# Patient Record
Sex: Female | Born: 1945
Health system: Southern US, Community
[De-identification: ages and names within clinical notes are randomized; demographics above are authoritative.]

## PROBLEM LIST (undated history)

## (undated) DIAGNOSIS — R0602 Shortness of breath: Secondary | ICD-10-CM

## (undated) DIAGNOSIS — F329 Major depressive disorder, single episode, unspecified: Secondary | ICD-10-CM

## (undated) DIAGNOSIS — R569 Unspecified convulsions: Secondary | ICD-10-CM

## (undated) DIAGNOSIS — R011 Cardiac murmur, unspecified: Secondary | ICD-10-CM

## (undated) DIAGNOSIS — K219 Gastro-esophageal reflux disease without esophagitis: Secondary | ICD-10-CM

## (undated) DIAGNOSIS — R112 Nausea with vomiting, unspecified: Secondary | ICD-10-CM

## (undated) DIAGNOSIS — T4145XA Adverse effect of unspecified anesthetic, initial encounter: Secondary | ICD-10-CM

## (undated) DIAGNOSIS — I2699 Other pulmonary embolism without acute cor pulmonale: Secondary | ICD-10-CM

## (undated) DIAGNOSIS — G709 Myoneural disorder, unspecified: Secondary | ICD-10-CM

## (undated) DIAGNOSIS — M1990 Unspecified osteoarthritis, unspecified site: Secondary | ICD-10-CM

## (undated) DIAGNOSIS — Z8719 Personal history of other diseases of the digestive system: Secondary | ICD-10-CM

## (undated) DIAGNOSIS — G629 Polyneuropathy, unspecified: Secondary | ICD-10-CM

## (undated) DIAGNOSIS — I82409 Acute embolism and thrombosis of unspecified deep veins of unspecified lower extremity: Secondary | ICD-10-CM

## (undated) DIAGNOSIS — G2581 Restless legs syndrome: Secondary | ICD-10-CM

## (undated) DIAGNOSIS — F32A Depression, unspecified: Secondary | ICD-10-CM

## (undated) DIAGNOSIS — T8859XA Other complications of anesthesia, initial encounter: Secondary | ICD-10-CM

## (undated) DIAGNOSIS — R52 Pain, unspecified: Secondary | ICD-10-CM

## (undated) DIAGNOSIS — Z9889 Other specified postprocedural states: Secondary | ICD-10-CM

## (undated) DIAGNOSIS — I739 Peripheral vascular disease, unspecified: Secondary | ICD-10-CM

## (undated) DIAGNOSIS — M419 Scoliosis, unspecified: Secondary | ICD-10-CM

## (undated) DIAGNOSIS — F419 Anxiety disorder, unspecified: Secondary | ICD-10-CM

## (undated) DIAGNOSIS — M722 Plantar fascial fibromatosis: Secondary | ICD-10-CM

## (undated) DIAGNOSIS — E785 Hyperlipidemia, unspecified: Secondary | ICD-10-CM

## (undated) HISTORY — PX: KNEE ARTHROSCOPY: SUR90

## (undated) HISTORY — PX: COLONOSCOPY: SHX174

## (undated) HISTORY — PX: JOINT REPLACEMENT: SHX530

## (undated) HISTORY — PX: OTHER SURGICAL HISTORY: SHX169

## (undated) HISTORY — PX: TUBAL LIGATION: SHX77

## (undated) HISTORY — PX: ABDOMINAL HYSTERECTOMY: SHX81

## (undated) HISTORY — PX: EYE SURGERY: SHX253

## (undated) HISTORY — PX: EXTERNAL EAR SURGERY: SHX627

---

## 1968-05-14 DIAGNOSIS — R011 Cardiac murmur, unspecified: Secondary | ICD-10-CM

## 1968-05-14 HISTORY — DX: Cardiac murmur, unspecified: R01.1

## 2006-05-14 DIAGNOSIS — I2699 Other pulmonary embolism without acute cor pulmonale: Secondary | ICD-10-CM

## 2006-05-14 DIAGNOSIS — I82409 Acute embolism and thrombosis of unspecified deep veins of unspecified lower extremity: Secondary | ICD-10-CM

## 2006-05-14 HISTORY — DX: Other pulmonary embolism without acute cor pulmonale: I26.99

## 2006-05-14 HISTORY — DX: Acute embolism and thrombosis of unspecified deep veins of unspecified lower extremity: I82.409

## 2012-01-23 DIAGNOSIS — R05 Cough: Secondary | ICD-10-CM | POA: Insufficient documentation

## 2012-01-23 DIAGNOSIS — F419 Anxiety disorder, unspecified: Secondary | ICD-10-CM | POA: Insufficient documentation

## 2012-01-23 DIAGNOSIS — E7800 Pure hypercholesterolemia, unspecified: Secondary | ICD-10-CM | POA: Insufficient documentation

## 2012-01-23 DIAGNOSIS — K219 Gastro-esophageal reflux disease without esophagitis: Secondary | ICD-10-CM

## 2012-01-23 DIAGNOSIS — F329 Major depressive disorder, single episode, unspecified: Secondary | ICD-10-CM | POA: Insufficient documentation

## 2012-01-23 DIAGNOSIS — K449 Diaphragmatic hernia without obstruction or gangrene: Secondary | ICD-10-CM | POA: Insufficient documentation

## 2012-02-12 ENCOUNTER — Other Ambulatory Visit: Payer: Self-pay | Admitting: Orthopedic Surgery

## 2012-02-12 MED ORDER — BUPIVACAINE 0.25 % ON-Q PUMP SINGLE CATH 300ML: 300.0000 mL | INJECTION | Status: DC

## 2012-02-12 MED ORDER — DEXAMETHASONE SODIUM PHOSPHATE 10 MG/ML IJ SOLN: 10.0000 mg | Freq: Once | INTRAMUSCULAR | Status: DC

## 2012-02-12 NOTE — Progress Notes (Signed)
Preoperative surgical orders have been place into the Epic hospital system for [facility] on 02/12/2012, 8:38 PM  by Patrica Duel for surgery on 04/09/12.  Preop Revision Total Knee orders including Bupivacaine On-Q pump, IV Tylenol, and IV Decadron as long as there are no contraindications to the above medications. Avel Peace, PA-C

## 2012-04-13 HISTORY — PX: NISSEN FUNDOPLICATION: SHX2091

## 2012-05-19 ENCOUNTER — Other Ambulatory Visit: Payer: Self-pay | Admitting: Orthopedic Surgery

## 2012-05-19 MED ORDER — BUPIVACAINE LIPOSOME 1.3 % IJ SUSP: 20.0000 mL | Freq: Once | INTRAMUSCULAR | Status: DC

## 2012-05-19 MED ORDER — DEXAMETHASONE SODIUM PHOSPHATE 10 MG/ML IJ SOLN: 10.0000 mg | Freq: Once | INTRAMUSCULAR | Status: DC

## 2012-05-19 NOTE — Progress Notes (Signed)
Preoperative surgical orders have been place into the Epic hospital system for [facility] on 05/19/2012, 11:07 AM  by Patrica Duel for surgery on 07/02/2012.  Preop Revision Total Knee orders including Experal, IV Tylenol, and IV Decadron as long as there are no contraindications to the above medications. Avel Peace, PA-C

## 2012-05-21 DIAGNOSIS — Z8719 Personal history of other diseases of the digestive system: Secondary | ICD-10-CM | POA: Insufficient documentation

## 2012-05-21 DIAGNOSIS — Z9889 Other specified postprocedural states: Secondary | ICD-10-CM

## 2012-06-23 ENCOUNTER — Encounter (HOSPITAL_COMMUNITY): Payer: Self-pay | Admitting: Pharmacy Technician

## 2012-06-25 ENCOUNTER — Encounter (HOSPITAL_COMMUNITY)
Admission: RE | Admit: 2012-06-25 | Discharge: 2012-06-25 | Disposition: A | Discharge status: Home / Self Care | Payer: Medicare Other | Source: Ambulatory Visit | Attending: Orthopedic Surgery | Admitting: Orthopedic Surgery

## 2012-06-25 ENCOUNTER — Encounter (HOSPITAL_COMMUNITY): Payer: Self-pay

## 2012-06-25 HISTORY — DX: Other complications of anesthesia, initial encounter: T88.59XA

## 2012-06-25 HISTORY — DX: Peripheral vascular disease, unspecified: I73.9

## 2012-06-25 HISTORY — DX: Other pulmonary embolism without acute cor pulmonale: I26.99

## 2012-06-25 HISTORY — DX: Myoneural disorder, unspecified: G70.9

## 2012-06-25 HISTORY — DX: Anxiety disorder, unspecified: F41.9

## 2012-06-25 HISTORY — DX: Adverse effect of unspecified anesthetic, initial encounter: T41.45XA

## 2012-06-25 HISTORY — DX: Other specified postprocedural states: Z98.890

## 2012-06-25 HISTORY — DX: Nausea with vomiting, unspecified: R11.2

## 2012-06-25 HISTORY — DX: Cardiac murmur, unspecified: R01.1

## 2012-06-25 HISTORY — DX: Unspecified osteoarthritis, unspecified site: M19.90

## 2012-06-25 HISTORY — DX: Acute embolism and thrombosis of unspecified deep veins of unspecified lower extremity: I82.409

## 2012-06-25 HISTORY — DX: Hyperlipidemia, unspecified: E78.5

## 2012-06-25 HISTORY — DX: Depression, unspecified: F32.A

## 2012-06-25 HISTORY — DX: Gastro-esophageal reflux disease without esophagitis: K21.9

## 2012-06-25 HISTORY — DX: Major depressive disorder, single episode, unspecified: F32.9

## 2012-06-25 HISTORY — DX: Personal history of other diseases of the digestive system: Z87.19

## 2012-06-25 HISTORY — DX: Unspecified convulsions: R56.9

## 2012-06-25 LAB — COMPREHENSIVE METABOLIC PANEL
ALT: 18 U/L (ref 0–35)
Alkaline Phosphatase: 89 U/L (ref 39–117)
BUN: 8 mg/dL (ref 6–23)
CO2: 29 mEq/L (ref 19–32)
Calcium: 9.1 mg/dL (ref 8.4–10.5)
GFR calc Af Amer: >90 mL/min (ref >90)
GFR calc non Af Amer: >90 mL/min (ref >90)
Glucose, Bld: 70 mg/dL (ref 70–99)
Sodium: 137 mEq/L (ref 135–145)

## 2012-06-25 LAB — CBC
HCT: 38.5 % (ref 36.0–46.0)
Hemoglobin: 12.2 g/dL (ref 12.0–15.0)
MCH: 28 pg (ref 26.0–34.0)
RBC: 4.35 MIL/uL (ref 3.87–5.11)

## 2012-06-25 LAB — SURGICAL PCR SCREEN
MRSA, PCR: NEGATIVE
Staphylococcus aureus: POSITIVE — AB

## 2012-06-25 LAB — URINALYSIS, ROUTINE W REFLEX MICROSCOPIC
Glucose, UA: NEGATIVE mg/dL
Hgb urine dipstick: NEGATIVE
Specific Gravity, Urine: 1.012 (ref 1.005–1.030)

## 2012-06-25 LAB — PROTIME-INR: Prothrombin Time: 12.1 seconds (ref 11.6–15.2)

## 2012-06-25 NOTE — Patient Instructions (Addendum)
20 Carrie Keller  06/25/2012   Your procedure is scheduled on:  07/02/12  [facility]  Report to [facility] Stay Center at 0600      AM.  Call this number if you have problems the morning of surgery: [phone]       Remember:   Do not eat food  Or drink :After Midnight. Tuesday NIGHT   Take these medicines the morning of surgery with A SIP OF WATER:  Cymbalta,   .  Contacts, dentures or partial plates can not be worn to surgery  Leave suitcase in the car. After surgery it may be brought to your room.  For patients admitted to the hospital, checkout time is 11:00 AM day of  discharge.             SPECIAL INSTRUCTIONS- SEE [facility] PREPARING FOR SURGERY INSTRUCTION SHEET-     DO NOT WEAR JEWELRY, LOTIONS, POWDERS, OR PERFUMES.  WOMEN-- DO NOT SHAVE LEGS OR UNDERARMS FOR 12 HOURS BEFORE SHOWERS. MEN MAY SHAVE FACE.  Patients discharged the day of surgery will not be allowed to drive home. IF going home the day of surgery, you must have a driver and someone to stay with you for the first 24 hours  Name and phone number of your driver: husband   BEN                                                                       Please read over the following fact sheets that you were given: MRSA Information, Incentive Spirometry Sheet, Blood Transfusion Sheet  Information                                                                                   Carrie Keller  PST 336  [phone]                 FAILURE TO FOLLOW THESE INSTRUCTIONS MAY RESULT IN  CANCELLATION   OF YOUR SURGERY                                                  Patient Signature _____________________________

## 2012-06-25 NOTE — Progress Notes (Signed)
Clearance 11/13 with EK 10/13  chart

## 2012-06-25 NOTE — Pre-Procedure Instructions (Signed)
PT'S MUPIROCIN PRESCRIPTION CALLED IN TO [facility] [place] [phone] PER PT'S REQUEST.

## 2012-06-29 ENCOUNTER — Other Ambulatory Visit: Payer: Self-pay | Admitting: Orthopedic Surgery

## 2012-06-29 NOTE — H&P (Signed)
Carrie Keller  DOB: 05/09/1946 Married / Language: English / Race: White Female  Date of Admission:  07/02/2012  Chief Complaint:  Unstable Right Total Knee Arthroplasty  History of Present Illness The patient is a 66 year old female who comes in for a preoperative History and Physical. The patient is scheduled for a right total knee arthroplasty to be performed by Dr. Frank V. Aluisio, MD at [facility] on 07/02/2012. The patient is a 66 year old female who presents for follow up of their knee. The patient is being followed for their bilateral painful total knees. Symptoms reported today include: pain. The patient feels that they are doing poorly and report their pain level to be moderate to severe. The following medication has been used for pain control: none (She had surgery in December and has not yet been allowed to take anything other than Tylenol which doesn't really help). The patient indicates that they are ready regarding total knee revision.  She states she has pain in both knees anytime she sits for a period of time. She also has difficulty getting up from a seated position. Unstable right total knee. The brace helped with some symptoms. She does not feel like she can go on with the brace forever. It has not completely gotten rid of her problems either. She said when she wears the brace she tends to do better. Without the brace the knee feels very unstable. The brace is very cumbersome and she would like to be in a situation where she would not need to use it anymore. She is ready to proceed with revision surgery. They have been treated conservatively in the past for the above stated problem and despite conservative measures, they continue to have progressive pain and severe functional limitations and dysfunction. They have failed non-operative management including home exercise, medications, and injections. It is felt that they would benefit from undergoing total joint  replacement. Risks and benefits of the procedure have been discussed with the patient and they elect to proceed with surgery. There are no active contraindications to surgery such as ongoing infection or rapidly progressive neurological disease.   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 seizures    Family History Cancer. mother and father Congestive Heart Failure. father Depression. mother and father Diabetes Mellitus. father Heart Disease. mother and father Heart disease in female family member before age 65 Kidney disease. father Osteoarthritis. father Rheumatoid Arthritis. mother   Social History Tobacco use. Former smoker. former smoker; uses 1 1/2 can(s) smokeless per week Alcohol use. current drinker; drinks wine and hard liquor; only occasionally per week Children. 1 Current work status. retired Drug/Alcohol Rehab (Currently). no Drug/Alcohol Rehab (Previously). no Exercise. Exercises weekly; does running / walking Illicit drug use. no Living situation. live with spouse Marital status. married Number of flights of stairs before winded. 1 Pain Contract. no Tobacco / smoke exposure. no Post-Surgical Plans. Plan is to go home.   Medication History Multivitamins ( Oral) Active. Calcium Carbonate ( Oral) Specific dose unknown - Active. Magnesium Oxide (400MG Capsule, Oral) Active. TraZODone HCl (50MG Tablet, Oral) Active. Estradiol (2MG Tablet, Oral) Active. Cymbalta (60MG Capsule DR Part, Oral) Active. Crestor (10MG Tablet, Oral) Active. Pramipexole Dihydrochloride (1MG Tablet, Oral) Active. QUEtiapine Fumarate (50MG Tablet, Oral) Active.   Past Surgical History Arthroscopy of Knee. right Carpal Tunnel Repair. bilateral Total Knee Replacement. bilateral Tubal Ligation Surgery for Bell's Palsy Abdominal Surgery. Abdominoplasty Nissen Fudoplication   Medical  History   Anxiety Disorder Blood Clot. DVT and Pulm. Emboli Chronic Pain Depression Gastroesophageal Reflux Disease Hypercholesterolemia Osteoarthritis Diverticulosis   Review of Systems General:Not Present- Chills, Fever, Night Sweats, Fatigue, Weight Gain, Weight Loss and Memory Loss. Skin:Not Present- Hives, Itching, Rash, Eczema and Lesions. HEENT:Not Present- Tinnitus, Headache, Double Vision, Visual Loss, Hearing Loss and Dentures. Respiratory:Not Present- Shortness of breath with exertion, Shortness of breath at rest, Allergies, Coughing up blood and Chronic Cough. Cardiovascular:Present- Leg Cramps. Not Present- Shortness of Breath, Chest Pain, Swelling of Extremities, Racing/skipping heartbeats, Difficulty Breathing Lying Down, Murmur, Swelling and Palpitations. Gastrointestinal:Present- Heartburn. Not Present- Bloody Stool, Abdominal Pain, Vomiting, Nausea, Constipation, Diarrhea, Difficulty Swallowing, Incontinence of Stool, Jaundice and Loss of appetitie. Female Genitourinary:Not Present- Blood in Urine, Urinary frequency, Weak urinary stream, Discharge, Flank Pain, Incontinence, Painful Urination, Urgency, Urinary Retention and Urinating at Night. Musculoskeletal:Present- Joint Pain. Not Present- Muscle Weakness, Muscle Pain, Joint Stiffness, Joint Swelling, Back Pain, Morning Stiffness and Spasms. Neurological:Present- Headaches. Not Present- Tingling, Numbness, Burning, Tremor, Dizziness, Blackout spells, Paralysis, Difficulty with balance and Weakness. Psychiatric:Present- Anxiety. Not Present- Depression, Memory Loss and Insomnia. Endocrine:Present- Heat Intolerance. Not Present- Cold Intolerance, Excessive hunger and Excessive Thirst. Hematology:Present- Blood Clots. Not Present- Abnormal Bleeding, Anemia and Easy Bruising.   Vitals Weight: 169 lb Height: 61.5 in Weight was reported by patient. Height was reported by patient. Body Surface Area: 1.82  m Body Mass Index: 31.41 kg/m Pulse: 96 (Regular) Resp.: 14 (Unlabored) BP: 122/78 (Sitting, Right Arm, Standard)    Physical Exam The physical exam findings are as follows:  Note: Patient is a 66 year old female with continued knee pain.   General Mental Status - Alert, cooperative and good historian. General Appearance- pleasant. Not in acute distress. Orientation- Oriented X3. Build & Nutrition- Well nourished and Well developed.   Head and Neck Head- normocephalic, atraumatic . Neck Global Assessment- supple. no bruit auscultated on the right and no bruit auscultated on the left.   Eye Pupil- Bilateral- Regular and Round. Motion- Bilateral- EOMI.   Chest and Lung Exam Auscultation: Breath sounds:- clear at anterior chest wall and - clear at posterior chest wall. Adventitious sounds:- No Adventitious sounds.   Cardiovascular Auscultation:Rhythm- Regular rate and rhythm. Heart Sounds- S1 WNL and S2 WNL. Murmurs & Other Heart Sounds:Auscultation of the heart reveals - No Murmurs.   Abdomen Palpation/Percussion:Tenderness- Abdomen is non-tender to palpation. Rigidity (guarding)- Abdomen is soft. Auscultation:Auscultation of the abdomen reveals - Bowel sounds normal.   Female Genitourinary Not done, not pertinent to present illness  Musculoskeletal Well developed female alert and oriented in no apparent distress. Right knee shows range about 5 to 110. She has significant laxity in extension as well as significant AP laxity in 90 degrees of flexion. No specific tenderness. Her left knee range 0 to 120. No swelling, tenderness or instability.  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 Aluisio.  Plan is to go home.  PCP - Dr. Brad Thomas ([place], )  Signed electronically by DREW L Jyasia Markoff, PA-C  

## 2012-07-01 NOTE — Anesthesia Preprocedure Evaluation (Addendum)
Anesthesia Evaluation  Patient identified by MRN, date of birth, ID band Patient awake    Reviewed: Allergy & Precautions, H&P , NPO status , Patient's Chart, lab work & pertinent test results  History of Anesthesia Complications (+) PONV  Airway Mallampati: II TM Distance: >3 FB Neck ROM: full    Dental no notable dental hx. (+) Teeth Intact and Dental Advisory Given   Pulmonary neg pulmonary ROS,  Hx. PE breath sounds clear to auscultation  Pulmonary exam normal       Cardiovascular Exercise Tolerance: Good negative cardio ROS  + Valvular Problems/Murmurs MVP Rhythm:regular Rate:Normal     Neuro/Psych negative neurological ROS  negative psych ROS   GI/Hepatic negative GI ROS, Neg liver ROS,   Endo/Other  negative endocrine ROS  Renal/GU negative Renal ROS  negative genitourinary   Musculoskeletal   Abdominal   Peds  Hematology negative hematology ROS (+)   Anesthesia Other Findings   Reproductive/Obstetrics negative OB ROS                          Anesthesia Physical Anesthesia Plan  ASA: II  Anesthesia Plan: General   Post-op Pain Management:    Induction: Intravenous  Airway Management Planned: Oral ETT  Additional Equipment:   Intra-op Plan:   Post-operative Plan: Extubation in OR  Informed Consent: I have reviewed the patients History and Physical, chart, labs and discussed the procedure including the risks, benefits and alternatives for the proposed anesthesia with the patient or authorized representative who has indicated his/her understanding and acceptance.   Dental Advisory Given  Plan Discussed with: CRNA and Surgeon  Anesthesia Plan Comments:        Anesthesia Quick Evaluation

## 2012-07-02 ENCOUNTER — Encounter (HOSPITAL_COMMUNITY): Payer: Self-pay | Admitting: Anesthesiology

## 2012-07-02 ENCOUNTER — Encounter (HOSPITAL_COMMUNITY): Payer: Self-pay | Admitting: *Deleted

## 2012-07-02 ENCOUNTER — Inpatient Hospital Stay (HOSPITAL_COMMUNITY)
Admission: RE | Admit: 2012-07-02 | Discharge: 2012-07-04 | DRG: 468 | Disposition: A | Discharge status: Home Health | Payer: Medicare Other | Source: Ambulatory Visit | Attending: Orthopedic Surgery | Admitting: Orthopedic Surgery

## 2012-07-02 ENCOUNTER — Inpatient Hospital Stay (HOSPITAL_COMMUNITY): Payer: Medicare Other | Admitting: Anesthesiology

## 2012-07-02 ENCOUNTER — Encounter (HOSPITAL_COMMUNITY)
Admission: RE | Disposition: A | Discharge status: Home Health | Payer: Self-pay | Source: Ambulatory Visit | Attending: Orthopedic Surgery

## 2012-07-02 DIAGNOSIS — Z86711 Personal history of pulmonary embolism: Secondary | ICD-10-CM

## 2012-07-02 DIAGNOSIS — T84018A Broken internal joint prosthesis, other site, initial encounter: Secondary | ICD-10-CM

## 2012-07-02 DIAGNOSIS — E7800 Pure hypercholesterolemia, unspecified: Secondary | ICD-10-CM | POA: Diagnosis present

## 2012-07-02 DIAGNOSIS — T84099A Other mechanical complication of unspecified internal joint prosthesis, initial encounter: Principal | ICD-10-CM | POA: Diagnosis present

## 2012-07-02 DIAGNOSIS — I739 Peripheral vascular disease, unspecified: Secondary | ICD-10-CM | POA: Diagnosis present

## 2012-07-02 DIAGNOSIS — F329 Major depressive disorder, single episode, unspecified: Secondary | ICD-10-CM | POA: Diagnosis present

## 2012-07-02 DIAGNOSIS — Z96659 Presence of unspecified artificial knee joint: Secondary | ICD-10-CM

## 2012-07-02 DIAGNOSIS — F3289 Other specified depressive episodes: Secondary | ICD-10-CM | POA: Diagnosis present

## 2012-07-02 DIAGNOSIS — Z79899 Other long term (current) drug therapy: Secondary | ICD-10-CM

## 2012-07-02 DIAGNOSIS — Y831 Surgical operation with implant of artificial internal device as the cause of abnormal reaction of the patient, or of later complication, without mention of misadventure at the time of the procedure: Secondary | ICD-10-CM | POA: Diagnosis present

## 2012-07-02 DIAGNOSIS — D62 Acute posthemorrhagic anemia: Secondary | ICD-10-CM

## 2012-07-02 DIAGNOSIS — F411 Generalized anxiety disorder: Secondary | ICD-10-CM | POA: Diagnosis present

## 2012-07-02 DIAGNOSIS — K219 Gastro-esophageal reflux disease without esophagitis: Secondary | ICD-10-CM | POA: Diagnosis present

## 2012-07-02 DIAGNOSIS — Z86718 Personal history of other venous thrombosis and embolism: Secondary | ICD-10-CM

## 2012-07-02 HISTORY — PX: TOTAL KNEE REVISION: SHX996

## 2012-07-02 LAB — TYPE AND SCREEN: ABO/RH(D): O POS

## 2012-07-02 SURGERY — TOTAL KNEE REVISION
Anesthesia: Spinal | Site: Knee | Laterality: Right | Wound class: Clean

## 2012-07-02 MED ORDER — SODIUM CHLORIDE 0.9 % IJ SOLN
INTRAMUSCULAR | Status: DC | PRN
  Administered 2012-07-02: 50 mL

## 2012-07-02 MED ORDER — LACTATED RINGERS IV SOLN
INTRAVENOUS | Status: DC | PRN
  Administered 2012-07-02 (×2): via INTRAVENOUS

## 2012-07-02 MED ORDER — PRAMIPEXOLE DIHYDROCHLORIDE 1 MG PO TABS
1.0000 mg | ORAL_TABLET | Freq: Every day | ORAL | Status: DC
  Administered 2012-07-02 – 2012-07-03 (×2): 1 mg via ORAL
  Filled 2012-07-02 (×3): qty 1

## 2012-07-02 MED ORDER — DEXAMETHASONE SODIUM PHOSPHATE 10 MG/ML IJ SOLN
INTRAMUSCULAR | Status: DC | PRN
  Administered 2012-07-02: 10 mg via INTRAVENOUS

## 2012-07-02 MED ORDER — BUPIVACAINE LIPOSOME 1.3 % IJ SUSP
20.0000 mL | Freq: Once | INTRAMUSCULAR | Status: AC
  Administered 2012-07-02: 20 mL
  Filled 2012-07-02: qty 20

## 2012-07-02 MED ORDER — SODIUM CHLORIDE 0.9 % IR SOLN
Status: DC | PRN
  Administered 2012-07-02: 1000 mL

## 2012-07-02 MED ORDER — QUETIAPINE FUMARATE 50 MG PO TABS
50.0000 mg | ORAL_TABLET | Freq: Every day | ORAL | Status: DC
  Administered 2012-07-02 – 2012-07-03 (×2): 50 mg via ORAL
  Filled 2012-07-02 (×3): qty 1

## 2012-07-02 MED ORDER — ONDANSETRON HCL 4 MG/2ML IJ SOLN: 4.0000 mg | Freq: Four times a day (QID) | INTRAMUSCULAR | Status: DC | PRN

## 2012-07-02 MED ORDER — BISACODYL 10 MG RE SUPP: 10.0000 mg | Freq: Every day | RECTAL | Status: DC | PRN

## 2012-07-02 MED ORDER — ACETAMINOPHEN 650 MG RE SUPP: 650.0000 mg | Freq: Four times a day (QID) | RECTAL | Status: DC | PRN

## 2012-07-02 MED ORDER — SUCCINYLCHOLINE CHLORIDE 20 MG/ML IJ SOLN
INTRAMUSCULAR | Status: DC | PRN
  Administered 2012-07-02: 100 mg via INTRAVENOUS

## 2012-07-02 MED ORDER — SODIUM CHLORIDE 0.9 % IV SOLN: INTRAVENOUS | Status: DC

## 2012-07-02 MED ORDER — CEFAZOLIN SODIUM 1-5 GM-% IV SOLN
1.0000 g | Freq: Four times a day (QID) | INTRAVENOUS | Status: AC
  Administered 2012-07-02 (×2): 1 g via INTRAVENOUS
  Filled 2012-07-02 (×2): qty 50

## 2012-07-02 MED ORDER — DEXTROSE-NACL 5-0.9 % IV SOLN
INTRAVENOUS | Status: DC
  Administered 2012-07-02 – 2012-07-03 (×2): via INTRAVENOUS

## 2012-07-02 MED ORDER — DIPHENHYDRAMINE HCL 12.5 MG/5ML PO ELIX: 12.5000 mg | ORAL_SOLUTION | ORAL | Status: DC | PRN

## 2012-07-02 MED ORDER — MORPHINE SULFATE 2 MG/ML IJ SOLN
1.0000 mg | INTRAMUSCULAR | Status: DC | PRN
  Administered 2012-07-02: 1 mg via INTRAVENOUS
  Administered 2012-07-02 – 2012-07-03 (×2): 2 mg via INTRAVENOUS
  Filled 2012-07-02 (×3): qty 1

## 2012-07-02 MED ORDER — FENTANYL CITRATE 0.05 MG/ML IJ SOLN
INTRAMUSCULAR | Status: DC | PRN
  Administered 2012-07-02 (×5): 50 ug via INTRAVENOUS
  Administered 2012-07-02: 100 ug via INTRAVENOUS
  Administered 2012-07-02 (×3): 50 ug via INTRAVENOUS

## 2012-07-02 MED ORDER — HYDROMORPHONE HCL PF 1 MG/ML IJ SOLN: 0.5000 mg | INTRAMUSCULAR | Status: DC | PRN

## 2012-07-02 MED ORDER — POLYETHYLENE GLYCOL 3350 17 G PO PACK
17.0000 g | PACK | Freq: Every day | ORAL | Status: DC
  Administered 2012-07-02: 17 g via ORAL

## 2012-07-02 MED ORDER — POLYETHYLENE GLYCOL 3350 17 G PO PACK
17.0000 g | PACK | Freq: Every day | ORAL | Status: DC | PRN
  Administered 2012-07-03: 17 g via ORAL

## 2012-07-02 MED ORDER — ACETAMINOPHEN 10 MG/ML IV SOLN
1000.0000 mg | Freq: Four times a day (QID) | INTRAVENOUS | Status: AC
  Administered 2012-07-02 – 2012-07-03 (×4): 1000 mg via INTRAVENOUS
  Filled 2012-07-02 (×6): qty 100

## 2012-07-02 MED ORDER — DOCUSATE SODIUM 100 MG PO CAPS
100.0000 mg | ORAL_CAPSULE | Freq: Two times a day (BID) | ORAL | Status: DC
  Administered 2012-07-02 – 2012-07-03 (×3): 100 mg via ORAL

## 2012-07-02 MED ORDER — PROPOFOL 10 MG/ML IV BOLUS
INTRAVENOUS | Status: DC | PRN
  Administered 2012-07-02: 150 mg via INTRAVENOUS

## 2012-07-02 MED ORDER — METHOCARBAMOL 500 MG PO TABS
500.0000 mg | ORAL_TABLET | Freq: Four times a day (QID) | ORAL | Status: DC | PRN
  Administered 2012-07-02 – 2012-07-04 (×6): 500 mg via ORAL
  Filled 2012-07-02 (×6): qty 1

## 2012-07-02 MED ORDER — DULOXETINE HCL 60 MG PO CPEP
60.0000 mg | ORAL_CAPSULE | Freq: Every day | ORAL | Status: DC
Start: 2012-07-02 — End: 2012-07-04
  Administered 2012-07-02 – 2012-07-03 (×2): 60 mg via ORAL
  Filled 2012-07-02 (×3): qty 1

## 2012-07-02 MED ORDER — 0.9 % SODIUM CHLORIDE (POUR BTL) OPTIME
TOPICAL | Status: DC | PRN
  Administered 2012-07-02: 1000 mL

## 2012-07-02 MED ORDER — DULOXETINE HCL 30 MG PO CPEP
30.0000 mg | ORAL_CAPSULE | Freq: Every day | ORAL | Status: DC
  Administered 2012-07-03 – 2012-07-04 (×2): 30 mg via ORAL
  Filled 2012-07-02 (×3): qty 1

## 2012-07-02 MED ORDER — ONDANSETRON HCL 4 MG/2ML IJ SOLN
INTRAMUSCULAR | Status: DC | PRN
  Administered 2012-07-02: 4 mg via INTRAVENOUS

## 2012-07-02 MED ORDER — DEXAMETHASONE SODIUM PHOSPHATE 10 MG/ML IJ SOLN: 10.0000 mg | Freq: Once | INTRAMUSCULAR | Status: AC

## 2012-07-02 MED ORDER — LACTATED RINGERS IV SOLN: INTRAVENOUS | Status: DC

## 2012-07-02 MED ORDER — ONDANSETRON HCL 4 MG PO TABS: 4.0000 mg | ORAL_TABLET | Freq: Four times a day (QID) | ORAL | Status: DC | PRN

## 2012-07-02 MED ORDER — METHOCARBAMOL 100 MG/ML IJ SOLN
500.0000 mg | Freq: Four times a day (QID) | INTRAVENOUS | Status: DC | PRN
  Administered 2012-07-02: 500 mg via INTRAVENOUS
  Filled 2012-07-02: qty 5

## 2012-07-02 MED ORDER — HYDROMORPHONE HCL PF 1 MG/ML IJ SOLN
0.2500 mg | INTRAMUSCULAR | Status: DC | PRN
  Administered 2012-07-02 (×6): 0.5 mg via INTRAVENOUS

## 2012-07-02 MED ORDER — FLEET ENEMA 7-19 GM/118ML RE ENEM: 1.0000 | ENEMA | Freq: Once | RECTAL | Status: AC | PRN

## 2012-07-02 MED ORDER — ACETAMINOPHEN 10 MG/ML IV SOLN
1000.0000 mg | Freq: Once | INTRAVENOUS | Status: AC
  Administered 2012-07-02: 1000 mg via INTRAVENOUS

## 2012-07-02 MED ORDER — RIVAROXABAN 10 MG PO TABS
10.0000 mg | ORAL_TABLET | Freq: Every day | ORAL | Status: DC
  Administered 2012-07-03 – 2012-07-04 (×2): 10 mg via ORAL
  Filled 2012-07-02 (×3): qty 1

## 2012-07-02 MED ORDER — METOCLOPRAMIDE HCL 10 MG PO TABS: 5.0000 mg | ORAL_TABLET | Freq: Three times a day (TID) | ORAL | Status: DC | PRN

## 2012-07-02 MED ORDER — PHENOL 1.4 % MT LIQD: 1.0000 | OROMUCOSAL | Status: DC | PRN

## 2012-07-02 MED ORDER — ACETAMINOPHEN 325 MG PO TABS: 650.0000 mg | ORAL_TABLET | Freq: Four times a day (QID) | ORAL | Status: DC | PRN

## 2012-07-02 MED ORDER — CISATRACURIUM BESYLATE (PF) 10 MG/5ML IV SOLN
INTRAVENOUS | Status: DC | PRN
  Administered 2012-07-02: 4 mg via INTRAVENOUS

## 2012-07-02 MED ORDER — HYDROMORPHONE HCL PF 1 MG/ML IJ SOLN
INTRAMUSCULAR | Status: AC
  Filled 2012-07-02: qty 1

## 2012-07-02 MED ORDER — METOCLOPRAMIDE HCL 5 MG/ML IJ SOLN: 5.0000 mg | Freq: Three times a day (TID) | INTRAMUSCULAR | Status: DC | PRN

## 2012-07-02 MED ORDER — DEXAMETHASONE 6 MG PO TABS
10.0000 mg | ORAL_TABLET | Freq: Once | ORAL | Status: AC
  Administered 2012-07-03: 10 mg via ORAL
  Filled 2012-07-02: qty 1

## 2012-07-02 MED ORDER — MIDAZOLAM HCL 5 MG/5ML IJ SOLN
INTRAMUSCULAR | Status: DC | PRN
  Administered 2012-07-02: 2 mg via INTRAVENOUS

## 2012-07-02 MED ORDER — MENTHOL 3 MG MT LOZG: 1.0000 | LOZENGE | OROMUCOSAL | Status: DC | PRN

## 2012-07-02 MED ORDER — TRAZODONE HCL 50 MG PO TABS
50.0000 mg | ORAL_TABLET | Freq: Every day | ORAL | Status: DC
  Administered 2012-07-02 – 2012-07-03 (×2): 50 mg via ORAL
  Filled 2012-07-02 (×3): qty 1

## 2012-07-02 MED ORDER — ATORVASTATIN CALCIUM 20 MG PO TABS
20.0000 mg | ORAL_TABLET | Freq: Every day | ORAL | Status: DC
  Administered 2012-07-02 – 2012-07-03 (×2): 20 mg via ORAL
  Filled 2012-07-02 (×3): qty 1

## 2012-07-02 MED ORDER — CEFAZOLIN SODIUM-DEXTROSE 2-3 GM-% IV SOLR
2.0000 g | INTRAVENOUS | Status: AC
  Administered 2012-07-02: 2 g via INTRAVENOUS

## 2012-07-02 MED ORDER — OXYCODONE HCL 5 MG PO TABS
5.0000 mg | ORAL_TABLET | ORAL | Status: DC | PRN
  Administered 2012-07-02 – 2012-07-04 (×11): 10 mg via ORAL
  Filled 2012-07-02 (×12): qty 2

## 2012-07-02 SURGICAL SUPPLY — 73 items
ADAPTER BOLT FEMORAL +2/-2 (Knees) ×2 IMPLANT
BAG ZIPLOCK 12X15 (MISCELLANEOUS) ×2 IMPLANT
BANDAGE ELASTIC 6 VELCRO ST LF (GAUZE/BANDAGES/DRESSINGS) ×2 IMPLANT
BANDAGE ESMARK 6X9 LF (GAUZE/BANDAGES/DRESSINGS) ×2 IMPLANT
BLADE SAG 18X100X1.27 (BLADE) ×2 IMPLANT
BLADE SAW SGTL 11.0X1.19X90.0M (BLADE) ×2 IMPLANT
BNDG ESMARK 6X9 LF (GAUZE/BANDAGES/DRESSINGS) ×4
BONE CEMENT GENTAMICIN (Cement) ×6 IMPLANT
CATH KIT ON-Q SILVERSOAK 5IN (CATHETERS) IMPLANT
CEMENT BONE GENTAMICIN 40 (Cement) ×3 IMPLANT
CEMENT RESTRICTOR DEPUY SZ 3 (Cement) ×2 IMPLANT
CLOTH BEACON ORANGE TIMEOUT ST (SAFETY) ×2 IMPLANT
CONT SPECI 4OZ STER CLIK (MISCELLANEOUS) IMPLANT
CUFF TOURN SGL QUICK 34 (TOURNIQUET CUFF) ×1
CUFF TRNQT CYL 34X4X40X1 (TOURNIQUET CUFF) ×1 IMPLANT
DRAPE EXTREMITY T 121X128X90 (DRAPE) ×2 IMPLANT
DRAPE POUCH INSTRU U-SHP 10X18 (DRAPES) ×2 IMPLANT
DRAPE U-SHAPE 47X51 STRL (DRAPES) ×2 IMPLANT
DRSG ADAPTIC 3X8 NADH LF (GAUZE/BANDAGES/DRESSINGS) ×2 IMPLANT
DRSG PAD ABDOMINAL 8X10 ST (GAUZE/BANDAGES/DRESSINGS) ×2 IMPLANT
DURAPREP 26ML APPLICATOR (WOUND CARE) ×2 IMPLANT
ELECT REM PT RETURN 9FT ADLT (ELECTROSURGICAL) ×2
ELECTRODE REM PT RTRN 9FT ADLT (ELECTROSURGICAL) ×1 IMPLANT
EVACUATOR 1/8 PVC DRAIN (DRAIN) ×2 IMPLANT
FACESHIELD LNG OPTICON STERILE (SAFETY) ×10 IMPLANT
FEM TC3 RT PFC SIGMA SZ2.5 (Orthopedic Implant) ×2 IMPLANT
FEMORAL ADAPTER (Orthopedic Implant) ×2 IMPLANT
FEMORAL TC3 RT PFC SIGMA SZ2.5 (Orthopedic Implant) ×1 IMPLANT
GLOVE BIO SURGEON STRL SZ7.5 (GLOVE) ×2 IMPLANT
GLOVE BIO SURGEON STRL SZ8 (GLOVE) ×2 IMPLANT
GLOVE BIOGEL PI IND STRL 8 (GLOVE) ×2 IMPLANT
GLOVE BIOGEL PI INDICATOR 8 (GLOVE) ×2
GLOVE SURG SS PI 6.5 STRL IVOR (GLOVE) ×4 IMPLANT
GOWN STRL NON-REIN LRG LVL3 (GOWN DISPOSABLE) ×4 IMPLANT
GOWN STRL REIN XL XLG (GOWN DISPOSABLE) ×4 IMPLANT
HANDPIECE INTERPULSE COAX TIP (DISPOSABLE) ×1
IMMOBILIZER KNEE 20 (SOFTGOODS) ×2
IMMOBILIZER KNEE 20 THIGH 36 (SOFTGOODS) ×1 IMPLANT
INSERT TC3 RP TIB  2.5 17.5MM (Knees) ×1 IMPLANT
INSERT TC3 RP TIB 2.5 17.5MM (Knees) ×1 IMPLANT
KIT BASIN OR (CUSTOM PROCEDURE TRAY) ×2 IMPLANT
MANIFOLD NEPTUNE II (INSTRUMENTS) ×2 IMPLANT
NDL SAFETY ECLIPSE 18X1.5 (NEEDLE) ×1 IMPLANT
NEEDLE HYPO 18GX1.5 SHARP (NEEDLE) ×1
NS IRRIG 1000ML POUR BTL (IV SOLUTION) ×2 IMPLANT
PACK TOTAL JOINT (CUSTOM PROCEDURE TRAY) ×2 IMPLANT
PAD ABD 7.5X8 STRL (GAUZE/BANDAGES/DRESSINGS) ×2 IMPLANT
PADDING CAST COTTON 6X4 STRL (CAST SUPPLIES) ×2 IMPLANT
POSITIONER SURGICAL ARM (MISCELLANEOUS) ×2 IMPLANT
POST AUG PFC 4MM SZ 2.5 (Knees) ×4 IMPLANT
SET HNDPC FAN SPRY TIP SCT (DISPOSABLE) ×1 IMPLANT
SPONGE GAUZE 4X4 12PLY (GAUZE/BANDAGES/DRESSINGS) ×2 IMPLANT
STAPLER VISISTAT 35W (STAPLE) IMPLANT
STEM TIBIA PFC 13X30MM (Stem) ×2 IMPLANT
STEM UNIVERSAL REVISION 75X14 (Stem) ×2 IMPLANT
STRIP CLOSURE SKIN 1/2X4 (GAUZE/BANDAGES/DRESSINGS) ×2 IMPLANT
SUCTION FRAZIER 12FR DISP (SUCTIONS) ×2 IMPLANT
SUT MNCRL AB 4-0 PS2 18 (SUTURE) ×2 IMPLANT
SUT VIC AB 2-0 CT1 27 (SUTURE) ×3
SUT VIC AB 2-0 CT1 TAPERPNT 27 (SUTURE) ×3 IMPLANT
SUT VLOC 180 0 24IN GS25 (SUTURE) ×2 IMPLANT
SWAB COLLECTION DEVICE MRSA (MISCELLANEOUS) IMPLANT
SYR 50ML LL SCALE MARK (SYRINGE) ×2 IMPLANT
TOWEL OR 17X26 10 PK STRL BLUE (TOWEL DISPOSABLE) ×4 IMPLANT
TOWER CARTRIDGE SMART MIX (DISPOSABLE) ×2 IMPLANT
TRAY FOLEY CATH 14FRSI W/METER (CATHETERS) ×2 IMPLANT
TRAY REVISION SZ 2.5 (Knees) ×2 IMPLANT
TRAY SLEEVE CEM ML (Knees) ×2 IMPLANT
TUBE ANAEROBIC SPECIMEN COL (MISCELLANEOUS) IMPLANT
WATER STERILE IRR 1500ML POUR (IV SOLUTION) ×4 IMPLANT
WEDGE STEP 2.5 10MM (Knees) ×4 IMPLANT
WEDGE SZ 2.0MM (Knees) ×2 IMPLANT
WRAP KNEE MAXI GEL POST OP (GAUZE/BANDAGES/DRESSINGS) ×2 IMPLANT

## 2012-07-02 NOTE — Brief Op Note (Signed)
07/02/2012  10:03 AM  PATIENT:  Carrie Keller  67 y.o. female  PRE-OPERATIVE DIAGNOSIS:  unstable right total knee arthroplasty  POST-OPERATIVE DIAGNOSIS:  unstable right total knee arthroplasty  PROCEDURE:  Procedure(s): TOTAL KNEE REVISION (Right)  SURGEON:  Surgeon(s) and Role:    Loanne Drilling, MD - Primary  PHYSICIAN ASSISTANT:   ASSISTANTS: Avel Peace, PA-C   ANESTHESIA:   general  EBL:  Total I/O In: 1000 [I.V.:1000] Out: 300 [Urine:100; Blood:200]  BLOOD ADMINISTERED:none  DRAINS: (right knee) Hemovact drain(s) in the right knee with  Suction Open   LOCAL MEDICATIONS USED:  OTHER Exparel  COUNTS:  YES  TOURNIQUET:  Up 40 minutes @ 300 mm Hg; down 8 minutes; up 22 minutes @ 300 mm Hg  DICTATION: .Other Dictation: Dictation Number [id number]  PLAN OF CARE: Admit to inpatient   PATIENT DISPOSITION:  PACU - hemodynamically stable.

## 2012-07-02 NOTE — Evaluation (Signed)
Physical Therapy Evaluation Patient Details Name: Carrie Keller MRN: [id number] DOB: [birth year] Today's Date: 07/02/2012 Time: 2130-8657 PT Time Calculation (min): 35 min  PT Assessment / Plan / Recommendation Clinical Impression  Pt s/p R TKR revision presents with decreased R LE strength/ROM and post op pain limiting functional mobility    PT Assessment  Patient needs continued PT services    Follow Up Recommendations  Home health PT    Does the patient have the potential to tolerate intense rehabilitation      Barriers to Discharge None      Equipment Recommendations  Rolling walker with 5" wheels (pt is 5'1.5")    Recommendations for Other Services OT consult   Frequency 7X/week    Precautions / Restrictions Precautions Precautions: Knee Required Braces or Orthoses: Knee Immobilizer - Right (Pt performed IND SLR this date) Knee Immobilizer - Right: Discontinue once straight leg raise with < 10 degree lag Restrictions Weight Bearing Restrictions: No Other Position/Activity Restrictions: WBAT   Pertinent Vitals/Pain 5/10; premed; ice packs provided      Mobility  Bed Mobility Bed Mobility: Supine to Sit Supine to Sit: 4: Min assist Details for Bed Mobility Assistance: cues for use of L LE to self assist Transfers Transfers: Sit to Stand;Stand to Sit Sit to Stand: 4: Min assist;From bed;With upper extremity assist Stand to Sit: 4: Min assist;With upper extremity assist;To chair/3-in-1 Details for Transfer Assistance: cues for LE management and use of UEs to self assist Ambulation/Gait Ambulation/Gait Assistance: 4: Min assist Ambulation Distance (Feet): 31 Feet Assistive device: Rolling walker Ambulation/Gait Assistance Details: cues for posture, sequence and position from RW Gait Pattern: Step-to pattern    Exercises Total Joint Exercises Ankle Circles/Pumps: AROM;10 reps;Supine;Both Quad Sets: AROM;10 reps;Supine;Both Heel Slides: AAROM;10  reps;Supine;Right Straight Leg Raises: AROM;10 reps;Supine;Right   PT Diagnosis: Difficulty walking  PT Problem List: Decreased strength;Decreased range of motion;Decreased activity tolerance;Decreased mobility;Decreased knowledge of use of DME;Pain PT Treatment Interventions: DME instruction;Gait training;Stair training;Functional mobility training;Therapeutic activities;Therapeutic exercise;Patient/family education   PT Goals Acute Rehab PT Goals PT Goal Formulation: With patient Time For Goal Achievement: 07/07/12 Potential to Achieve Goals: Good Pt will go Supine/Side to Sit: with supervision PT Goal: Supine/Side to Sit - Progress: Goal set today Pt will go Sit to Supine/Side: with supervision PT Goal: Sit to Supine/Side - Progress: Goal set today Pt will go Sit to Stand: with supervision PT Goal: Sit to Stand - Progress: Goal set today Pt will go Stand to Sit: with supervision PT Goal: Stand to Sit - Progress: Goal set today Pt will Ambulate: 51 - 150 feet;with supervision;with rolling walker PT Goal: Ambulate - Progress: Goal set today  Visit Information  Last PT Received On: 07/02/12 Assistance Needed: +1    Subjective Data  Subjective: I'd like to try to get up Patient Stated Goal: Resume previous lifestyle with decreased pain   Prior Functioning  Home Living Lives With: Spouse Available Help at Discharge: Family Type of Home: House Home Access: Level entry Home Adaptive Equipment: None Prior Function Level of Independence: Independent Able to Take Stairs?: Yes Driving: Yes Vocation: Retired Musician: No difficulties Dominant Hand: Right    Cognition  Cognition Overall Cognitive Status: Appears within functional limits for tasks assessed/performed Arousal/Alertness: Awake/alert Orientation Level: Appears intact for tasks assessed Behavior During Session: [facility] for tasks performed    Extremity/Trunk Assessment Right Upper Extremity  Assessment RUE ROM/Strength/Tone: [facility] for tasks assessed Left Upper Extremity Assessment LUE ROM/Strength/Tone: [facility] for tasks assessed Right  Lower Extremity Assessment RLE ROM/Strength/Tone: Deficits RLE ROM/Strength/Tone Deficits: 3/5 quads with AAROM at knee -10 - 70 Left Lower Extremity Assessment LLE ROM/Strength/Tone: WFL for tasks assessed   Balance    End of Session PT - End of Session Equipment Utilized During Treatment: Gait belt Activity Tolerance: Patient tolerated treatment well Patient left: in chair;with call bell/phone within reach Nurse Communication: Mobility status  GP     Chanta Bauers 07/02/2012, 5:01 PM

## 2012-07-02 NOTE — Interval H&P Note (Signed)
History and Physical Interval Note:  07/02/2012 8:02 AM  Carrie Keller  has presented today for surgery, with the diagnosis of unstable right total knee arthroplasty  The various methods of treatment have been discussed with the patient and family. After consideration of risks, benefits and other options for treatment, the patient has consented to  Procedure(s): TOTAL KNEE REVISION (Right) as a surgical intervention .  The patient's history has been reviewed, patient examined, no change in status, stable for surgery.  I have reviewed the patient's chart and labs.  Questions were answered to the patient's satisfaction.     Loanne Drilling

## 2012-07-02 NOTE — Anesthesia Postprocedure Evaluation (Signed)
  Anesthesia Post-op Note  Patient: Carrie Keller  Procedure(s) Performed: Procedure(s) (LRB): TOTAL KNEE REVISION (Right)  Patient Location: PACU  Anesthesia Type: General  Level of Consciousness: awake and alert   Airway and Oxygen Therapy: Patient Spontanous Breathing  Post-op Pain: mild  Post-op Assessment: Post-op Vital signs reviewed, Patient's Cardiovascular Status Stable, Respiratory Function Stable, Patent Airway and No signs of Nausea or vomiting  Last Vitals:  Filed Vitals:   07/02/12 1115  BP: 147/72  Pulse: 98  Temp: 36.8 C  Resp: 23    Post-op Vital Signs: stable   Complications: No apparent anesthesia complications

## 2012-07-02 NOTE — H&P (View-Only) (Signed)
Carrie Keller  DOB: [birth year] Married / Language: English / Race: White Female  Date of Admission:  07/02/2012  Chief Complaint:  Unstable Right Total Knee Arthroplasty  History of Present Illness The patient is a 67 year old female who comes in for a preoperative History and Physical. The patient is scheduled for a right total knee arthroplasty to be performed by Dr. Gus Rankin. Aluisio, MD at [facility] on 07/02/2012. The patient is a 67 year old female who presents for follow up of their knee. The patient is being followed for their bilateral painful total knees. Symptoms reported today include: pain. The patient feels that they are doing poorly and report their pain level to be moderate to severe. The following medication has been used for pain control: none (She had surgery in December and has not yet been allowed to take anything other than Tylenol which doesn't really help). The patient indicates that they are ready regarding total knee revision.  She states she has pain in both knees anytime she sits for a period of time. She also has difficulty getting up from a seated position. Unstable right total knee. The brace helped with some symptoms. She does not feel like she can go on with the brace forever. It has not completely gotten rid of her problems either. She said when she wears the brace she tends to do better. Without the brace the knee feels very unstable. The brace is very cumbersome and she would like to be in a situation where she would not need to use it anymore. She is ready to proceed with revision surgery. They have been treated conservatively in the past for the above stated problem and despite conservative measures, they continue to have progressive pain and severe functional limitations and dysfunction. They have failed non-operative management including home exercise, medications, and injections. It is felt that they would benefit from undergoing total joint  replacement. Risks and benefits of the procedure have been discussed with the patient and they elect to proceed with surgery. There are no active contraindications to surgery such as ongoing infection or rapidly progressive neurological disease.   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 seizures    Family History Cancer. mother and father Congestive Heart Failure. father Depression. mother and father Diabetes Mellitus. father Heart Disease. mother and father Heart disease in female family member before age 50 Kidney disease. father Osteoarthritis. father Rheumatoid Arthritis. mother   Social History Tobacco use. Former smoker. former smoker; uses 1 1/2 can(s) smokeless per week Alcohol use. current drinker; drinks wine and hard liquor; only occasionally per week Children. 1 Current work status. retired Financial planner (Currently). no Drug/Alcohol Rehab (Previously). no Exercise. Exercises weekly; does running / walking Illicit drug use. no Living situation. live with spouse Marital status. married Number of flights of stairs before winded. 1 Pain Contract. no Tobacco / smoke exposure. no Post-Surgical Plans. Plan is to go home.   Medication History Multivitamins ( Oral) Active. Calcium Carbonate ( Oral) Specific dose unknown - Active. Magnesium Oxide (400MG  Capsule, Oral) Active. TraZODone HCl (50MG  Tablet, Oral) Active. Estradiol (2MG  Tablet, Oral) Active. Cymbalta (60MG  Capsule DR Part, Oral) Active. Crestor (10MG  Tablet, Oral) Active. Pramipexole Dihydrochloride (1MG  Tablet, Oral) Active. QUEtiapine Fumarate (50MG  Tablet, Oral) Active.   Past Surgical History Arthroscopy of Knee. right Carpal Tunnel Repair. bilateral Total Knee Replacement. bilateral Tubal Ligation Surgery for Bell's Palsy Abdominal Surgery. Abdominoplasty Nissen Fudoplication   Medical  History  Anxiety Disorder Blood Clot. DVT and Pulm. Emboli Chronic Pain Depression Gastroesophageal Reflux Disease Hypercholesterolemia Osteoarthritis Diverticulosis   Review of Systems General:Not Present- Chills, Fever, Night Sweats, Fatigue, Weight Gain, Weight Loss and Memory Loss. Skin:Not Present- Hives, Itching, Rash, Eczema and Lesions. HEENT:Not Present- Tinnitus, Headache, Double Vision, Visual Loss, Hearing Loss and Dentures. Respiratory:Not Present- Shortness of breath with exertion, Shortness of breath at rest, Allergies, Coughing up blood and Chronic Cough. Cardiovascular:Present- Leg Cramps. Not Present- Shortness of Breath, Chest Pain, Swelling of Extremities, Racing/skipping heartbeats, Difficulty Breathing Lying Down, Murmur, Swelling and Palpitations. Gastrointestinal:Present- Heartburn. Not Present- Bloody Stool, Abdominal Pain, Vomiting, Nausea, Constipation, Diarrhea, Difficulty Swallowing, Incontinence of Stool, Jaundice and Loss of appetitie. Female Genitourinary:Not Present- Blood in Urine, Urinary frequency, Weak urinary stream, Discharge, Flank Pain, Incontinence, Painful Urination, Urgency, Urinary Retention and Urinating at Night. Musculoskeletal:Present- Joint Pain. Not Present- Muscle Weakness, Muscle Pain, Joint Stiffness, Joint Swelling, Back Pain, Morning Stiffness and Spasms. Neurological:Present- Headaches. Not Present- Tingling, Numbness, Burning, Tremor, Dizziness, Blackout spells, Paralysis, Difficulty with balance and Weakness. Psychiatric:Present- Anxiety. Not Present- Depression, Memory Loss and Insomnia. Endocrine:Present- Heat Intolerance. Not Present- Cold Intolerance, Excessive hunger and Excessive Thirst. Hematology:Present- Blood Clots. Not Present- Abnormal Bleeding, Anemia and Easy Bruising.   Vitals Weight: 169 lb Height: 61.5 in Weight was reported by patient. Height was reported by patient. Body Surface Area: 1.82  m Body Mass Index: 31.41 kg/m Pulse: 96 (Regular) Resp.: 14 (Unlabored) BP: 122/78 (Sitting, Right Arm, Standard)    Physical Exam The physical exam findings are as follows:  Note: Patient is a 67 year old female with continued knee pain.   General Mental Status - Alert, cooperative and good historian. General Appearance- pleasant. Not in acute distress. Orientation- Oriented X3. Build & Nutrition- Well nourished and Well developed.   Head and Neck Head- normocephalic, atraumatic . Neck Global Assessment- supple. no bruit auscultated on the right and no bruit auscultated on the left.   Eye Pupil- Bilateral- Regular and Round. Motion- Bilateral- EOMI.   Chest and Lung Exam Auscultation: Breath sounds:- clear at anterior chest wall and - clear at posterior chest wall. Adventitious sounds:- No Adventitious sounds.   Cardiovascular Auscultation:Rhythm- Regular rate and rhythm. Heart Sounds- S1 WNL and S2 WNL. Murmurs & Other Heart Sounds:Auscultation of the heart reveals - No Murmurs.   Abdomen Palpation/Percussion:Tenderness- Abdomen is non-tender to palpation. Rigidity (guarding)- Abdomen is soft. Auscultation:Auscultation of the abdomen reveals - Bowel sounds normal.   Female Genitourinary Not done, not pertinent to present illness  Musculoskeletal Well developed female alert and oriented in no apparent distress. Right knee shows range about 5 to 110. She has significant laxity in extension as well as significant AP laxity in 90 degrees of flexion. No specific tenderness. Her left knee range 0 to 120. No swelling, tenderness or instability.  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 Lequita Halt.  Plan is to go home.  PCP - Dr. Konrad Felix ([place], )  Signed electronically by Roberts Gaudy, PA-C

## 2012-07-02 NOTE — Op Note (Signed)
Carrie Keller, J[place] NO.:  [id number]  MEDICAL RECORD NO.:  [id number]  LOCATION:  1617                         FACILITY:  [facility]  PHYSICIAN:  Ollen Gross, M.D.    DATE OF BIRTH:  Feb 28, 1946  DATE OF PROCEDURE: DATE OF DISCHARGE:                              OPERATIVE REPORT   PREOPERATIVE DIAGNOSIS:  Unstable right total knee arthroplasty.  POSTOPERATIVE DIAGNOSIS:  Unstable right total knee arthroplasty.  PROCEDURE:  Right total knee arthroplasty revision.  SURGEON:  Ollen Gross, MD  ASSISTANT:  Alexzandrew L. Perkins, PA-C  ANESTHESIA:  General.  ESTIMATED BLOOD LOSS:  Minimal.  DRAINS:  Hemovac x1.  TOURNIQUET TIME:  Forty minutes at 300 mmHg, down 8 minutes, up additional 22 minutes at 300 mmHg.  COMPLICATIONS:  None.  CONDITION:  Stable to recovery room.  BRIEF CLINICAL NOTE:  Carrie Keller, a 67 year old female, right total knee arthroplasty done several years ago.  She has gone on to develop pain and instability.  She has tried a brace which helped minimally.  She presents now for total knee arthroplasty revision.  PROCEDURE IN DETAIL:  After successful administration of general anesthetic, and exam under anesthesia, it was performed.  She has gross laxity, varus and valgus, directions in full extension and gross laxity AP and 90 degrees of flexion.  We then placed a tourniquet around her right thigh and her right lower extremity was prepped and draped in usual sterile fashion.  The right lower extremity is wrapped in Esmarch, knee flexed, and tourniquet inflated to 300 mmHg.  A midline incision was made with a 10 blade to the subcutaneous tissue to the level of the extensor mechanism.  A fresh blade was used to make a medial parapatellar arthrotomy.  Minimal fluid is encountered in the joint. Soft tissue on the proximal medial tibia subperiosteally elevated to the joint line with a knife and into the semimembranosus bursa with a  Cobb elevator.  Soft tissue laterally was elevated with attention being paid to avoid the patellar tendon on tibial tubercle.  The patella was subluxed laterally, knee flexed 90 degrees.  I removed the tibial polyethylene from the tibial tray.  I cut the cone off of the polyethylene in order to remove it.  The tibia was then subluxed forward and retractors placed circumferentially.  A saw was used to disrupt the interface between the tibial component and bone of the components easily removed with essentially no bone loss.  The fatty cement was then removed from the diaphysis.  We reamed up to 13 mm on the tibia canal after thoroughly irrigating the canal.  I then placed the extramedullary tibial alignment guide, referencing proximally at the medial aspect of tibial tubercle and distally along the second metatarsal axis and tibial crest.  The block was pinned to remove about 2 mm from the previously cut bone surface.  Cut was made with an oscillating saw.  The surface will accommodate a size 2.5 MBT revision tray.  We then did a proximal reaming for the tray.  I decided we needed to put 10 mm augments on each side to raise the joint line at the appropriate level.  We then prepared for a 29 mm sleeve and also prepared with the keel punch for rotational control.  I then removed the femoral component by disrupting the interface between the metal and bone.  There was a posterior stabilized Multimedia programmer.  The femur was removed with minimal of any bone loss.  We then drilled into the femoral canal and thoroughly irrigated the canal to remove fatty elements.  The reaming was performed up to 14 mm, which had an excellent press-fit.  The reamer was left in place to serve as our intramedullary cutting guide.  The 5-degree right valgus alignment guide was placed.  We removed 2 mm off the distal femur, both medial and lateral.  No need for augments distally.  Size 2.5  was most appropriate femoral component.  The size 2.5 cutting block was placed, rotation was marked at the epicondylar axis confirmed by creating rectangular flexion gap at 90 degrees.  Block was pinned in this rotation.  The anterior and posterior chamfer cuts were made. Intercondylar blocks placed and that cuts made with TC3 component.  I felt that we needed to put 4 mm posterior augments in order to tighten up our flexion gap.  It was slightly larger than the extension gap.  We placed the trial which was a size 2.5 posterior stabilized TC3 femur with a 4 mm medial and lateral posterior augment and a 14 x 75 stem extension in a +2 to 5 degree right valgus position.  We had excellent purchase on the femur with this.  I placed a 15 mm trial and there was a little bit of AP poured with that so went to 17.5, which allowed for full extension with excellent varus-valgus and anterior-posterior balance throughout full range of motion.  The patella was covered with tissue and I did a patelloplasty to remove the tissue and get back to normal tendon.  Patella component appears to be in good position and it has no wear.  It was thus left intact.  We just did a patelloplasty to remove the tissue.  The tourniquet was then released and held down for 8 minutes while the components were assembled on the back table.  There was minimal bleeding and that which was present was stopped with electrocautery.  After 8 minutes and after the components were assembled, then we rewrapped the leg in Esmarch and reinflated to 300 mmHg.  The trials were then removed and we trialed for cement restrictor on the tibia which is a size 3. Size 3 cement restrictor was placed at the appropriate depth in the tibial canal.  The wound was then copiously irrigated with saline solution.  Cement was mixed.  Once ready for implantation, it was injected into the tibial canal and tibial components cemented into place and impacted.   This was size 2.5 MBT revision tray, 10 mm medial and lateral augments and a 29 mm sleeve.  All extruded cement was removed. On the femoral side, we cemented distally and press-fit the stem.  It is a size 2.5 PC3 femur, 4 mm posterior augments medial and lateral, and a 14 x 75 stem extension in a +2 to 5 degree right valgus position.  The 17.5 trial inserts placed, knee held in full extension.  All extruded cement removed.  When the cement was fully hardened, then the permanent 17.5 mm TC3 rotating platform insert was placed into the tibial tray. The wound was then copiously irrigated with saline solution.  The Exparel  which is 20 mL mixed with 50 mL of saline was injected into the extensor mechanism, subcu tissues periosteum, and posterior tissues. The wound was again irrigated and a limb 100 Hemovac drain placed deep into the joint and other places the subcu.  The extensor mechanism was then closed with running #1 V-Loc suture.  Flexion against gravity is 135 degrees.  The tourniquet was released for a second tourniquet time of 22 minutes.  Subcu was then closed with interrupted 2-0 Vicryl, and subcuticular running 4-0 Monocryl.  Drain was hooked to suction. Incision cleaned and dried and a bulky sterile dressing applied.  She was then placed into a knee immobilizer, awakened, and transported to recovery in stable condition.  Please note that a surgical assistant was a medical necessity for this procedure in order to perform it in a safe and expeditious manner. Surgical assistant was necessary for appropriate retraction to protect the vital neurovascular structures and ligaments while removing the prosthesis and then preparing for the new prosthesis as well as implanting the new prosthesis.     Ollen Gross, M.D.     FA/MEDQ  D:  07/02/2012  T:  07/02/2012  Job:  [id number]

## 2012-07-02 NOTE — Transfer of Care (Signed)
Immediate Anesthesia Transfer of Care Note  Patient: Carrie Keller  Procedure(s) Performed: Procedure(s): TOTAL KNEE REVISION (Right)  Patient Location: PACU  Anesthesia Type:General  Level of Consciousness: awake, alert , oriented and patient cooperative  Airway & Oxygen Therapy: Patient Spontanous Breathing and Patient connected to face mask oxygen  Post-op Assessment: Report given to PACU RN and Post -op Vital signs reviewed and stable  Post vital signs: Reviewed and stable  Complications: No apparent anesthesia complications

## 2012-07-02 NOTE — Progress Notes (Signed)
Utilization review completed.  

## 2012-07-02 NOTE — Preoperative (Signed)
Beta Blockers   Reason not to administer Beta Blockers:Not Applicable 

## 2012-07-03 ENCOUNTER — Encounter (HOSPITAL_COMMUNITY): Payer: Self-pay | Admitting: Orthopedic Surgery

## 2012-07-03 LAB — BASIC METABOLIC PANEL
CO2: 25 mEq/L (ref 19–32)
Chloride: 105 mEq/L (ref 96–112)
Sodium: 139 mEq/L (ref 135–145)

## 2012-07-03 LAB — CBC
Hemoglobin: 9.9 g/dL — ABNORMAL LOW (ref 12.0–15.0)
MCV: 90 fL (ref 78.0–100.0)
Platelets: 244 10*3/uL (ref 150–400)
RBC: 3.5 MIL/uL — ABNORMAL LOW (ref 3.87–5.11)
WBC: 13.2 10*3/uL — ABNORMAL HIGH (ref 4.0–10.5)

## 2012-07-03 NOTE — Progress Notes (Signed)
Physical Therapy Treatment Patient Details Name: Carrie Keller MRN: [id number] DOB: May 17, 1945 Today's Date: 07/03/2012 Time: 4098-1191 PT Time Calculation (min): 29 min  PT Assessment / Plan / Recommendation Comments on Treatment Session       Follow Up Recommendations  Home health PT     Does the patient have the potential to tolerate intense rehabilitation     Barriers to Discharge        Equipment Recommendations  Rolling walker with 5" wheels    Recommendations for Other Services OT consult  Frequency 7X/week   Plan Discharge plan remains appropriate    Precautions / Restrictions Precautions Precautions: Knee Knee Immobilizer - Right: Discontinue once straight leg raise with < 10 degree lag Restrictions Weight Bearing Restrictions: No Other Position/Activity Restrictions: WBAT   Pertinent Vitals/Pain 5/10; premed, cold packs provided    Mobility  Bed Mobility Bed Mobility: Supine to Sit Supine to Sit: 4: Min guard Details for Bed Mobility Assistance: cues for use of L LE to self assist Transfers Transfers: Sit to Stand;Stand to Sit Sit to Stand: 4: Min guard Stand to Sit: 4: Min guard Details for Transfer Assistance: cues for LE management and use of UEs to self assist Ambulation/Gait Ambulation/Gait Assistance: 4: Min guard Ambulation Distance (Feet): 147 Feet Assistive device: Rolling walker Ambulation/Gait Assistance Details: min cues for posture and position from RW Gait Pattern: Step-to pattern    Exercises Total Joint Exercises Ankle Circles/Pumps: AROM;Supine;Both;20 reps Quad Sets: AROM;Supine;Both;15 reps Heel Slides: AAROM;Supine;Right;15 reps Straight Leg Raises: AROM;Supine;Right;15 reps   PT Diagnosis:    PT Problem List:   PT Treatment Interventions:     PT Goals Acute Rehab PT Goals PT Goal Formulation: With patient Time For Goal Achievement: 07/07/12 Potential to Achieve Goals: Good Pt will go Supine/Side to Sit: with supervision PT  Goal: Supine/Side to Sit - Progress: Progressing toward goal Pt will go Sit to Supine/Side: with supervision PT Goal: Sit to Supine/Side - Progress: Progressing toward goal Pt will go Sit to Stand: with supervision PT Goal: Sit to Stand - Progress: Progressing toward goal Pt will go Stand to Sit: with supervision PT Goal: Stand to Sit - Progress: Progressing toward goal Pt will Ambulate: 51 - 150 feet;with supervision;with rolling walker PT Goal: Ambulate - Progress: Progressing toward goal  Visit Information  Last PT Received On: 07/03/12 Assistance Needed: +1    Subjective Data  Subjective: If I do well I can go home tomorrow Patient Stated Goal: Resume previous lifestyle with decreased pain   Cognition  Cognition Overall Cognitive Status: Appears within functional limits for tasks assessed/performed Arousal/Alertness: Awake/alert Orientation Level: Appears intact for tasks assessed Behavior During Session: [facility] for tasks performed    Balance     End of Session PT - End of Session Equipment Utilized During Treatment: Gait belt Activity Tolerance: Patient tolerated treatment well Patient left: in chair;with call bell/phone within reach Nurse Communication: Mobility status CPM Right Knee CPM Right Knee: Off   GP     Carrie Keller 07/03/2012, 12:18 PM

## 2012-07-03 NOTE — Progress Notes (Signed)
Physical Therapy Treatment Patient Details Name: Carrie Keller MRN: [id number] DOB: 15-Sep-1945 Today's Date: 07/03/2012 Time: 1478-2956 PT Time Calculation (min): 21 min  PT Assessment / Plan / Recommendation Comments on Treatment Session       Follow Up Recommendations  Home health PT     Does the patient have the potential to tolerate intense rehabilitation     Barriers to Discharge        Equipment Recommendations  Rolling walker with 5" wheels    Recommendations for Other Services OT consult  Frequency 7X/week   Plan Discharge plan remains appropriate    Precautions / Restrictions Precautions Precautions: Knee Required Braces or Orthoses: Knee Immobilizer - Right Knee Immobilizer - Right: Discontinue once straight leg raise with < 10 degree lag Restrictions Weight Bearing Restrictions: No Other Position/Activity Restrictions: WBAT   Pertinent Vitals/Pain 5-6/10 - premed, RN aware    Mobility  Bed Mobility Bed Mobility: Sit to Supine Sit to Supine: 4: Min guard Details for Bed Mobility Assistance: cues for use of L LE to self assist Transfers Transfers: Sit to Stand;Stand to Sit Sit to Stand: 5: Supervision Stand to Sit: 5: Supervision Details for Transfer Assistance: min cues for LE management and hand placement. Ambulation/Gait Ambulation/Gait Assistance: 4: Min guard;5: Supervision Ambulation Distance (Feet): 187 Feet (and 20) Assistive device: Rolling walker Ambulation/Gait Assistance Details: cues for posture and position from RW Gait Pattern: Step-to pattern    Exercises     PT Diagnosis:    PT Problem List:   PT Treatment Interventions:     PT Goals Acute Rehab PT Goals PT Goal Formulation: With patient Time For Goal Achievement: 07/07/12 Potential to Achieve Goals: Good Pt will go Supine/Side to Sit: with supervision PT Goal: Supine/Side to Sit - Progress: Progressing toward goal Pt will go Sit to Supine/Side: with supervision PT Goal: Sit to  Supine/Side - Progress: Progressing toward goal Pt will go Sit to Stand: with supervision PT Goal: Sit to Stand - Progress: Progressing toward goal Pt will go Stand to Sit: with supervision PT Goal: Stand to Sit - Progress: Progressing toward goal Pt will Ambulate: 51 - 150 feet;with supervision;with rolling walker PT Goal: Ambulate - Progress: Progressing toward goal  Visit Information  Last PT Received On: 07/03/12 Assistance Needed: +1    Subjective Data  Subjective: If I do well I can go home tomorrow Patient Stated Goal: Resume previous lifestyle with decreased pain   Cognition  Cognition Overall Cognitive Status: Appears within functional limits for tasks assessed/performed Arousal/Alertness: Awake/alert Orientation Level: Appears intact for tasks assessed Behavior During Session: [facility] for tasks performed    Balance     End of Session PT - End of Session Equipment Utilized During Treatment: Gait belt Activity Tolerance: Patient tolerated treatment well Patient left: in bed;with call bell/phone within reach Nurse Communication: Mobility status CPM Right Knee CPM Right Knee: Off   GP     Carrie Keller 07/03/2012, 2:50 PM

## 2012-07-03 NOTE — Progress Notes (Signed)
   Subjective: 1 Day Post-Op Procedure(s) (LRB): TOTAL KNEE REVISION (Right) Patient reports pain as mild.   Patient seen in rounds with Dr. Lequita Halt.  Patient is sitting up in bed doing great this morning. Patient is well, and has had no acute complaints or problems We will start therapy today.  Plan is to go Home after hospital stay.  Objective: Vital signs in last 24 hours: Temp:  [97.2 F (36.2 C)-98.5 F (36.9 C)] 97.6 F (36.4 C) (02/20 0702) Pulse Rate:  [76-111] 79 (02/20 0702) Resp:  [12-23] 14 (02/20 0702) BP: (111-168)/(68-81) 121/78 mmHg (02/20 0702) SpO2:  [92 %-100 %] 99 % (02/20 0702) Weight:  [77.111 kg (170 lb)] 77.111 kg (170 lb) (02/19 1212)  Intake/Output from previous day:  Intake/Output Summary (Last 24 hours) at 07/03/12 0802 Last data filed at 07/03/12 0706  Gross per 24 hour  Intake 3992.5 ml  Output   2240 ml  Net 1752.5 ml    Intake/Output this shift: Total I/O In: 395 [I.V.:395] Out: 415 [Urine:350; Drains:65]  Labs:  Recent Labs  07/03/12 0521  HGB 9.9*    Recent Labs  07/03/12 0521  WBC 13.2*  RBC 3.50*  HCT 31.5*  PLT 244    Recent Labs  07/03/12 0521  NA 139  K 4.3  CL 105  CO2 25  BUN 9  CREATININE 0.59  GLUCOSE 150*  CALCIUM 8.0*   No results found for this basename: LABPT, INR,  in the last 72 hours  EXAM General - Patient is Alert, Appropriate and Oriented Extremity - Neurovascular intact Sensation intact distally Dorsiflexion/Plantar flexion intact Dressing - dressing C/D/I Motor Function - intact, moving foot and toes well on exam.  Hemovac still draining so left in place for now.  Past Medical History  Diagnosis Date  . Complication of anesthesia   . PONV (postoperative nausea and vomiting)   . Heart murmur 1970    MVP/ asympotmatic  . Depression   . Anxiety     sees Dr Carolin Guernsey  psychiatrist every 3 month  . DVT (deep venous thrombosis) 2008    RIGHT /POST OP  . Pulmonary embolism 2008  .  H/O hiatal hernia   . GERD (gastroesophageal reflux disease)   . Seizures     last seizure 1967- states reaction to promethazine  . Arthritis   . Peripheral vascular disease     venous insufficiency/ bilateral legs  . 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 Hyperlipidemia     Assessment/Plan: 1 Day Post-Op Procedure(s) (LRB): TOTAL KNEE REVISION (Right) Principal Problem:   Failed total knee arthroplasty  Estimated body mass index is 31.6 kg/(m^2) as calculated from the following:   Height as of this encounter: 5' 1.5" (1.562 m).   Weight as of this encounter: 77.111 kg (170 lb). Advance diet Up with therapy Plan for discharge tomorrow if doing well  DVT Prophylaxis - Xarelto Weight-Bearing as tolerated to right leg No vaccines. D/C O2 and Pulse OX and try on Room Air  Dasani Crear 07/03/2012, 8:02 AM

## 2012-07-03 NOTE — Evaluation (Signed)
Occupational Therapy Evaluation Patient Details Name: Carrie Keller MRN: [id number] DOB: 1945-12-10 Today's Date: 07/03/2012 Time: 2130-8657 OT Time Calculation (min): 19 min  OT Assessment / Plan / Recommendation Clinical Impression  Pt doing well POD 1 RTKR. All education completed.    OT Assessment  Patient does not need any further OT services    Follow Up Recommendations  No OT follow up    Barriers to Discharge      Equipment Recommendations  3 in 1 bedside comode    Recommendations for Other Services    Frequency       Precautions / Restrictions Precautions Precautions: Knee Required Braces or Orthoses: Knee Immobilizer - Right Knee Immobilizer - Right: Discontinue once straight leg raise with < 10 degree lag Restrictions Weight Bearing Restrictions: No Other Position/Activity Restrictions: WBAT   Pertinent Vitals/Pain Pt reported 8/10 pain with activity. Repositioned and cold applied.    ADL  Grooming: Min guard Where Assessed - Grooming: Supported standing Upper Body Bathing: Set up Where Assessed - Upper Body Bathing: Unsupported sitting Lower Body Bathing: Minimal assistance Where Assessed - Lower Body Bathing: Supported sit to stand Upper Body Dressing: Set up Where Assessed - Upper Body Dressing: Unsupported sitting Lower Body Dressing: Minimal assistance Where Assessed - Lower Body Dressing: Supported sit to stand Toilet Transfer: Min Pension scheme manager Method: Sit to Barista: Raised toilet seat with arms (or 3-in-1 over toilet) Toileting - Clothing Manipulation and Hygiene: Min guard Where Assessed - Engineer, mining and Hygiene: Sit to stand from 3-in-1 or toilet Tub/Shower Transfer: Min guard Tub/Shower Transfer Method: Ambulating Equipment Used: Rolling walker ADL Comments: Educated pt how to safely step into and out of shower with good return demo. Pt stated family would be available to assist with  bathing/dressing.    OT Diagnosis:    OT Problem List:   OT Treatment Interventions:     OT Goals    Visit Information  Last OT Received On: 07/03/12 Assistance Needed: +1    Subjective Data  Subjective: I had my last sx 84yrs ago. Patient Stated Goal: Not asked.   Prior Functioning     Home Living Lives With: Spouse Available Help at Discharge: Family Type of Home: House Home Access: Level entry Home Layout: One level Bathroom Shower/Tub: Health visitor: Standard Home Adaptive Equipment: None Prior Function Level of Independence: Independent Able to Take Stairs?: Yes Driving: Yes Vocation: Retired Musician: No difficulties Dominant Hand: Right         Vision/Perception     Copywriter, advertising Overall Cognitive Status: Appears within functional limits for tasks assessed/performed Arousal/Alertness: Awake/alert Orientation Level: Appears intact for tasks assessed Behavior During Session: [facility] for tasks performed    Extremity/Trunk Assessment Right Upper Extremity Assessment RUE ROM/Strength/Tone: [facility] for tasks assessed Left Upper Extremity Assessment LUE ROM/Strength/Tone: WFL for tasks assessed     Mobility  Transfers Sit to Stand: 4: Min guard Stand to Sit: 4: Min guard Details for Transfer Assistance: min cues for LE management and hand placement.     Exercise    Balance     End of Session OT - End of Session Activity Tolerance: Patient tolerated treatment well Patient left: in chair;with call bell/phone within reach CPM Right Knee CPM Right Knee: Off  GO     Nathyn Luiz A OTR/L [id number] 07/03/2012, 12:28 PM

## 2012-07-04 LAB — BASIC METABOLIC PANEL
BUN: 11 mg/dL (ref 6–23)
CO2: 27 mEq/L (ref 19–32)
Chloride: 103 mEq/L (ref 96–112)
Creatinine, Ser: 0.53 mg/dL (ref 0.50–1.10)
Glucose, Bld: 151 mg/dL — ABNORMAL HIGH (ref 70–99)

## 2012-07-04 LAB — CBC
HCT: 27.9 % — ABNORMAL LOW (ref 36.0–46.0)
MCV: 89.1 fL (ref 78.0–100.0)
RBC: 3.13 MIL/uL — ABNORMAL LOW (ref 3.87–5.11)
WBC: 12.5 10*3/uL — ABNORMAL HIGH (ref 4.0–10.5)

## 2012-07-04 MED ORDER — OXYCODONE HCL 5 MG PO TABS: 5.0000 mg | ORAL_TABLET | ORAL | Status: DC | PRN

## 2012-07-04 MED ORDER — METHOCARBAMOL 500 MG PO TABS: 500.0000 mg | ORAL_TABLET | Freq: Four times a day (QID) | ORAL | Status: DC | PRN

## 2012-07-04 MED ORDER — RIVAROXABAN 10 MG PO TABS: 10.0000 mg | ORAL_TABLET | Freq: Every day | ORAL | Status: DC

## 2012-07-04 NOTE — Progress Notes (Signed)
Physical Therapy Treatment Patient Details Name: Carrie Keller MRN: [id number] DOB: 1945-12-30 Today's Date: 07/04/2012 Time: 4098-1191 PT Time Calculation (min): 26 min  PT Assessment / Plan / Recommendation Comments on Treatment Session       Follow Up Recommendations  Home health PT     Does the patient have the potential to tolerate intense rehabilitation     Barriers to Discharge        Equipment Recommendations  Rolling walker with 5" wheels    Recommendations for Other Services OT consult  Frequency 7X/week   Plan Discharge plan remains appropriate    Precautions / Restrictions Precautions Precautions: Knee Required Braces or Orthoses: Knee Immobilizer - Right Knee Immobilizer - Right: Discontinue once straight leg raise with < 10 degree lag (pt performed IND SLR this am) Restrictions Weight Bearing Restrictions: No Other Position/Activity Restrictions: WBAT   Pertinent Vitals/Pain     Mobility  Bed Mobility Details for Bed Mobility Assistance: cues for use of L LE to self assist Transfers Transfers: Sit to Stand;Stand to Sit Sit to Stand: 5: Supervision Stand to Sit: 5: Supervision Details for Transfer Assistance: min cues for LE management and hand placement. Ambulation/Gait Ambulation/Gait Assistance: 4: Min guard;5: Supervision Ambulation Distance (Feet): 159 Feet Assistive device: Rolling walker Ambulation/Gait Assistance Details: min cues for position from RW Gait Pattern: Step-to pattern;Step-through pattern    Exercises Total Joint Exercises Ankle Circles/Pumps: AROM;Supine;Both;20 reps Quad Sets: AROM;Supine;Both;20 reps Heel Slides: AAROM;Supine;Right;20 reps Straight Leg Raises: AROM;Supine;Right;20 reps Long Arc Quad: AROM;Both;10 reps;Supine   PT Diagnosis:    PT Problem List:   PT Treatment Interventions:     PT Goals Acute Rehab PT Goals PT Goal Formulation: With patient Time For Goal Achievement: 07/07/12 Potential to Achieve Goals:  Good Pt will go Supine/Side to Sit: with supervision PT Goal: Supine/Side to Sit - Progress: Met Pt will go Sit to Supine/Side: with supervision PT Goal: Sit to Supine/Side - Progress: Met Pt will go Sit to Stand: with supervision PT Goal: Sit to Stand - Progress: Met Pt will go Stand to Sit: with supervision PT Goal: Stand to Sit - Progress: Met Pt will Ambulate: 51 - 150 feet;with supervision;with rolling walker PT Goal: Ambulate - Progress: Met  Visit Information  Last PT Received On: 07/04/12 Assistance Needed: +1    Subjective Data  Subjective: I am leaving today Patient Stated Goal: Resume previous lifestyle with decreased pain   Cognition  Cognition Overall Cognitive Status: Appears within functional limits for tasks assessed/performed Arousal/Alertness: Awake/alert Orientation Level: Appears intact for tasks assessed Behavior During Session: [facility] for tasks performed    Balance     End of Session PT - End of Session Activity Tolerance: Patient tolerated treatment well Patient left: in chair;with call bell/phone within reach Nurse Communication: Mobility status   GP     Carrie Keller 07/04/2012, 12:36 PM

## 2012-07-04 NOTE — Care Management Note (Signed)
    Page 1 of 2   07/04/2012     12:23:12 PM   CARE MANAGEMENT NOTE 07/04/2012  Patient:  Carrie Keller, Carrie Keller   Account Number:  [id number]  Date Initiated:  07/04/2012  Documentation initiated by:  Colleen Can  Subjective/Objective Assessment:   dx revision total knee arthroplasty-right     Action/Plan:   CM spoke with patient. Plans are for her to return to her home in [facility] where her sister will be caregiver. She will need RW and 3n1. Genevieve Norlander will provide HHpt services   Anticipated DC Date:  07/04/2012   Anticipated DC Plan:  HOME W HOME HEALTH SERVICES      DC Planning Services  CM consult      PAC Choice  DURABLE MEDICAL EQUIPMENT  HOME HEALTH   Choice offered to / List presented to:  C-1 Patient   DME arranged  3-N-1  Levan Hurst      DME agency  [facility]     HH arranged  HH-2 PT      [facility] agency  [facility]   Status of service:  Completed, signed off Medicare Important Message given?  NA - LOS <3 / Initial given by admissions (If response is "NO", the following Medicare IM given date fields will be blank) Date Medicare IM given:   Date Additional Medicare IM given:    Discharge Disposition:    Per UR Regulation:    If discussed at Long Length of Stay Meetings, dates discussed:    Comments:  07/04/2012 Colleen Can BSN RN CCM [phone] Aundra Millet will start hh services 07/05/2012. DME has been delivered to patient's room.

## 2012-07-04 NOTE — Clinical Documentation Improvement (Signed)
Anemia Blood Loss Clarification  THIS DOCUMENT IS NOT A PERMANENT PART OF THE MEDICAL RECORD  RESPOND TO THE THIS QUERY, FOLLOW THE INSTRUCTIONS BELOW:  1. If needed, update documentation for the patient's encounter via the notes activity.  2. Access this query again and click edit on the In [employer].  3. After updating, or not, click F2 to complete all highlighted (required) fields concerning your review. Select "additional documentation in the medical record" OR "no additional documentation provided".  4. Click Sign note button.  5. The deficiency will fall out of your In Basket *Please let us know if you are not able to complete this workflow by phone or e-mail (listed below).        07/04/12  Dear Avel Peace PA Marton Redwood  In an effort to better capture your patient's severity of illness, reflect appropriate length of stay and utilization of resources, a review of the patient medical record has revealed the following indicators.    Based on your clinical judgment, please clarify and document in a progress note and/or discharge summary the clinical condition associated with the following supporting information:  In responding to this query please exercise your independent judgment.  The fact that a query is asked, does not imply that any particular answer is desired or expected.   Post Op H/H=8.9/27.9  Clarification Needed    Please clarify if post op H/H can be further specified as one of the diagnoses listed below and document in pn or d/c summary.   Possible Clinical Conditions?   " Expected Acute Blood Loss Anemia  " Acute Blood Loss Anemia  " Acute on chronic blood loss anemia    " Other Condition________________  " Cannot Clinically Determine  Risk Factors: (recent surgery, pre op anemia, EBL in OR)  Supporting Information:  Signs and Symptoms (unable to ambulate, weakness, dizziness, unable to participate in care)  Diagnostics:  EBL: 200 per Op  Note: 07/02/2012 10:13 AM  Component     Latest Ref Rng 07/03/2012 07/04/2012  Hemoglobin     12.0 - 15.0 g/dL 9.9 (L) 8.9 (L)  HCT     36.0 - 46.0 % 31.5 (L) 27.9 (L)   Treatments: Transfusion: NS IVF  Reviewed: additional documentation in the medical record in the DC Summary  Thank You,  Enis Slipper  RN, BSN, MSN/Inf, CCDS Clinical Documentation Specialist [facility] HIM Dept Pager: [phone] / E-mail: [email or web address]  Health Information Management [place]  Please see DC summary. Thanks, Avel Peace, PA-C

## 2012-07-04 NOTE — Progress Notes (Signed)
   Subjective: 2 Days Post-Op Procedure(s) (LRB): TOTAL KNEE REVISION (Right) Patient reports pain as mild.   Patient seen in rounds with Dr. Lequita Halt. Patient is well, and has had no acute complaints or problems Patient is ready to go home today.  Objective: Vital signs in last 24 hours: Temp:  [97.8 F (36.6 C)-98.8 F (37.1 C)] 97.8 F (36.6 C) (02/21 0657) Pulse Rate:  [90-99] 90 (02/21 0657) Resp:  [14-16] 16 (02/21 0657) BP: (117-147)/(70-88) 123/76 mmHg (02/21 0657) SpO2:  [93 %-100 %] 100 % (02/21 0657)  Intake/Output from previous day:  Intake/Output Summary (Last 24 hours) at 07/04/12 0720 Last data filed at 07/03/12 2137  Gross per 24 hour  Intake  907.5 ml  Output    740 ml  Net  167.5 ml    Intake/Output this shift:    Labs:  Recent Labs  07/03/12 0521 07/04/12 0420  HGB 9.9* 8.9*    Recent Labs  07/03/12 0521 07/04/12 0420  WBC 13.2* 12.5*  RBC 3.50* 3.13*  HCT 31.5* 27.9*  PLT 244 215    Recent Labs  07/03/12 0521 07/04/12 0420  NA 139 138  K 4.3 3.9  CL 105 103  CO2 25 27  BUN 9 11  CREATININE 0.59 0.53  GLUCOSE 150* 151*  CALCIUM 8.0* 8.3*   No results found for this basename: LABPT, INR,  in the last 72 hours  EXAM: General - Patient is Alert, Appropriate and Oriented Extremity - Neurovascular intact Sensation intact distally Dorsiflexion/Plantar flexion intact No cellulitis present Incision - clean, dry, no drainage, healing Motor Function - intact, moving foot and toes well on exam.   Assessment/Plan: 2 Days Post-Op Procedure(s) (LRB): TOTAL KNEE REVISION (Right) Procedure(s) (LRB): TOTAL KNEE REVISION (Right) Past Medical History  Diagnosis Date  . Complication of anesthesia   . PONV (postoperative nausea and vomiting)   . Heart murmur 1970    MVP/ asympotmatic  . Depression   . Anxiety     sees Dr Carolin Guernsey  psychiatrist every 3 month  . DVT (deep venous thrombosis) 2008    RIGHT /POST OP  . Pulmonary  embolism 2008  . H/O hiatal hernia   . GERD (gastroesophageal reflux disease)   . Seizures     last seizure 1967- states reaction to promethazine  . Arthritis   . Peripheral vascular disease     venous insufficiency/ bilateral legs  . 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 Hyperlipidemia    Principal Problem:   Failed total knee arthroplasty  Estimated body mass index is 31.6 kg/(m^2) as calculated from the following:   Height as of this encounter: 5' 1.5" (1.562 m).   Weight as of this encounter: 77.111 kg (170 lb). Up with therapy Discharge home with home health Diet - Cardiac diet Follow up - in 2 weeks Activity - WBAT Disposition - Home Condition Upon Discharge - Good D/C Meds - See DC Summary DVT Prophylaxis - Xarelto  Carrie Keller 07/04/2012, 7:20 AM

## 2012-07-04 NOTE — Discharge Summary (Signed)
Physician Discharge Summary   Patient ID: Carrie Keller MRN: [id number] DOB/AGE: 09/09/1945 67 y.o.  Admit date: 07/02/2012 Discharge date: 07/04/2012  Primary Diagnosis:  Unstable right total knee arthroplasty  Admission Diagnoses:  Past Medical History  Diagnosis Date  . Complication of anesthesia   . PONV (postoperative nausea and vomiting)   . Heart murmur 1970    MVP/ asympotmatic  . Depression   . Anxiety     sees Dr Carolin Guernsey  psychiatrist every 3 month  . DVT (deep venous thrombosis) 2008    RIGHT /POST OP  . Pulmonary embolism 2008  . H/O hiatal hernia   . GERD (gastroesophageal reflux disease)   . Seizures     last seizure 1967- states reaction to promethazine  . Arthritis   . Peripheral vascular disease     venous insufficiency/ bilateral legs  . 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 Hyperlipidemia    Discharge Diagnoses:   Principal Problem:   Failed total knee arthroplasty  Estimated body mass index is 31.6 kg/(m^2) as calculated from the following:   Height as of this encounter: 5' 1.5" (1.562 m).   Weight as of this encounter: 77.111 kg (170 lb).  Classification of overweight in adults according to BMI (WHO, 1998)   Procedure:  Procedure(s) (LRB): TOTAL KNEE REVISION (Right)   Consults: None  HPI: Carrie Keller, a 67 year old female, right total knee  arthroplasty done several years ago. She has gone on to develop pain  and instability. She has tried a brace which helped minimally. She  presents now for total knee arthroplasty revision.  Laboratory Data: Admission on 07/02/2012  Component Date Value Range Status  . WBC 07/03/2012 13.2* 4.0 - 10.5 K/uL Final  . RBC 07/03/2012 3.50* 3.87 - 5.11 MIL/uL Final  . Hemoglobin 07/03/2012 9.9* 12.0 - 15.0 g/dL Final  . HCT 40/98/1191 31.5* 36.0 - 46.0 % Final  . MCV 07/03/2012 90.0  78.0 - 100.0 fL Final  . MCH 07/03/2012 28.3  26.0 - 34.0 pg Final  . MCHC 07/03/2012 31.4  30.0  - 36.0 g/dL Final  . RDW 47/82/9562 16.8* 11.5 - 15.5 % Final  . Platelets 07/03/2012 244  150 - 400 K/uL Final  . Sodium 07/03/2012 139  135 - 145 mEq/L Final  . Potassium 07/03/2012 4.3  3.5 - 5.1 mEq/L Final  . Chloride 07/03/2012 105  96 - 112 mEq/L Final  . CO2 07/03/2012 25  19 - 32 mEq/L Final  . Glucose, Bld 07/03/2012 150* 70 - 99 mg/dL Final  . BUN 13/12/6576 9  6 - 23 mg/dL Final  . Creatinine, Ser 07/03/2012 0.59  0.50 - 1.10 mg/dL Final  . Calcium 46/96/2952 8.0* 8.4 - 10.5 mg/dL Final  . GFR calc non Af Amer 07/03/2012 >90  >90 mL/min Final  . GFR calc Af Amer 07/03/2012 >90  >90 mL/min Final   Comment:                                 The eGFR has been calculated                          using the CKD EPI equation.                          This  calculation has not been                          validated in all clinical                          situations.                          eGFR's persistently                          <90 mL/min signify                          possible Chronic Kidney Disease.  . WBC 07/04/2012 12.5* 4.0 - 10.5 K/uL Final  . RBC 07/04/2012 3.13* 3.87 - 5.11 MIL/uL Final  . Hemoglobin 07/04/2012 8.9* 12.0 - 15.0 g/dL Final  . HCT 16/02/9603 27.9* 36.0 - 46.0 % Final  . MCV 07/04/2012 89.1  78.0 - 100.0 fL Final  . MCH 07/04/2012 28.4  26.0 - 34.0 pg Final  . MCHC 07/04/2012 31.9  30.0 - 36.0 g/dL Final  . RDW 54/01/8118 17.0* 11.5 - 15.5 % Final  . Platelets 07/04/2012 215  150 - 400 K/uL Final  . Sodium 07/04/2012 138  135 - 145 mEq/L Final  . Potassium 07/04/2012 3.9  3.5 - 5.1 mEq/L Final  . Chloride 07/04/2012 103  96 - 112 mEq/L Final  . CO2 07/04/2012 27  19 - 32 mEq/L Final  . Glucose, Bld 07/04/2012 151* 70 - 99 mg/dL Final  . BUN 14/78/2956 11  6 - 23 mg/dL Final  . Creatinine, Ser 07/04/2012 0.53  0.50 - 1.10 mg/dL Final  . Calcium 21/30/8657 8.3* 8.4 - 10.5 mg/dL Final  . GFR calc non Af Amer 07/04/2012 >90  >90 mL/min Final  .  GFR calc Af Amer 07/04/2012 >90  >90 mL/min Final   Comment:                                 The eGFR has been calculated                          using the CKD EPI equation.                          This calculation has not been                          validated in all clinical                          situations.                          eGFR's persistently                          <90 mL/min signify                          possible Chronic Kidney Disease.  Hospital Outpatient Visit on 06/25/2012  Component Date Value  Range Status  . aPTT 06/25/2012 35  24 - 37 seconds Final  . WBC 06/25/2012 5.4  4.0 - 10.5 K/uL Final  . RBC 06/25/2012 4.35  3.87 - 5.11 MIL/uL Final  . Hemoglobin 06/25/2012 12.2  12.0 - 15.0 g/dL Final  . HCT 86/57/8469 38.5  36.0 - 46.0 % Final  . MCV 06/25/2012 88.5  78.0 - 100.0 fL Final  . MCH 06/25/2012 28.0  26.0 - 34.0 pg Final  . MCHC 06/25/2012 31.7  30.0 - 36.0 g/dL Final  . RDW 62/95/2841 16.4* 11.5 - 15.5 % Final  . Platelets 06/25/2012 318  150 - 400 K/uL Final  . Sodium 06/25/2012 137  135 - 145 mEq/L Final  . Potassium 06/25/2012 4.4  3.5 - 5.1 mEq/L Final  . Chloride 06/25/2012 103  96 - 112 mEq/L Final  . CO2 06/25/2012 29  19 - 32 mEq/L Final  . Glucose, Bld 06/25/2012 70  70 - 99 mg/dL Final  . BUN 32/44/0102 8  6 - 23 mg/dL Final  . Creatinine, Ser 06/25/2012 0.62  0.50 - 1.10 mg/dL Final  . Calcium 72/53/6644 9.1  8.4 - 10.5 mg/dL Final  . Total Protein 06/25/2012 7.0  6.0 - 8.3 g/dL Final  . Albumin 03/47/4259 3.4* 3.5 - 5.2 g/dL Final  . AST 56/38/7564 23  0 - 37 U/L Final  . ALT 06/25/2012 18  0 - 35 U/L Final  . Alkaline Phosphatase 06/25/2012 89  39 - 117 U/L Final  . Total Bilirubin 06/25/2012 0.2* 0.3 - 1.2 mg/dL Final  . GFR calc non Af Amer 06/25/2012 >90  >90 mL/min Final  . GFR calc Af Amer 06/25/2012 >90  >90 mL/min Final   Comment:                                 The eGFR has been calculated                          using  the CKD EPI equation.                          This calculation has not been                          validated in all clinical                          situations.                          eGFR's persistently                          <90 mL/min signify                          possible Chronic Kidney Disease.  Marland Kitchen Prothrombin Time 06/25/2012 12.1  11.6 - 15.2 seconds Final  . INR 06/25/2012 0.90  0.00 - 1.49 Final  . ABO/RH(D) 06/25/2012 O POS   Final  . Antibody Screen 06/25/2012 NEG   Final  . Sample Expiration 06/25/2012 07/05/2012   Final  . Color, Urine 06/25/2012 YELLOW  YELLOW Final  . APPearance 06/25/2012 CLEAR  CLEAR Final  .  Specific Gravity, Urine 06/25/2012 1.012  1.005 - 1.030 Final  . pH 06/25/2012 7.5  5.0 - 8.0 Final  . Glucose, UA 06/25/2012 NEGATIVE  NEGATIVE mg/dL Final  . Hgb urine dipstick 06/25/2012 NEGATIVE  NEGATIVE Final  . Bilirubin Urine 06/25/2012 NEGATIVE  NEGATIVE Final  . Ketones, ur 06/25/2012 NEGATIVE  NEGATIVE mg/dL Final  . Protein, ur 16/02/9603 NEGATIVE  NEGATIVE mg/dL Final  . Urobilinogen, UA 06/25/2012 0.2  0.0 - 1.0 mg/dL Final  . Nitrite 54/01/8118 NEGATIVE  NEGATIVE Final  . Leukocytes, UA 06/25/2012 NEGATIVE  NEGATIVE Final   MICROSCOPIC NOT DONE ON URINES WITH NEGATIVE PROTEIN, BLOOD, LEUKOCYTES, NITRITE, OR GLUCOSE <1000 mg/dL.  Marland Kitchen MRSA, PCR 06/25/2012 NEGATIVE  NEGATIVE Final  . Staphylococcus aureus 06/25/2012 POSITIVE* NEGATIVE Final   Comment:                                 The Xpert SA Assay (FDA                          approved for NASAL specimens                          in patients over 35 years of age),                          is one component of                          a comprehensive surveillance                          program.  Test performance has                          been validated by [employer] for patients greater                          than or equal to 42 year old.                           It is not intended                          to diagnose infection nor to                          guide or monitor treatment.  . ABO/RH(D) 06/25/2012 O POS   Final     X-Rays:No results found.  EKG:No orders found for this or any previous visit.   Hospital Course: Carrie Keller is a 67 y.o. who was admitted to [facility]. They were brought to the operating room on 07/02/2012 and underwent Procedure(s): TOTAL KNEE REVISION.  Patient tolerated the procedure well and was later transferred to the recovery room and then to the orthopaedic floor for postoperative care.  They were given PO and IV analgesics for pain control following their surgery.  They were given 24 hours  of postoperative antibiotics of  Anti-infectives   Start     Dose/Rate Route Frequency Ordered Stop   07/02/12 1430  ceFAZolin (ANCEF) IVPB 1 g/50 mL premix     1 g 100 mL/hr over 30 Minutes Intravenous Every 6 hours 07/02/12 1221 07/02/12 2017   07/02/12 0615  ceFAZolin (ANCEF) IVPB 2 g/50 mL premix     2 g 100 mL/hr over 30 Minutes Intravenous On call to O.R. 07/02/12 8295 07/02/12 0829     and started on DVT prophylaxis in the form of Xarelto.   PT and OT were ordered for total joint protocol.  Discharge planning consulted to help with postop disposition and equipment needs.  Patient had a very good night on the evening of surgery and started to get up OOB with therapy on day one. Hemovac drain was pulled without difficulty.  Continued to work with therapy into day two.  Dressing was changed on day two and the incision was healing well.   Patient was seen in rounds and was ready to go home later that same day.   Discharge Medications: Prior to Admission medications   Medication Sig Start Date End Date Taking? Authorizing Provider  DULoxetine (CYMBALTA) 30 MG capsule Take 30 mg by mouth daily with breakfast.    Yes Historical Provider, MD  DULoxetine (CYMBALTA) 60 MG capsule Take 60 mg by mouth at bedtime.    Yes Historical Provider, MD  methocarbamol (ROBAXIN) 500 MG tablet Take 1 tablet (500 mg total) by mouth every 6 (six) hours as needed. 07/04/12   Alexzandrew Julien Girt, PA  oxyCODONE (OXY IR/ROXICODONE) 5 MG immediate release tablet Take 1-2 tablets (5-10 mg total) by mouth every 3 (three) hours as needed. 07/04/12   Alexzandrew Julien Girt, PA  polyethylene glycol (MIRALAX / GLYCOLAX) packet Take 17 g by mouth daily.   Yes Historical Provider, MD  pramipexole (MIRAPEX) 1 MG tablet Take 1 mg by mouth at bedtime.    Historical Provider, MD  QUEtiapine (SEROQUEL) 50 MG tablet Take 50 mg by mouth at bedtime.    Historical Provider, MD  rivaroxaban (XARELTO) 10 MG TABS tablet Take 1 tablet (10 mg total) by mouth daily with breakfast. Take Xarelto for two and a half more weeks, then discontinue Xarelto. 07/04/12   Alexzandrew Julien Girt, PA  rosuvastatin (CRESTOR) 10 MG tablet Take 10 mg by mouth every morning.    Historical Provider, MD  traZODone (DESYREL) 50 MG tablet Take 50 mg by mouth at bedtime.    Historical Provider, MD    Diet: Cardiac diet Activity:WBAT Follow-up:in 2 weeks Disposition - Home Discharged Condition: good   Discharge Orders   Future Orders Complete By Expires     Call MD / Call 911  As directed     Comments:      If you experience chest pain or shortness of breath, CALL 911 and be transported to the hospital emergency room.  If you develope a fever above 101 F, pus (white drainage) or increased drainage or redness at the wound, or calf pain, call your surgeon's office.    Change dressing  As directed     Comments:      Change dressing daily with sterile 4 x 4 inch gauze dressing and apply TED hose. Do not submerge the incision under water.    Constipation Prevention  As directed     Comments:      Drink plenty of fluids.  Prune juice may be helpful.  You may use a  stool softener, such as Colace (over the counter) 100 mg twice a day.  Use MiraLax (over the counter) for  constipation as needed.    Diet - low sodium heart healthy  As directed     Discharge instructions  As directed     Comments:      Pick up stool softner and laxative for home. Do not submerge incision under water. May shower. Continue to use ice for pain and swelling from surgery.  Take Xarelto for two and a half more weeks, then discontinue Xarelto.    Do not put a pillow under the knee. Place it under the heel.  As directed     Do not sit on low chairs, stoools or toilet seats, as it may be difficult to get up from low surfaces  As directed     Driving restrictions  As directed     Comments:      No driving until released by the physician.    Increase activity slowly as tolerated  As directed     Lifting restrictions  As directed     Comments:      No lifting until released by the physician.    Patient may shower  As directed     Comments:      You may shower without a dressing once there is no drainage.  Do not wash over the wound.  If drainage remains, do not shower until drainage stops.    TED hose  As directed     Comments:      Use stockings (TED hose) for 3 weeks on both leg(s).  You may remove them at night for sleeping.    Weight bearing as tolerated  As directed         Medication List    STOP taking these medications       calcium carbonate 1250 MG tablet  Commonly known as:  OS-CAL - dosed in mg of elemental calcium     estradiol 2 MG tablet  Commonly known as:  ESTRACE     multivitamin with minerals Tabs      TAKE these medications       DULoxetine 30 MG capsule  Commonly known as:  CYMBALTA  Take 30 mg by mouth daily with breakfast.     DULoxetine 60 MG capsule  Commonly known as:  CYMBALTA  Take 60 mg by mouth at bedtime.     methocarbamol 500 MG tablet  Commonly known as:  ROBAXIN  Take 1 tablet (500 mg total) by mouth every 6 (six) hours as needed.     oxyCODONE 5 MG immediate release tablet  Commonly known as:  Oxy IR/ROXICODONE  Take 1-2  tablets (5-10 mg total) by mouth every 3 (three) hours as needed.     polyethylene glycol packet  Commonly known as:  MIRALAX / GLYCOLAX  Take 17 g by mouth daily.     pramipexole 1 MG tablet  Commonly known as:  MIRAPEX  Take 1 mg by mouth at bedtime.     QUEtiapine 50 MG tablet  Commonly known as:  SEROQUEL  Take 50 mg by mouth at bedtime.     rivaroxaban 10 MG Tabs tablet  Commonly known as:  XARELTO  Take 1 tablet (10 mg total) by mouth daily with breakfast. Take Xarelto for two and a half more weeks, then discontinue Xarelto.     rosuvastatin 10 MG tablet  Commonly known as:  CRESTOR  Take 10  mg by mouth every morning.     traZODone 50 MG tablet  Commonly known as:  DESYREL  Take 50 mg by mouth at bedtime.           Follow-up Information   Follow up with Loanne Drilling, MD. Schedule an appointment as soon as possible for a visit in 2 weeks.   Contact information:   [address] [place] [address] [phone]       Signed: Patrica Duel 07/04/2012, 9:22 AM

## 2012-07-17 DIAGNOSIS — D62 Acute posthemorrhagic anemia: Secondary | ICD-10-CM

## 2013-04-15 ENCOUNTER — Other Ambulatory Visit: Payer: Self-pay | Admitting: Neurological Surgery

## 2013-04-15 DIAGNOSIS — M48061 Spinal stenosis, lumbar region without neurogenic claudication: Secondary | ICD-10-CM

## 2013-04-21 ENCOUNTER — Ambulatory Visit
Admission: RE | Admit: 2013-04-21 | Discharge: 2013-04-21 | Disposition: A | Discharge status: Home / Self Care | Payer: Medicare Other | Source: Ambulatory Visit | Attending: Neurological Surgery | Admitting: Neurological Surgery

## 2013-04-21 ENCOUNTER — Other Ambulatory Visit: Payer: Medicare Other

## 2013-04-21 VITALS — BP 126/74 | HR 84

## 2013-04-21 DIAGNOSIS — M48061 Spinal stenosis, lumbar region without neurogenic claudication: Secondary | ICD-10-CM

## 2013-04-21 MED ORDER — DIPHENHYDRAMINE HCL 50 MG/ML IJ SOLN
50.0000 mg | Freq: Once | INTRAMUSCULAR | Status: AC
  Administered 2013-04-21: 50 mg via INTRAMUSCULAR

## 2013-04-21 MED ORDER — DIAZEPAM 5 MG PO TABS
5.0000 mg | ORAL_TABLET | Freq: Once | ORAL | Status: AC
  Administered 2013-04-21: 5 mg via ORAL

## 2013-04-21 MED ORDER — ONDANSETRON HCL 4 MG/2ML IJ SOLN
4.0000 mg | Freq: Once | INTRAMUSCULAR | Status: AC
  Administered 2013-04-21: 4 mg via INTRAMUSCULAR

## 2013-04-21 MED ORDER — IOHEXOL 180 MG/ML  SOLN
15.0000 mL | Freq: Once | INTRAMUSCULAR | Status: AC | PRN
  Administered 2013-04-21: 15 mL via INTRATHECAL

## 2013-04-21 MED ORDER — HYDROMORPHONE HCL PF 2 MG/ML IJ SOLN
2.0000 mg | Freq: Once | INTRAMUSCULAR | Status: AC
  Administered 2013-04-21: 2 mg via INTRAMUSCULAR

## 2013-04-21 NOTE — Progress Notes (Addendum)
Patient states she has been off Cymbalta and Trazodone for at least three days.  Donell Sievert, RN

## 2013-04-21 NOTE — Progress Notes (Addendum)
Patient complaining of itching all over body now.  No hives noted; denies dyspnea.  Medicated per MAR.  jkl

## 2013-04-21 NOTE — Progress Notes (Signed)
Patient states she has a slight headache but that her legs "have settled down" since receiving pain medication.   Patient with complaint of itching at right clavicle area.  Will watch for now.  Donell Sievert, RN

## 2013-04-22 ENCOUNTER — Telehealth: Payer: Self-pay

## 2013-04-22 NOTE — Telephone Encounter (Signed)
Explained to pt that it was too soon for a blood patch.  She needed 24 more hours of bedrest and if she still had headache after 9:00 am in the morning to call us and we would get her in. Office notified. [email or web address]

## 2013-04-23 ENCOUNTER — Telehealth: Payer: Self-pay | Admitting: Radiology

## 2013-04-23 NOTE — Telephone Encounter (Signed)
Called to pt to see if headache had improved no answer. Left message for pt to call if she needed [place].

## 2013-04-28 ENCOUNTER — Other Ambulatory Visit: Payer: Self-pay | Admitting: Neurological Surgery

## 2013-05-19 ENCOUNTER — Encounter (HOSPITAL_COMMUNITY): Payer: Self-pay | Admitting: Pharmacy Technician

## 2013-05-25 ENCOUNTER — Other Ambulatory Visit (HOSPITAL_COMMUNITY): Payer: Self-pay | Admitting: *Deleted

## 2013-05-25 NOTE — Pre-Procedure Instructions (Signed)
Carrie Keller  05/25/2013   Your procedure is scheduled on:  Wednesday, June 03, 2013 at 8:30 AM.   Report to [facility] Entrance "A" Admitting Office at 6:30 AM.   Call this number if you have problems the morning of surgery: [phone]   Remember:   Do not eat food or drink liquids after midnight Tuesday, 06/02/13.   Take these medicines the morning of surgery with A SIP OF WATER: DULoxetine (CYMBALTA), estradiol (ESTRACE), HYDROmorphone (DILAUDID) - if needed.   Stop all Vitamins as of Wednesday, 05/27/13.     Do not wear jewelry, make-up or nail polish.  Do not wear lotions, powders, or perfumes. You may wear deodorant.  Do not shave 48 hours prior to surgery.   Do not bring valuables to the hospital.  [facility] is not responsible  for any belongings or valuables.               Contacts, dentures or bridgework may not be worn into surgery.  Leave suitcase in the car. After surgery it may be brought to your room.  For patients admitted to the hospital, discharge time is determined by your  treatment team.               Special Instructions: Shower using CHG 2 nights before surgery and the night before surgery.  If you shower the day of surgery use CHG.  Use special wash - you have one bottle of CHG for all showers.  You should use approximately 1/3 of the bottle for each shower.   Please read over the following fact sheets that you were given: Pain Booklet, Coughing and Deep Breathing, Blood Transfusion Information, MRSA Information and Surgical Site Infection Prevention

## 2013-05-26 ENCOUNTER — Encounter (HOSPITAL_COMMUNITY)
Admission: RE | Admit: 2013-05-26 | Discharge: 2013-05-26 | Disposition: A | Discharge status: Home / Self Care | Payer: Medicare Other | Source: Ambulatory Visit | Attending: Neurological Surgery | Admitting: Neurological Surgery

## 2013-05-26 ENCOUNTER — Encounter (HOSPITAL_COMMUNITY): Payer: Self-pay

## 2013-05-26 DIAGNOSIS — Z01811 Encounter for preprocedural respiratory examination: Secondary | ICD-10-CM | POA: Insufficient documentation

## 2013-05-26 DIAGNOSIS — Z01812 Encounter for preprocedural laboratory examination: Secondary | ICD-10-CM | POA: Insufficient documentation

## 2013-05-26 DIAGNOSIS — Z01810 Encounter for preprocedural cardiovascular examination: Secondary | ICD-10-CM | POA: Insufficient documentation

## 2013-05-26 DIAGNOSIS — Z01818 Encounter for other preprocedural examination: Secondary | ICD-10-CM | POA: Insufficient documentation

## 2013-05-26 LAB — CBC WITH DIFFERENTIAL/PLATELET
Basophils Absolute: 0 10*3/uL (ref 0.0–0.1)
Basophils Relative: 0 % (ref 0–1)
EOS ABS: 0.1 10*3/uL (ref 0.0–0.7)
Eosinophils Relative: 1 % (ref 0–5)
HCT: 39.6 % (ref 36.0–46.0)
Hemoglobin: 13.3 g/dL (ref 12.0–15.0)
LYMPHS ABS: 1.6 10*3/uL (ref 0.7–4.0)
LYMPHS PCT: 23 % (ref 12–46)
MCH: 31.4 pg (ref 26.0–34.0)
MCHC: 33.6 g/dL (ref 30.0–36.0)
MCV: 93.6 fL (ref 78.0–100.0)
MONOS PCT: 7 % (ref 3–12)
Monocytes Absolute: 0.5 10*3/uL (ref 0.1–1.0)
NEUTROS PCT: 68 % (ref 43–77)
Neutro Abs: 4.7 10*3/uL (ref 1.7–7.7)
Platelets: 296 10*3/uL (ref 150–400)
RBC: 4.23 MIL/uL (ref 3.87–5.11)
RDW: 15 % (ref 11.5–15.5)
WBC: 7 10*3/uL (ref 4.0–10.5)

## 2013-05-26 LAB — BASIC METABOLIC PANEL
BUN: 9 mg/dL (ref 6–23)
CO2: 29 mEq/L (ref 19–32)
Calcium: 9.3 mg/dL (ref 8.4–10.5)
Chloride: 101 mEq/L (ref 96–112)
Creatinine, Ser: 0.71 mg/dL (ref 0.50–1.10)
GFR calc Af Amer: >90 mL/min (ref >90)
GFR, EST NON AFRICAN AMERICAN: 86 mL/min — AB (ref >90)
GLUCOSE: 81 mg/dL (ref 70–99)
POTASSIUM: 4.5 meq/L (ref 3.7–5.3)
SODIUM: 140 meq/L (ref 137–147)

## 2013-05-26 LAB — ABO/RH: ABO/RH(D): O POS

## 2013-05-26 LAB — PROTIME-INR
INR: 0.94 (ref 0.00–1.49)
Prothrombin Time: 12.4 seconds (ref 11.6–15.2)

## 2013-05-26 LAB — TYPE AND SCREEN
ABO/RH(D): O POS
Antibody Screen: NEGATIVE

## 2013-05-26 LAB — SURGICAL PCR SCREEN
MRSA, PCR: NEGATIVE
Staphylococcus aureus: POSITIVE — AB

## 2013-05-26 NOTE — Progress Notes (Signed)
Carrie Keller at Dr Adah Salvage office made that patient's nasal swab was positive for staph.

## 2013-06-02 MED ORDER — DEXAMETHASONE SODIUM PHOSPHATE 10 MG/ML IJ SOLN
10.0000 mg | INTRAMUSCULAR | Status: AC
  Administered 2013-06-03: 10 mg via INTRAVENOUS
  Filled 2013-06-02: qty 1

## 2013-06-02 MED ORDER — CEFAZOLIN SODIUM-DEXTROSE 2-3 GM-% IV SOLR
2.0000 g | INTRAVENOUS | Status: AC
  Administered 2013-06-03: 2 g via INTRAVENOUS

## 2013-06-03 ENCOUNTER — Encounter (HOSPITAL_COMMUNITY): Payer: Self-pay | Admitting: *Deleted

## 2013-06-03 ENCOUNTER — Encounter (HOSPITAL_COMMUNITY)
Admission: RE | Disposition: A | Discharge status: Home / Self Care | Payer: Medicare Other | Source: Ambulatory Visit | Attending: Neurological Surgery

## 2013-06-03 ENCOUNTER — Inpatient Hospital Stay (HOSPITAL_COMMUNITY): Payer: Medicare Other

## 2013-06-03 ENCOUNTER — Inpatient Hospital Stay (HOSPITAL_COMMUNITY): Payer: Medicare Other | Admitting: Anesthesiology

## 2013-06-03 ENCOUNTER — Encounter (HOSPITAL_COMMUNITY): Payer: Medicare Other | Admitting: Anesthesiology

## 2013-06-03 ENCOUNTER — Inpatient Hospital Stay (HOSPITAL_COMMUNITY)
Admission: RE | Admit: 2013-06-03 | Discharge: 2013-06-04 | DRG: 460 | Disposition: A | Discharge status: Home / Self Care | Payer: Medicare Other | Source: Ambulatory Visit | Attending: Neurological Surgery | Admitting: Neurological Surgery

## 2013-06-03 DIAGNOSIS — Z7901 Long term (current) use of anticoagulants: Secondary | ICD-10-CM

## 2013-06-03 DIAGNOSIS — Z86718 Personal history of other venous thrombosis and embolism: Secondary | ICD-10-CM

## 2013-06-03 DIAGNOSIS — I872 Venous insufficiency (chronic) (peripheral): Secondary | ICD-10-CM | POA: Diagnosis present

## 2013-06-03 DIAGNOSIS — Q762 Congenital spondylolisthesis: Principal | ICD-10-CM

## 2013-06-03 DIAGNOSIS — Z9851 Tubal ligation status: Secondary | ICD-10-CM

## 2013-06-03 DIAGNOSIS — I739 Peripheral vascular disease, unspecified: Secondary | ICD-10-CM | POA: Diagnosis present

## 2013-06-03 DIAGNOSIS — M5126 Other intervertebral disc displacement, lumbar region: Secondary | ICD-10-CM | POA: Diagnosis present

## 2013-06-03 DIAGNOSIS — Z96659 Presence of unspecified artificial knee joint: Secondary | ICD-10-CM

## 2013-06-03 DIAGNOSIS — Z87891 Personal history of nicotine dependence: Secondary | ICD-10-CM

## 2013-06-03 DIAGNOSIS — M4180 Other forms of scoliosis, site unspecified: Secondary | ICD-10-CM | POA: Diagnosis present

## 2013-06-03 DIAGNOSIS — F329 Major depressive disorder, single episode, unspecified: Secondary | ICD-10-CM | POA: Diagnosis present

## 2013-06-03 DIAGNOSIS — F411 Generalized anxiety disorder: Secondary | ICD-10-CM | POA: Diagnosis present

## 2013-06-03 DIAGNOSIS — E785 Hyperlipidemia, unspecified: Secondary | ICD-10-CM | POA: Diagnosis present

## 2013-06-03 DIAGNOSIS — K219 Gastro-esophageal reflux disease without esophagitis: Secondary | ICD-10-CM | POA: Diagnosis present

## 2013-06-03 DIAGNOSIS — Z981 Arthrodesis status: Secondary | ICD-10-CM

## 2013-06-03 DIAGNOSIS — Z79899 Other long term (current) drug therapy: Secondary | ICD-10-CM

## 2013-06-03 DIAGNOSIS — F3289 Other specified depressive episodes: Secondary | ICD-10-CM | POA: Diagnosis present

## 2013-06-03 DIAGNOSIS — Z86711 Personal history of pulmonary embolism: Secondary | ICD-10-CM

## 2013-06-03 HISTORY — PX: MAXIMUM ACCESS (MAS)POSTERIOR LUMBAR INTERBODY FUSION (PLIF) 2 LEVEL: SHX6369

## 2013-06-03 SURGERY — FOR MAXIMUM ACCESS (MAS) POSTERIOR LUMBAR INTERBODY FUSION (PLIF) 2 LEVEL
Anesthesia: General | Site: Back

## 2013-06-03 MED ORDER — LIDOCAINE HCL (CARDIAC) 20 MG/ML IV SOLN
INTRAVENOUS | Status: DC | PRN
  Administered 2013-06-03: 50 mg via INTRAVENOUS

## 2013-06-03 MED ORDER — METHOCARBAMOL 500 MG PO TABS
500.0000 mg | ORAL_TABLET | Freq: Four times a day (QID) | ORAL | Status: DC | PRN
  Administered 2013-06-03 – 2013-06-04 (×3): 500 mg via ORAL
  Filled 2013-06-03 (×3): qty 1

## 2013-06-03 MED ORDER — ESTRADIOL 2 MG PO TABS
2.0000 mg | ORAL_TABLET | Freq: Every day | ORAL | Status: DC
  Administered 2013-06-03: 2 mg via ORAL
  Filled 2013-06-03 (×2): qty 1

## 2013-06-03 MED ORDER — 0.9 % SODIUM CHLORIDE (POUR BTL) OPTIME
TOPICAL | Status: DC | PRN
  Administered 2013-06-03: 1000 mL

## 2013-06-03 MED ORDER — PROPOFOL 10 MG/ML IV BOLUS
INTRAVENOUS | Status: AC
  Filled 2013-06-03: qty 20

## 2013-06-03 MED ORDER — HYDROMORPHONE HCL PF 1 MG/ML IJ SOLN
INTRAMUSCULAR | Status: AC
  Filled 2013-06-03: qty 1

## 2013-06-03 MED ORDER — CELECOXIB 200 MG PO CAPS
200.0000 mg | ORAL_CAPSULE | Freq: Two times a day (BID) | ORAL | Status: DC
  Administered 2013-06-03: 200 mg via ORAL
  Filled 2013-06-03 (×3): qty 1

## 2013-06-03 MED ORDER — MIDAZOLAM HCL 5 MG/5ML IJ SOLN
INTRAMUSCULAR | Status: DC | PRN
  Administered 2013-06-03: 2 mg via INTRAVENOUS

## 2013-06-03 MED ORDER — PHENYLEPHRINE 40 MCG/ML (10ML) SYRINGE FOR IV PUSH (FOR BLOOD PRESSURE SUPPORT)
PREFILLED_SYRINGE | INTRAVENOUS | Status: AC
  Filled 2013-06-03: qty 10

## 2013-06-03 MED ORDER — MIDAZOLAM HCL 2 MG/2ML IJ SOLN: 0.5000 mg | Freq: Once | INTRAMUSCULAR | Status: DC | PRN

## 2013-06-03 MED ORDER — POLYETHYLENE GLYCOL 3350 17 G PO PACK
17.0000 g | PACK | Freq: Every day | ORAL | Status: DC | PRN
  Filled 2013-06-03: qty 1

## 2013-06-03 MED ORDER — PRAMIPEXOLE DIHYDROCHLORIDE 1 MG PO TABS
2.0000 mg | ORAL_TABLET | Freq: Every day | ORAL | Status: DC
  Administered 2013-06-03: 2 mg via ORAL
  Filled 2013-06-03 (×2): qty 2

## 2013-06-03 MED ORDER — LIDOCAINE HCL (CARDIAC) 20 MG/ML IV SOLN
INTRAVENOUS | Status: AC
  Filled 2013-06-03: qty 5

## 2013-06-03 MED ORDER — QUETIAPINE FUMARATE 50 MG PO TABS
50.0000 mg | ORAL_TABLET | Freq: Every day | ORAL | Status: DC
  Administered 2013-06-03: 50 mg via ORAL
  Filled 2013-06-03 (×2): qty 1

## 2013-06-03 MED ORDER — SUFENTANIL CITRATE 50 MCG/ML IV SOLN
INTRAVENOUS | Status: AC
  Filled 2013-06-03: qty 1

## 2013-06-03 MED ORDER — PROPOFOL 10 MG/ML IV BOLUS
INTRAVENOUS | Status: DC | PRN
  Administered 2013-06-03: 50 mg via INTRAVENOUS
  Administered 2013-06-03: 70 mg via INTRAVENOUS

## 2013-06-03 MED ORDER — HYDROMORPHONE HCL 2 MG PO TABS
2.0000 mg | ORAL_TABLET | Freq: Four times a day (QID) | ORAL | Status: DC | PRN
  Administered 2013-06-03 – 2013-06-04 (×3): 2 mg via ORAL
  Filled 2013-06-03 (×3): qty 1

## 2013-06-03 MED ORDER — BUPIVACAINE HCL (PF) 0.25 % IJ SOLN
INTRAMUSCULAR | Status: DC | PRN
  Administered 2013-06-03: 6 mL

## 2013-06-03 MED ORDER — SODIUM CHLORIDE 0.9 % IV SOLN: 250.0000 mL | INTRAVENOUS | Status: DC

## 2013-06-03 MED ORDER — OXYCODONE HCL 5 MG/5ML PO SOLN: 5.0000 mg | Freq: Once | ORAL | Status: AC | PRN

## 2013-06-03 MED ORDER — SODIUM CHLORIDE 0.9 % IR SOLN
Status: DC | PRN
  Administered 2013-06-03: 09:00:00

## 2013-06-03 MED ORDER — PROMETHAZINE HCL 25 MG/ML IJ SOLN: 6.2500 mg | INTRAMUSCULAR | Status: DC | PRN

## 2013-06-03 MED ORDER — SODIUM CHLORIDE 0.9 % IJ SOLN: 3.0000 mL | INTRAMUSCULAR | Status: DC | PRN

## 2013-06-03 MED ORDER — ONDANSETRON HCL 4 MG/2ML IJ SOLN
INTRAMUSCULAR | Status: DC | PRN
  Administered 2013-06-03: 4 mg via INTRAVENOUS

## 2013-06-03 MED ORDER — ACETAMINOPHEN 325 MG PO TABS: 650.0000 mg | ORAL_TABLET | ORAL | Status: DC | PRN

## 2013-06-03 MED ORDER — DEXAMETHASONE 4 MG PO TABS
4.0000 mg | ORAL_TABLET | Freq: Four times a day (QID) | ORAL | Status: DC
  Administered 2013-06-03 – 2013-06-04 (×3): 4 mg via ORAL
  Filled 2013-06-03 (×7): qty 1

## 2013-06-03 MED ORDER — DEXAMETHASONE SODIUM PHOSPHATE 4 MG/ML IJ SOLN
4.0000 mg | Freq: Four times a day (QID) | INTRAMUSCULAR | Status: DC
  Filled 2013-06-03 (×4): qty 1

## 2013-06-03 MED ORDER — SUCCINYLCHOLINE CHLORIDE 20 MG/ML IJ SOLN
INTRAMUSCULAR | Status: AC
  Filled 2013-06-03: qty 1

## 2013-06-03 MED ORDER — PHENOL 1.4 % MT LIQD: 1.0000 | OROMUCOSAL | Status: DC | PRN

## 2013-06-03 MED ORDER — SODIUM CHLORIDE 0.9 % IJ SOLN
INTRAMUSCULAR | Status: AC
  Filled 2013-06-03: qty 10

## 2013-06-03 MED ORDER — MEPERIDINE HCL 25 MG/ML IJ SOLN: 6.2500 mg | INTRAMUSCULAR | Status: DC | PRN

## 2013-06-03 MED ORDER — PHENYLEPHRINE HCL 10 MG/ML IJ SOLN
10.0000 mg | INTRAVENOUS | Status: DC | PRN
  Administered 2013-06-03: 10 ug/min via INTRAVENOUS

## 2013-06-03 MED ORDER — ACETAMINOPHEN 650 MG RE SUPP: 650.0000 mg | RECTAL | Status: DC | PRN

## 2013-06-03 MED ORDER — SUCCINYLCHOLINE CHLORIDE 20 MG/ML IJ SOLN
INTRAMUSCULAR | Status: DC | PRN
  Administered 2013-06-03: 100 mg via INTRAVENOUS

## 2013-06-03 MED ORDER — SUFENTANIL CITRATE 50 MCG/ML IV SOLN
INTRAVENOUS | Status: DC | PRN
  Administered 2013-06-03: 10 ug via INTRAVENOUS
  Administered 2013-06-03: 30 ug via INTRAVENOUS
  Administered 2013-06-03 (×3): 10 ug via INTRAVENOUS

## 2013-06-03 MED ORDER — LACTATED RINGERS IV SOLN
INTRAVENOUS | Status: DC | PRN
  Administered 2013-06-03 (×2): via INTRAVENOUS

## 2013-06-03 MED ORDER — KETOROLAC TROMETHAMINE 15 MG/ML IJ SOLN
INTRAMUSCULAR | Status: DC | PRN
  Administered 2013-06-03: 15 mg via INTRAVENOUS

## 2013-06-03 MED ORDER — PHENYLEPHRINE HCL 10 MG/ML IJ SOLN
INTRAMUSCULAR | Status: DC | PRN
  Administered 2013-06-03: 40 ug via INTRAVENOUS
  Administered 2013-06-03: 80 ug via INTRAVENOUS

## 2013-06-03 MED ORDER — KETOROLAC TROMETHAMINE 30 MG/ML IJ SOLN
INTRAMUSCULAR | Status: AC
  Filled 2013-06-03: qty 1

## 2013-06-03 MED ORDER — ARTIFICIAL TEARS OP OINT
TOPICAL_OINTMENT | OPHTHALMIC | Status: DC | PRN
  Administered 2013-06-03: 1 via OPHTHALMIC

## 2013-06-03 MED ORDER — EPHEDRINE SULFATE 50 MG/ML IJ SOLN
INTRAMUSCULAR | Status: AC
  Filled 2013-06-03: qty 1

## 2013-06-03 MED ORDER — DEXTROSE 5 % IV SOLN
500.0000 mg | Freq: Four times a day (QID) | INTRAVENOUS | Status: DC | PRN
  Filled 2013-06-03: qty 5

## 2013-06-03 MED ORDER — ONDANSETRON HCL 4 MG/2ML IJ SOLN
INTRAMUSCULAR | Status: AC
  Filled 2013-06-03: qty 2

## 2013-06-03 MED ORDER — OXYCODONE HCL 5 MG PO TABS
ORAL_TABLET | ORAL | Status: AC
  Filled 2013-06-03: qty 1

## 2013-06-03 MED ORDER — THROMBIN 20000 UNITS EX SOLR
CUTANEOUS | Status: DC | PRN
  Administered 2013-06-03: 09:00:00 via TOPICAL

## 2013-06-03 MED ORDER — ARTIFICIAL TEARS OP OINT
TOPICAL_OINTMENT | OPHTHALMIC | Status: AC
  Filled 2013-06-03: qty 3.5

## 2013-06-03 MED ORDER — MORPHINE SULFATE 2 MG/ML IJ SOLN
1.0000 mg | INTRAMUSCULAR | Status: DC | PRN
  Administered 2013-06-03: 2 mg via INTRAVENOUS
  Filled 2013-06-03: qty 1

## 2013-06-03 MED ORDER — DULOXETINE HCL 60 MG PO CPEP
60.0000 mg | ORAL_CAPSULE | Freq: Every day | ORAL | Status: DC
  Administered 2013-06-03: 60 mg via ORAL
  Filled 2013-06-03 (×2): qty 1

## 2013-06-03 MED ORDER — CEFAZOLIN SODIUM 1-5 GM-% IV SOLN
1.0000 g | Freq: Three times a day (TID) | INTRAVENOUS | Status: AC
  Administered 2013-06-03 – 2013-06-04 (×2): 1 g via INTRAVENOUS
  Filled 2013-06-03 (×2): qty 50

## 2013-06-03 MED ORDER — THROMBIN 5000 UNITS EX SOLR
OROMUCOSAL | Status: DC | PRN
  Administered 2013-06-03: 09:00:00 via TOPICAL

## 2013-06-03 MED ORDER — SODIUM CHLORIDE 0.9 % IJ SOLN
3.0000 mL | Freq: Two times a day (BID) | INTRAMUSCULAR | Status: DC
  Administered 2013-06-03: 3 mL via INTRAVENOUS

## 2013-06-03 MED ORDER — MENTHOL 3 MG MT LOZG: 1.0000 | LOZENGE | OROMUCOSAL | Status: DC | PRN

## 2013-06-03 MED ORDER — DULOXETINE HCL 30 MG PO CPEP
30.0000 mg | ORAL_CAPSULE | Freq: Every day | ORAL | Status: DC
  Administered 2013-06-03: 30 mg via ORAL
  Filled 2013-06-03 (×2): qty 1

## 2013-06-03 MED ORDER — ONDANSETRON HCL 4 MG/2ML IJ SOLN: 4.0000 mg | INTRAMUSCULAR | Status: DC | PRN

## 2013-06-03 MED ORDER — OXYCODONE HCL 5 MG PO TABS
5.0000 mg | ORAL_TABLET | Freq: Once | ORAL | Status: AC | PRN
  Administered 2013-06-03: 5 mg via ORAL

## 2013-06-03 MED ORDER — TRAZODONE HCL 50 MG PO TABS
50.0000 mg | ORAL_TABLET | Freq: Every day | ORAL | Status: DC
  Administered 2013-06-03: 50 mg via ORAL
  Filled 2013-06-03 (×2): qty 1

## 2013-06-03 MED ORDER — HYDROMORPHONE HCL PF 1 MG/ML IJ SOLN
0.2500 mg | INTRAMUSCULAR | Status: DC | PRN
  Administered 2013-06-03 (×4): 0.5 mg via INTRAVENOUS

## 2013-06-03 MED ORDER — MIDAZOLAM HCL 2 MG/2ML IJ SOLN
INTRAMUSCULAR | Status: AC
  Filled 2013-06-03: qty 2

## 2013-06-03 MED ORDER — POTASSIUM CHLORIDE IN NACL 20-0.9 MEQ/L-% IV SOLN
INTRAVENOUS | Status: DC
  Filled 2013-06-03 (×3): qty 1000

## 2013-06-03 SURGICAL SUPPLY — 72 items
BAG DECANTER FOR FLEXI CONT (MISCELLANEOUS) ×3 IMPLANT
BENZOIN TINCTURE PRP APPL 2/3 (GAUZE/BANDAGES/DRESSINGS) ×3 IMPLANT
BLADE SURG ROTATE 9660 (MISCELLANEOUS) IMPLANT
BONE MATRIX OSTEOCEL PRO MED (Bone Implant) ×6 IMPLANT
BUR MATCHSTICK NEURO 3.0 LAGG (BURR) ×3 IMPLANT
CAGE COROENT MP 8X23 (Cage) ×6 IMPLANT
CAGE PLIF 8X9X23-12 LUMBAR (Cage) ×6 IMPLANT
CANISTER SUCT 3000ML (MISCELLANEOUS) ×3 IMPLANT
CLIP NEUROVISION LG (CLIP) ×3 IMPLANT
CLOSURE WOUND 1/2 X4 (GAUZE/BANDAGES/DRESSINGS) ×2
CONT SPEC 4OZ CLIKSEAL STRL BL (MISCELLANEOUS) ×6 IMPLANT
COVER BACK TABLE 24X17X13 BIG (DRAPES) IMPLANT
COVER TABLE BACK 60X90 (DRAPES) ×3 IMPLANT
DRAPE C-ARM 42X72 X-RAY (DRAPES) ×3 IMPLANT
DRAPE C-ARMOR (DRAPES) ×3 IMPLANT
DRAPE LAPAROTOMY 100X72X124 (DRAPES) ×3 IMPLANT
DRAPE POUCH INSTRU U-SHP 10X18 (DRAPES) ×3 IMPLANT
DRAPE SURG 17X23 STRL (DRAPES) ×3 IMPLANT
DRESSING TELFA 8X3 (GAUZE/BANDAGES/DRESSINGS) ×3 IMPLANT
DRSG OPSITE 4X5.5 SM (GAUZE/BANDAGES/DRESSINGS) ×3 IMPLANT
DRSG OPSITE POSTOP 4X6 (GAUZE/BANDAGES/DRESSINGS) ×3 IMPLANT
DURAPREP 26ML APPLICATOR (WOUND CARE) ×3 IMPLANT
ELECT REM PT RETURN 9FT ADLT (ELECTROSURGICAL) ×3
ELECTRODE REM PT RTRN 9FT ADLT (ELECTROSURGICAL) ×1 IMPLANT
EVACUATOR 1/8 PVC DRAIN (DRAIN) ×3 IMPLANT
GAUZE SPONGE 4X4 16PLY XRAY LF (GAUZE/BANDAGES/DRESSINGS) IMPLANT
GLOVE BIO SURGEON STRL SZ8 (GLOVE) ×12 IMPLANT
GLOVE BIOGEL PI IND STRL 7.0 (GLOVE) ×4 IMPLANT
GLOVE BIOGEL PI INDICATOR 7.0 (GLOVE) ×8
GLOVE INDICATOR 8.5 STRL (GLOVE) ×3 IMPLANT
GLOVE SURG SS PI 7.0 STRL IVOR (GLOVE) ×12 IMPLANT
GOWN BRE IMP SLV AUR LG STRL (GOWN DISPOSABLE) IMPLANT
GOWN BRE IMP SLV AUR XL STRL (GOWN DISPOSABLE) IMPLANT
GOWN STRL REIN 2XL LVL4 (GOWN DISPOSABLE) IMPLANT
GOWN STRL REUS W/ TWL LRG LVL3 (GOWN DISPOSABLE) ×3 IMPLANT
GOWN STRL REUS W/ TWL XL LVL3 (GOWN DISPOSABLE) ×3 IMPLANT
GOWN STRL REUS W/TWL LRG LVL3 (GOWN DISPOSABLE) ×6
GOWN STRL REUS W/TWL XL LVL3 (GOWN DISPOSABLE) ×6
HEMOSTAT POWDER KIT SURGIFOAM (HEMOSTASIS) IMPLANT
KIT BASIN OR (CUSTOM PROCEDURE TRAY) ×3 IMPLANT
KIT NEEDLE NVM5 EMG ELECT (KITS) ×1 IMPLANT
KIT NEEDLE NVM5 EMG ELECTRODE (KITS) ×2
KIT ROOM TURNOVER OR (KITS) ×3 IMPLANT
MILL MEDIUM DISP (BLADE) ×3 IMPLANT
NEEDLE HYPO 25X1 1.5 SAFETY (NEEDLE) ×3 IMPLANT
NS IRRIG 1000ML POUR BTL (IV SOLUTION) ×3 IMPLANT
PACK LAMINECTOMY NEURO (CUSTOM PROCEDURE TRAY) ×3 IMPLANT
PAD ARMBOARD 7.5X6 YLW CONV (MISCELLANEOUS) ×9 IMPLANT
ROD 55MM (Rod) ×4 IMPLANT
ROD SPNL 55XPREBNT NS MAS (Rod) ×2 IMPLANT
SCREW LOCK (Screw) ×12 IMPLANT
SCREW LOCK FXNS SPNE MAS PL (Screw) ×6 IMPLANT
SCREW MAS PLIF 5.5X30 (Screw) ×2 IMPLANT
SCREW PAS PLIF 5X30 (Screw) ×1 IMPLANT
SCREW PLIF MAS 5.0X25MM (Screw) ×3 IMPLANT
SCREW SHANK 6.5X30 (Screw) ×6 IMPLANT
SCREW SHANK PLIF 5.0X25 LUMBAR (Screw) ×6 IMPLANT
SCREW TULIP 5.5 (Screw) ×12 IMPLANT
SPONGE LAP 4X18 X RAY DECT (DISPOSABLE) IMPLANT
SPONGE SURGIFOAM ABS GEL 100 (HEMOSTASIS) ×3 IMPLANT
STRIP CLOSURE SKIN 1/2X4 (GAUZE/BANDAGES/DRESSINGS) ×4 IMPLANT
SUT VIC AB 0 CT1 18XCR BRD8 (SUTURE) ×1 IMPLANT
SUT VIC AB 0 CT1 8-18 (SUTURE) ×2
SUT VIC AB 2-0 CP2 18 (SUTURE) ×3 IMPLANT
SUT VIC AB 3-0 SH 8-18 (SUTURE) ×6 IMPLANT
SYR 20ML ECCENTRIC (SYRINGE) ×3 IMPLANT
SYR 3ML LL SCALE MARK (SYRINGE) IMPLANT
TAPE STRIPS DRAPE STRL (GAUZE/BANDAGES/DRESSINGS) ×3 IMPLANT
TOWEL OR 17X24 6PK STRL BLUE (TOWEL DISPOSABLE) ×3 IMPLANT
TOWEL OR 17X26 10 PK STRL BLUE (TOWEL DISPOSABLE) ×3 IMPLANT
TRAY FOLEY CATH 14FRSI W/METER (CATHETERS) ×3 IMPLANT
WATER STERILE IRR 1000ML POUR (IV SOLUTION) ×3 IMPLANT

## 2013-06-03 NOTE — Op Note (Signed)
06/03/2013  1:07 PM  PATIENT:  Carrie Keller  68 y.o. female  PRE-OPERATIVE DIAGNOSIS:  Lytic spondylolisthesis L5-S1 with foraminal stenosis, L4-5 HNP left with central stenosis and Left L5 nerve root compression, degenerative scoliosis, back and leg pain  POST-OPERATIVE DIAGNOSIS:  same  PROCEDURE:   1. Decompressive lumbar laminectomy L4-5 and L5-S1 requiring more work than would be required for a simple exposure of the disk for PLIF in order to adequately decompress the neural elements and address the spinal stenosis 2. Posterior lumbar interbody fusion L4-5, L5-S1 using PEEK interbody cages packed with morcellized allograft and autograft 3. Posterior fixation L4-5, L5-S1 using cortical pedicle screws.    SURGEON:  Sherley Bounds, MD  ASSISTANTS: CRam  ANESTHESIA:  General  EBL: 200 ml  Total I/O In: 2400 [I.V.:2400] Out: 980 [Urine:780; Blood:200]  BLOOD ADMINISTERED:none  DRAINS: Hemovac   INDICATION FOR PROCEDURE: This pt presented with a long history of back and leg pain. She tried medical therapy without relief. CT/myelo showed lytic spony L5-S1 and large L HNP and stenosis L4-5, as well as scoliosis. I recommended decompression and fusion. Patient understood the risks, benefits, and alternatives and potential outcomes and wished to proceed.  PROCEDURE DETAILS:  The patient was brought to the operating room. After induction of generalized endotracheal anesthesia the patient was rolled into the prone position on chest rolls and all pressure points were padded. The patient's lumbar region was cleaned and then prepped with DuraPrep and draped in the usual sterile fashion. Anesthesia was injected and then a dorsal midline incision was made and carried down to the lumbosacral fascia. The fascia was opened and the paraspinous musculature was taken down in a subperiosteal fashion to expose L4 -S1. A self-retaining retractor was placed. Intraoperative fluoroscopy confirmed my level, and  I started with placement of the L4 cortical pedicle screws. The pedicle screw entry zones were identified utilizing surface landmarks and  AP and lateral fluoroscopy. I scored the cortex with the high-speed drill and then used the hand drill and EMG monitoring to drill an upward and outward direction into the pedicle. I then tapped line to line, and the tap was also monitored. I then placed a 5-0 by 30 mm cortical pedicle screw into the pedicles of L4 bilaterally. I then turned my attention to the decompression and the spinous process was removed and complete lumbar laminectomies, hemi- facetectomies, and foraminotomies were performed at L4-5, L5-S1. The patient had significant spinal stenosis and this required more work than would be required for a simple exposure of the disc for posterior lumbar interbody fusion. Much more generous decompression was undertaken in order to adequately decompress the neural elements and address the patient's leg pain. A large free fragment HNP was found at L4-5 on the left. The yellow ligament was removed to expose the underlying dura and nerve roots, and generous foraminotomies were performed to adequately decompress the neural elements. Both the exiting and traversing nerve roots were decompressed on both sides until a coronary dilator passed easily along the nerve roots. Once the decompression was complete, I turned my attention to the posterior lower lumbar interbody fusion. The epidural venous vasculature was coagulated and cut sharply. Disc space was incised and the initial discectomy was performed with pituitary rongeurs. The disc space was distracted with sequential distractors to a height of 8 mm at each level. We then used a series of scrapers and shavers to prepare the endplates for fusion. The midline was prepared with Epstein curettes. Once the  complete discectomy was finished, we packed an appropriate sized peek interbody cage with local autograft and morcellized  allograft, gently retracted the nerve root, and tapped the cage into position at L4-5 and L5-S1.  The midline between the cages was packed with morselized autograft and allograft. We then turned our attention to the placement of the lower pedicle screws. The pedicle screw entry zones were identified utilizing surface landmarks and fluoroscopy. I drilled into each pedicle utilizing the hand drill and EMG monitoring, and tapped each pedicle with the appropriate tap. We palpated with a ball probe to assure no break in the cortex. We then placed 6.5 by 30 mm pedicle screws into the pedicles bilaterally at S1. 5-0 by 30 mm screws were placed at L5. We then placed lordotic rods into the multiaxial screw heads of the pedicle screws and locked these in position with the locking caps and anti-torque device. We then checked our construct with AP and lateral fluoroscopy. Irrigated with copious amounts of bacitracin-containing saline solution. Placed a medium Hemovac drain through separate stab incision. Inspected the nerve roots once again to assure adequate decompression, lined to the dura with Gelfoam, and closed the muscle and the fascia with 0 Vicryl. Closed the subcutaneous tissues with 2-0 Vicryl and subcuticular tissues with 3-0 Vicryl. The skin was closed with benzoin and Steri-Strips. Dressing was then applied, the patient was awakened from general anesthesia and transported to the recovery room in stable condition. At the end of the procedure all sponge, needle and instrument counts were correct.   PLAN OF CARE: Admit to inpatient   PATIENT DISPOSITION:  PACU - hemodynamically stable.   Delay start of Pharmacological VTE agent (>24hrs) due to surgical blood loss or risk of bleeding:  yes

## 2013-06-03 NOTE — Anesthesia Postprocedure Evaluation (Signed)
  Anesthesia Post-op Note  Patient: Carrie Keller  Procedure(s) Performed: Procedure(s) with comments: FOR MAXIMUM ACCESS (MAS) POSTERIOR LUMBAR INTERBODY FUSION (PLIF) 2 LEVEL (N/A) - FOR MAXIMUM ACCESS (MAS) POSTERIOR LUMBAR INTERBODY FUSION (PLIF) 2 LEVEL  Patient Location: PACU  Anesthesia Type:General  Level of Consciousness: awake, alert , oriented and patient cooperative  Airway and Oxygen Therapy: Patient Spontanous Breathing and Patient connected to nasal cannula oxygen  Post-op Pain: mild  Post-op Assessment: Post-op Vital signs reviewed, Patient's Cardiovascular Status Stable, Respiratory Function Stable, Patent Airway, No signs of Nausea or vomiting and Pain level controlled  Post-op Vital Signs: Reviewed and stable  Complications: No apparent anesthesia complications

## 2013-06-03 NOTE — Anesthesia Procedure Notes (Signed)
Procedure Name: Intubation Date/Time: 06/03/2013 9:02 AM Performed by: Maude Leriche D Pre-anesthesia Checklist: Patient identified, Emergency Drugs available, Suction available, Patient being monitored and Timeout performed Patient Re-evaluated:Patient Re-evaluated prior to inductionOxygen Delivery Method: Circle system utilized Preoxygenation: Pre-oxygenation with 100% oxygen Intubation Type: IV induction Ventilation: Mask ventilation without difficulty Laryngoscope Size: Miller and 2 Grade View: Grade I Tube type: Oral Tube size: 7.5 mm Number of attempts: 1 Airway Equipment and Method: Stylet Placement Confirmation: ETT inserted through vocal cords under direct vision,  positive ETCO2 and breath sounds checked- equal and bilateral Secured at: 22 cm Tube secured with: Tape Dental Injury: Teeth and Oropharynx as per pre-operative assessment

## 2013-06-03 NOTE — H&P (Signed)
Subjective: Patient is a 68 y.o. female admitted for PLIF L4-5, L5-S1 Onset of symptoms was several years ago, gradually worsening since that time.  The pain is rated severe, and is located at the across the lower back and radiates to legs. The pain is described as aching and occurs all day. The symptoms have been progressive. Symptoms are exacerbated by exercise. MRI or CT showed spondylolisthesis and stenosis L4-5, L5-S1   Past Medical History  Diagnosis Date  . Complication of anesthesia   . PONV (postoperative nausea and vomiting)   . Heart murmur 1970    MVP/ asympotmatic  . Depression   . Anxiety     sees Dr Jerl Santos  psychiatrist every 3 month  . DVT (deep venous thrombosis) 2008    RIGHT /POST OP  . Pulmonary embolism 2008  . H/O hiatal hernia   . GERD (gastroesophageal reflux disease)   . Seizures     last seizure 1967- states reaction to promethazine  . Arthritis   . Peripheral vascular disease     venous insufficiency/ bilateral legs  . 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 Hyperlipidemia     Past Surgical History  Procedure Laterality Date  . Joint replacement  2003/2008    bilateral knees  . Tubal ligation    . Knee arthroscopy      right knee  . Tummy tuck    . External ear surgery      "release of nerve behind right ear for bells palsy"  . Nissen fundoplication  37/10  . Total knee revision Right 07/02/2012    Procedure: TOTAL KNEE REVISION;  Surgeon: Gearlean Alf, MD;  Location: [facility];  Service: Orthopedics;  Laterality: Right;  . Colonoscopy      Prior to Admission medications   Medication Sig Start Date End Date Taking? Authorizing Provider  Calcium Carbonate-Vitamin D (CALCIUM-VITAMIN D) 500-200 MG-UNIT per tablet Take 1 tablet by mouth daily.   Yes Historical Provider, MD  DULoxetine (CYMBALTA) 30 MG capsule Take 30 mg by mouth at bedtime.    Yes Historical Provider, MD  DULoxetine (CYMBALTA) 60 MG capsule Take 60 mg  by mouth daily.    Yes Historical Provider, MD  estradiol (ESTRACE) 2 MG tablet Take 2 mg by mouth daily.   Yes Historical Provider, MD  HYDROmorphone (DILAUDID) 2 MG tablet Take 2 mg by mouth every 6 (six) hours as needed for moderate pain or severe pain.   Yes Historical Provider, MD  Multiple Vitamins-Minerals (MULTIVITAMIN WITH MINERALS) tablet Take 1 tablet by mouth daily.   Yes Historical Provider, MD  polyethylene glycol (MIRALAX / GLYCOLAX) packet Take 17 g by mouth daily as needed for mild constipation.    Yes Historical Provider, MD  pramipexole (MIRAPEX) 1 MG tablet Take 2 mg by mouth at bedtime.    Yes Historical Provider, MD  rosuvastatin (CRESTOR) 10 MG tablet Take 10 mg by mouth every morning.   Yes Historical Provider, MD  traZODone (DESYREL) 50 MG tablet Take 50 mg by mouth at bedtime.   Yes Historical Provider, MD  QUEtiapine (SEROQUEL) 50 MG tablet Take 50 mg by mouth at bedtime.    Historical Provider, MD  rivaroxaban (XARELTO) 10 MG TABS tablet Take 1 tablet (10 mg total) by mouth daily with breakfast. Take Xarelto for two and a half more weeks, then discontinue Xarelto. 07/04/12   Alexzandrew Dara Lords, PA-C   Allergies  Allergen Reactions  .  Compazine [Prochlorperazine Edisylate]     Pt reports to having torticollis symptoms and seizure activity with compazine    History  Substance Use Topics  . Smoking status: Former Smoker -- 0.25 packs/day for 10 years    Types: Cigarettes    Quit date: 06/25/1984  . Smokeless tobacco: Never Used  . Alcohol Use: Yes     Comment: 1 glass wine week    History reviewed. No pertinent family history.   Review of Systems  Positive ROS: neg  All other systems have been reviewed and were otherwise negative with the exception of those mentioned in the HPI and as above.  Objective: Vital signs in last 24 hours: Temp:  [97.8 F (36.6 C)] 97.8 F (36.6 C) (01/21 0655) Pulse Rate:  [80] 80 (01/21 0655) Resp:  [20] 20 (01/21 0655) BP:  (143)/(69) 143/69 mmHg (01/21 0655) SpO2:  [95 %] 95 % (01/21 0655)  General Appearance: Alert, cooperative, no distress, appears stated age Head: Normocephalic, without obvious abnormality, atraumatic Eyes: PERRL, conjunctiva/corneas clear, EOM's intact    Neck: Supple, symmetrical, trachea midline Back: Symmetric, no curvature, ROM normal, no CVA tenderness Lungs:  respirations unlabored Heart: Regular rate and rhythm Abdomen: Soft, non-tender Extremities: Extremities normal, atraumatic, no cyanosis or edema Pulses: 2+ and symmetric all extremities Skin: Skin color, texture, turgor normal, no rashes or lesions  NEUROLOGIC:   Mental status: Alert and oriented x4,  no aphasia, good attention span, fund of knowledge, and memory Motor Exam - grossly normal Sensory Exam - grossly normal Reflexes: 1+ Coordination - grossly normal Gait - grossly normal Balance - grossly normal Cranial Nerves: I: smell Not tested  II: visual acuity  OS: nl    OD: nl  II: visual fields Full to confrontation  II: pupils Equal, round, reactive to light  III,VII: ptosis None  III,IV,VI: extraocular muscles  Full ROM  V: mastication Normal  V: facial light touch sensation  Normal  V,VII: corneal reflex  Present  VII: facial muscle function - upper  Normal  VII: facial muscle function - lower Normal  VIII: hearing Not tested  IX: soft palate elevation  Normal  IX,X: gag reflex Present  XI: trapezius strength  5/5  XI: sternocleidomastoid strength 5/5  XI: neck flexion strength  5/5  XII: tongue strength  Normal    Data Review Lab Results  Component Value Date   WBC 7.0 05/26/2013   HGB 13.3 05/26/2013   HCT 39.6 05/26/2013   MCV 93.6 05/26/2013   PLT 296 05/26/2013   Lab Results  Component Value Date   NA 140 05/26/2013   K 4.5 05/26/2013   CL 101 05/26/2013   CO2 29 05/26/2013   BUN 9 05/26/2013   CREATININE 0.71 05/26/2013   GLUCOSE 81 05/26/2013   Lab Results  Component Value Date   INR  0.94 05/26/2013    Assessment/Plan: Patient admitted for PLIF L4-5, L5-S1. Patient has failed a reasonable attempt at conservative therapy.  I explained the condition and procedure to the patient and answered any questions.  Patient wishes to proceed with procedure as planned. Understands risks/ benefits and typical outcomes of procedure.   Tela Kotecki S 06/03/2013 8:34 AM

## 2013-06-03 NOTE — Transfer of Care (Signed)
Immediate Anesthesia Transfer of Care Note  Patient: Carrie Keller  Procedure(s) Performed: Procedure(s) with comments: FOR MAXIMUM ACCESS (MAS) POSTERIOR LUMBAR INTERBODY FUSION (PLIF) 2 LEVEL (N/A) - FOR MAXIMUM ACCESS (MAS) POSTERIOR LUMBAR INTERBODY FUSION (PLIF) 2 LEVEL  Patient Location: PACU  Anesthesia Type:General  Level of Consciousness: sedated  Airway & Oxygen Therapy: Patient Spontanous Breathing and Patient connected to nasal cannula oxygen  Post-op Assessment: Report given to PACU RN and Post -op Vital signs reviewed and stable  Post vital signs: Reviewed and stable  Complications: No apparent anesthesia complications

## 2013-06-03 NOTE — Anesthesia Preprocedure Evaluation (Addendum)
Anesthesia Evaluation  Patient identified by MRN, date of birth, ID band Patient awake    Reviewed: Allergy & Precautions, H&P , NPO status , Patient's Chart, lab work & pertinent test results  History of Anesthesia Complications (+) PONV  Airway Mallampati: II TM Distance: >3 FB Neck ROM: Full    Dental  (+) Teeth Intact and Dental Advisory Given   Pulmonary former smoker (quit '86), PE HO of PE s/p TKR 2008 breath sounds clear to auscultation  Pulmonary exam normal       Cardiovascular DVT Rhythm:Regular Rate:Normal     Neuro/Psych Seizures -,  Anxiety Depression h/o one seizure with compazine at age 68    GI/Hepatic Neg liver ROS, hiatal hernia, GERD- (resolved with [employer])  Controlled,  Endo/Other  negative endocrine ROS  Renal/GU negative Renal ROS     Musculoskeletal   Abdominal (+) + obese,   Peds  Hematology negative hematology ROS (+)   Anesthesia Other Findings   Reproductive/Obstetrics                          Anesthesia Physical Anesthesia Plan  ASA: II  Anesthesia Plan: General   Post-op Pain Management:    Induction: Intravenous  Airway Management Planned: Oral ETT  Additional Equipment:   Intra-op Plan:   Post-operative Plan: Extubation in OR  Informed Consent: I have reviewed the patients History and Physical, chart, labs and discussed the procedure including the risks, benefits and alternatives for the proposed anesthesia with the patient or authorized representative who has indicated his/her understanding and acceptance.   Dental advisory given  Plan Discussed with: CRNA and Surgeon  Anesthesia Plan Comments: (Plan routine monitors, GETA)       Anesthesia Quick Evaluation

## 2013-06-03 NOTE — Progress Notes (Signed)
Utilization review completed.  

## 2013-06-04 MED ORDER — HYDROMORPHONE HCL 2 MG PO TABS: 2.0000 mg | ORAL_TABLET | ORAL | Status: DC | PRN

## 2013-06-04 MED ORDER — METHOCARBAMOL 500 MG PO TABS: 500.0000 mg | ORAL_TABLET | Freq: Four times a day (QID) | ORAL | Status: DC | PRN

## 2013-06-04 NOTE — Progress Notes (Signed)
Occupational Therapy Evaluation Patient Details Name: Carrie Keller MRN: [id number] DOB: 04-11-1946 Today's Date: 06/04/2013 Time: 4627-0350 OT Time Calculation (min): 20 min  OT Assessment / Plan / Recommendation History of present illness s/p L4-5 MAS PLIF   Clinical Impression   PTA, pt independent with ADL and mobility. Educated pt on back precuations and ADL/mobility.will return for review of education.    OT Assessment  Patient needs continued OT Services    Follow Up Recommendations  No OT follow up    Barriers to Discharge      Equipment Recommendations  None recommended by OT    Recommendations for Other Services    Frequency  Min 2X/week    Precautions / Restrictions Precautions Precautions: Back Precaution Booklet Issued: Yes (comment) Precaution Comments: Able to verbalize precautions Required Braces or Orthoses: Spinal Brace Restrictions Weight Bearing Restrictions: No   Pertinent Vitals/Pain no apparent distress     ADL  Grooming: Supervision/safety;Set up Where Assessed - Grooming: Unsupported sitting Upper Body Bathing: Set up;Supervision/safety Where Assessed - Upper Body Bathing: Unsupported sitting Lower Body Bathing: Minimal assistance Where Assessed - Lower Body Bathing: Unsupported sit to stand Upper Body Dressing: Supervision/safety;Set up Where Assessed - Upper Body Dressing: Unsupported sitting Lower Body Dressing: Minimal assistance Where Assessed - Lower Body Dressing: Unsupported sit to stand Toilet Transfer: Supervision/safety Toilet Transfer Method: Other (comment) (ambulaing) Toilet Transfer Equipment: Comfort height toilet Toileting - Clothing Manipulation and Hygiene: Minimal assistance Where Assessed - Best boy and Hygiene: Sit to stand from 3-in-1 or toilet Tub/Shower Transfer: Other (comment) (discussed safe transfer technique) Equipment Used: Back brace Transfers/Ambulation Related to ADLs: S ADL  Comments: Will benefit from AE    OT Diagnosis: Generalized weakness;Acute pain  OT Problem List: Decreased safety awareness;Decreased knowledge of use of DME or AE;Decreased knowledge of precautions;Pain OT Treatment Interventions: Self-care/ADL training;DME and/or AE instruction;Therapeutic activities;Patient/family education   OT Goals(Current goals can be found in the care plan section) Acute Rehab OT Goals Patient Stated Goal: to get back to shagging OT Goal Formulation: With patient Time For Goal Achievement: 06/18/13 Potential to Achieve Goals: Good  Visit Information  Last OT Received On: 06/04/13 History of Present Illness: s/p L4-5 MAS PLIF       Prior [place] expects to be discharged to:: Private residence Available Help at Discharge: Family;Available PRN/intermittently;Available 24 hours/day (sister available 24/7 initially) Type of Home: House Home Access: Level entry Home Layout: One level Home Equipment: Bedside commode;Walker - 2 wheels Prior Function Level of Independence: Independent Communication Communication: No difficulties Dominant Hand: Right         Vision/Perception Vision - History Baseline Vision: No visual deficits   Cognition  Cognition Arousal/Alertness: Awake/alert Behavior During Therapy: WFL for tasks assessed/performed Overall Cognitive Status: Within Functional Limits for tasks assessed    Extremity/Trunk Assessment Upper Extremity Assessment Upper Extremity Assessment: Overall WFL for tasks assessed Lower Extremity Assessment Lower Extremity Assessment: Overall WFL for tasks assessed Cervical / Trunk Assessment Cervical / Trunk Assessment: Normal     Mobility Bed Mobility Overal bed mobility: Needs Assistance Bed Mobility: Sidelying to Sit Sidelying to sit: Supervision Transfers Overall transfer level: Needs assistance Equipment used: None Transfers: Sit to/from Stand (S) Sit to  Stand: Supervision General transfer comment: Educated about transfer techniques with back precautions     Exercise     Balance Balance Overall balance assessment: Modified Independent   End of Session OT - End of Session Equipment Utilized  During Treatment: Back brace Activity Tolerance: Patient tolerated treatment well Patient left: in chair;with call bell/phone within reach Nurse Communication: Mobility status;Precautions  GO     Jamere Stidham,HILLARY 06/04/2013, 12:30 PM [facility], OTR/L  [phone] 06/04/2013

## 2013-06-04 NOTE — Progress Notes (Signed)
Pt doing well. Pt given D/C instructions with Rx's, verbal understanding was given.  Pt D/C'd home via wheelchair @ 1300 per MD order. Pt stable @ D/C and had no other needs at this time. Holli Humbles, RN

## 2013-06-04 NOTE — Discharge Summary (Signed)
Physician Discharge Summary  Patient ID: Carrie Keller MRN: [id number] DOB/AGE: [birth year] 70 y.o.  Admit date: 06/03/2013 Discharge date: 06/04/2013  Admission Diagnoses: scoliosis, lytic spondylolisthesis, stenosis   Discharge Diagnoses: same   Discharged Condition: good  Hospital Course: The patient was admitted on 06/03/2013 and taken to the operating room where the patient underwent PLIF L4-5, L5-S1. The patient tolerated the procedure well and was taken to the recovery room and then to the floor in stable condition. The hospital course was routine. There were no complications. The wound remained clean dry and intact. Pt had appropriate back soreness. No complaints of leg pain or new N/T/W. The patient remained afebrile with stable vital signs, and tolerated a regular diet. The patient continued to increase activities, and pain was well controlled with oral pain medications.   Consults: None  Significant Diagnostic Studies:  Results for orders placed during the hospital encounter of 05/26/13  SURGICAL PCR SCREEN      Result Value Range   MRSA, PCR NEGATIVE  NEGATIVE   Staphylococcus aureus POSITIVE (*) NEGATIVE  BASIC METABOLIC PANEL      Result Value Range   Sodium 140  137 - 147 mEq/L   Potassium 4.5  3.7 - 5.3 mEq/L   Chloride 101  96 - 112 mEq/L   CO2 29  19 - 32 mEq/L   Glucose, Bld 81  70 - 99 mg/dL   BUN 9  6 - 23 mg/dL   Creatinine, Ser 0.71  0.50 - 1.10 mg/dL   Calcium 9.3  8.4 - 10.5 mg/dL   GFR calc non Af Amer 86 (*) >90 mL/min   GFR calc Af Amer >90  >90 mL/min  CBC WITH DIFFERENTIAL      Result Value Range   WBC 7.0  4.0 - 10.5 K/uL   RBC 4.23  3.87 - 5.11 MIL/uL   Hemoglobin 13.3  12.0 - 15.0 g/dL   HCT 39.6  36.0 - 46.0 %   MCV 93.6  78.0 - 100.0 fL   MCH 31.4  26.0 - 34.0 pg   MCHC 33.6  30.0 - 36.0 g/dL   RDW 15.0  11.5 - 15.5 %   Platelets 296  150 - 400 K/uL   Neutrophils Relative % 68  43 - 77 %   Neutro Abs 4.7  1.7 - 7.7 K/uL   Lymphocytes  Relative 23  12 - 46 %   Lymphs Abs 1.6  0.7 - 4.0 K/uL   Monocytes Relative 7  3 - 12 %   Monocytes Absolute 0.5  0.1 - 1.0 K/uL   Eosinophils Relative 1  0 - 5 %   Eosinophils Absolute 0.1  0.0 - 0.7 K/uL   Basophils Relative 0  0 - 1 %   Basophils Absolute 0.0  0.0 - 0.1 K/uL  PROTIME-INR      Result Value Range   Prothrombin Time 12.4  11.6 - 15.2 seconds   INR 0.94  0.00 - 1.49  TYPE AND SCREEN      Result Value Range   ABO/RH(D) O POS     Antibody Screen NEG     Sample Expiration 06/09/2013    ABO/RH      Result Value Range   ABO/RH(D) O POS      Chest 2 View  05/26/2013   CLINICAL DATA:  Preoperative study prior to lumbar surgery  EXAM: CHEST  2 VIEW  COMPARISON:  None.  FINDINGS: The right hemidiaphragm is higher than  the left. The lungs are clear. The cardiac silhouette is normal in size. The mediastinum is normal in width. There is no pleural effusion or pneumothorax. There is S shaped curvature of the thoracolumbar spine.  IMPRESSION: There is no evidence of acute cardiopulmonary disease. There is mild elevation of the right hemidiaphragm that is of uncertain etiology and duration.   Electronically Signed   By: Xavion Muscat  [place]   On: 05/26/2013 13:06   Dg Lumbar Spine 2-3 Views  06/03/2013   CLINICAL DATA:  Lumbar disc protrusion.  EXAM: LUMBAR SPINE - 2-3 VIEW; DG C-ARM GT 120 MIN  COMPARISON:  CT myelogram dated 04/21/2013  FINDINGS: AP and lateral C-arm images demonstrate to the patient has undergone interbody and posterior fusion at L4-5 and L5-S1. Hardware appears in good position in the AP and lateral projections.  IMPRESSION: Interbody and posterior fusions performed at L4-5 and L5-S1.   Electronically Signed   By: Rozetta Nunnery M.D.   On: 06/03/2013 14:13   Dg C-arm Gt 120 Min  06/03/2013   CLINICAL DATA:  Lumbar disc protrusion.  EXAM: LUMBAR SPINE - 2-3 VIEW; DG C-ARM GT 120 MIN  COMPARISON:  CT myelogram dated 04/21/2013  FINDINGS: AP and lateral C-arm images  demonstrate to the patient has undergone interbody and posterior fusion at L4-5 and L5-S1. Hardware appears in good position in the AP and lateral projections.  IMPRESSION: Interbody and posterior fusions performed at L4-5 and L5-S1.   Electronically Signed   By: Rozetta Nunnery M.D.   On: 06/03/2013 14:13    Antibiotics:  Anti-infectives   Start     Dose/Rate Route Frequency Ordered Stop   06/03/13 1700  ceFAZolin (ANCEF) IVPB 1 g/50 mL premix     1 g 100 mL/hr over 30 Minutes Intravenous Every 8 hours 06/03/13 1502 06/04/13 0134   06/03/13 0918  bacitracin 50,000 Units in sodium chloride irrigation 0.9 % 500 mL irrigation  Status:  Discontinued       As needed 06/03/13 0919 06/03/13 1255   06/03/13 0600  ceFAZolin (ANCEF) IVPB 2 g/50 mL premix     2 g 100 mL/hr over 30 Minutes Intravenous On call to O.R. 06/02/13 1424 06/03/13 0901      Discharge Exam: Blood pressure 105/67, pulse 86, temperature 98 F (36.7 C), temperature source Oral, resp. rate 16, SpO2 93.00%. Neurologic: Grossly normal Incision CDI  Discharge Medications:     Medication List         calcium-vitamin D 500-200 MG-UNIT per tablet  Take 1 tablet by mouth daily.     DULoxetine 30 MG capsule  Commonly known as:  CYMBALTA  Take 30 mg by mouth at bedtime.     DULoxetine 60 MG capsule  Commonly known as:  CYMBALTA  Take 60 mg by mouth daily.     estradiol 2 MG tablet  Commonly known as:  ESTRACE  Take 2 mg by mouth daily.     HYDROmorphone 2 MG tablet  Commonly known as:  DILAUDID  Take 1 tablet (2 mg total) by mouth every 4 (four) hours as needed for moderate pain or severe pain.     methocarbamol 500 MG tablet  Commonly known as:  ROBAXIN  Take 1 tablet (500 mg total) by mouth every 6 (six) hours as needed for muscle spasms.     multivitamin with minerals tablet  Take 1 tablet by mouth daily.     polyethylene glycol packet  Commonly known as:  MIRALAX /  GLYCOLAX  Take 17 g by mouth daily as needed  for mild constipation.     pramipexole 1 MG tablet  Commonly known as:  MIRAPEX  Take 2 mg by mouth at bedtime.     QUEtiapine 50 MG tablet  Commonly known as:  SEROQUEL  Take 50 mg by mouth at bedtime.     rivaroxaban 10 MG Tabs tablet  Commonly known as:  XARELTO  Take 1 tablet (10 mg total) by mouth daily with breakfast. Take Xarelto for two and a half more weeks, then discontinue Xarelto.     rosuvastatin 10 MG tablet  Commonly known as:  CRESTOR  Take 10 mg by mouth every morning.     traZODone 50 MG tablet  Commonly known as:  DESYREL  Take 50 mg by mouth at bedtime.        Disposition: home   Final Dx: PLIF L4-5, L5-S1      Discharge Orders   Future Orders Complete By Expires   Call MD for:  difficulty breathing, headache or visual disturbances  As directed    Call MD for:  persistant nausea and vomiting  As directed    Call MD for:  redness, tenderness, or signs of infection (pain, swelling, redness, odor or green/yellow discharge around incision site)  As directed    Call MD for:  severe uncontrolled pain  As directed    Call MD for:  temperature >100.4  As directed    Diet - low sodium heart healthy  As directed    Discharge instructions  As directed    Comments:     No bending or twisting, no heavy lifting, may shower normally   Increase activity slowly  As directed    Remove dressing in 48 hours  As directed    Scheduling Instructions:     Leave steristrips in place      Follow-up Information   Follow up with Vidya Bamford S, MD. Schedule an appointment as soon as possible for a visit in 2 weeks.   Specialty:  Neurosurgery   Contact information:   [address] [place] [address] [phone]        Signed: Eustace Moore 06/04/2013, 9:54 AM

## 2013-06-04 NOTE — Progress Notes (Signed)
Occupational Therapy Treatment Patient Details Name: Carrie Keller MRN: [id number] DOB: Feb 19, 1946 Today's Date: 06/04/2013 Time: 1000-1030 OT Time Calculation (min): 30 min  OT Assessment / Plan / Recommendation  History of present illness s/p L4-5 MAS PLIF   OT comments  Pt seen for additional session due to pt's concerns over her ability to complete self care and basic IADL tasks after D/C. Pt able to return demonstrate back precautions and is @ mod I level with ADL and mobility. Pt ready to D/C. All education completed.  Follow Up Recommendations  No OT follow up    Barriers to Discharge       Equipment Recommendations  None recommended by OT    Recommendations for Other Services    Frequency Min 2X/week   Progress towards OT Goals Progress towards OT goals: Goals met/education completed, patient discharged from [facility] Discharge plan remains appropriate    Precautions / Restrictions Precautions Precautions: Back Precaution Booklet Issued: Yes (comment) Precaution Comments: able to return demonstrate precuations Required Braces or Orthoses: Spinal Brace   Pertinent Vitals/Pain no apparent distress     ADL   Equipment Used: Back brace;Long-handled sponge;Reacher;Sock aid Transfers/Ambulation Related to ADLs: mod I ADL Comments: Pt completing ADL @ mod I level with AE. Educated on IADL tasks following back precautions/AE.  Given written information on all information discussed.  OT Diagnosis: Generalized weakness;Acute pain  OT Problem List: Decreased safety awareness;Decreased knowledge of use of DME or AE;Decreased knowledge of precautions;Pain OT Treatment Interventions: Self-care/ADL training;DME and/or AE instruction;Therapeutic activities;Patient/family education   OT Goals(current goals can now be found in the care plan section) Acute Rehab OT Goals Patient Stated Goal: to get back to shagging OT Goal Formulation: With patient Time For Goal Achievement:  06/18/13 Potential to Achieve Goals: Good ADL Goals Pt Will Perform Grooming: with modified independence;standing Pt Will Perform Lower Body Bathing: with modified independence;with adaptive equipment;sit to/from stand Pt Will Perform Lower Body Dressing: with modified independence;with adaptive equipment;sit to/from stand Pt Will Transfer to Toilet: with modified independence;ambulating;regular height toilet Pt Will Perform Toileting - Clothing Manipulation and hygiene: with modified independence;sit to/from stand;with adaptive equipment  Visit Information  Last OT Received On: 06/04/13 History of Present Illness: s/p L4-5 MAS PLIF    Subjective Data      Prior Functioning  Home Living Family/patient expects to be discharged to:: Private residence Available Help at Discharge: Family;Available PRN/intermittently;Available 24 hours/day (sister available 24/7 initially) Type of Home: House Home Access: Level entry Home Layout: One level Home Equipment: Bedside commode;Walker - 2 wheels Prior Function Level of Independence: Independent Communication Communication: No difficulties Dominant Hand: Right    Cognition  Cognition Arousal/Alertness: Awake/alert Behavior During Therapy: WFL for tasks assessed/performed Overall Cognitive Status: Within Functional Limits for tasks assessed    Mobility  Bed Mobility Overal bed mobility: Modified Independent Transfers Overall transfer level: Modified independent Equipment used: None  General transfer comment: Able to demonstrate mobility @ mod I level    Exercises      Balance Balance Overall balance assessment: Modified Independent  End of Session OT - End of Session Equipment Utilized During Treatment: Back brace Activity Tolerance: Patient tolerated treatment well Patient left: in chair;with call bell/phone within reach Nurse Communication: Mobility status;Precautions;Other (comment) (ready for D/C)  GO      Arnell Slivinski,HILLARY 06/04/2013, 12:39 PM [facility], OTR/L  [phone] 06/04/2013

## 2013-06-05 ENCOUNTER — Encounter (HOSPITAL_COMMUNITY): Payer: Self-pay | Admitting: Neurological Surgery

## 2013-06-08 MED FILL — Heparin Sodium (Porcine) Inj 1000 Unit/ML: INTRAMUSCULAR | Qty: 30 | Status: AC

## 2013-06-08 MED FILL — Sodium Chloride IV Soln 0.9%: INTRAVENOUS | Qty: 1000 | Status: AC

## 2013-07-15 ENCOUNTER — Encounter: Payer: Self-pay | Admitting: Podiatry

## 2013-07-15 ENCOUNTER — Ambulatory Visit (INDEPENDENT_AMBULATORY_CARE_PROVIDER_SITE_OTHER): Payer: Medicare Other

## 2013-07-15 ENCOUNTER — Ambulatory Visit (INDEPENDENT_AMBULATORY_CARE_PROVIDER_SITE_OTHER): Payer: Medicare Other | Admitting: Podiatry

## 2013-07-15 VITALS — BP 124/77 | HR 86 | Resp 12

## 2013-07-15 DIAGNOSIS — R52 Pain, unspecified: Secondary | ICD-10-CM

## 2013-07-15 DIAGNOSIS — M722 Plantar fascial fibromatosis: Secondary | ICD-10-CM

## 2013-07-15 MED ORDER — TRIAMCINOLONE ACETONIDE 10 MG/ML IJ SUSP
10.0000 mg | Freq: Once | INTRAMUSCULAR | Status: AC
  Administered 2013-07-15: 10 mg

## 2013-07-15 NOTE — Patient Instructions (Signed)

## 2013-07-15 NOTE — Progress Notes (Signed)
   Subjective:    Patient ID: Carrie Keller, female    DOB: March 16, 1946, 68 y.o.   MRN: [id number]  HPI PT STATED LT FOOT IS HURTING FOR 6 WEEKS. THE FOOT GET AGGRAVATED FIRST THING IN THE MORNING AND PUTTING PRESSURE. TRIED TO TAKE IBUPROFEN AND IT HELPS A LOT.    Review of Systems  HENT: Positive for hearing loss and sinus pressure.   Hematological: Bruises/bleeds easily.  All other systems reviewed and are negative.       Objective:   Physical Exam        Assessment & Plan:

## 2013-07-16 NOTE — Progress Notes (Signed)
Subjective:     Patient ID: Carrie Keller, female   DOB: Apr 29, 1946, 68 y.o.   MRN: [id number]  HPI patient presents stating my left heel has really been bothering me since I had back surgery and it does not seem to be related to I nerve bruise. States it's worse when getting up in the morning or after periods of sitting   Review of Systems  All other systems reviewed and are negative.       Objective:   Physical Exam  Nursing note and vitals reviewed. Constitutional: She is oriented to person, place, and time.  Cardiovascular: Intact distal pulses.   Musculoskeletal: Normal range of motion.  Neurological: She is oriented to person, place, and time.  Skin: Skin is warm.   neurovascular status intact with patient well oriented and range of motion good in the subtalar midtarsal joint with no equinus condition noted. Patient did not appear to have an issue as far as sharp dull or vibratory and I noted exquisite discomfort left plantar heel at the insertional point of the tendon into the calcaneus     Assessment:     Appears to be plantar fasciitis left versus I nerve issue    Plan:     H&P and x-rays were reviewed. Injected the left plantar fascia 3 mg Kenalog 5 mg Xylocaine Marcaine mixture and applied fascially brace to the arch. Reappoint one week

## 2013-07-22 ENCOUNTER — Ambulatory Visit (INDEPENDENT_AMBULATORY_CARE_PROVIDER_SITE_OTHER): Payer: Medicare Other | Admitting: Podiatry

## 2013-07-22 ENCOUNTER — Encounter: Payer: Self-pay | Admitting: Podiatry

## 2013-07-22 VITALS — BP 166/88 | HR 98 | Resp 18 | Ht 61.5 in | Wt 163.0 lb

## 2013-07-22 DIAGNOSIS — M722 Plantar fascial fibromatosis: Secondary | ICD-10-CM

## 2013-07-22 DIAGNOSIS — G589 Mononeuropathy, unspecified: Secondary | ICD-10-CM

## 2013-07-22 MED ORDER — GABAPENTIN 300 MG PO CAPS: ORAL_CAPSULE | ORAL | Status: DC

## 2013-07-22 MED ORDER — TRIAMCINOLONE ACETONIDE 10 MG/ML IJ SUSP
10.0000 mg | Freq: Once | INTRAMUSCULAR | Status: AC
  Administered 2013-07-22: 10 mg

## 2013-07-22 NOTE — Progress Notes (Signed)
   Subjective:    Patient ID: Carrie Keller, female    DOB: Jan 31, 1946, 68 y.o.   MRN: [id number] Pt states her left heel had hurt so bad previously, that it must have been covering up the neuropathy, because now her feet plantarlylHPI Review of Systems     Objective:   Physical Exam        Assessment & Plan:

## 2013-07-22 NOTE — Progress Notes (Signed)
Subjective:     Patient ID: Carrie Keller, female   DOB: 1945-10-19, 68 y.o.   MRN: [id number]  HPI patient presents stating I am still having pain underneath my left heel and I am starting to get burning in both my feet it is becoming more noticeable   Review of Systems     Objective:   Physical Exam Neurovascular status unchanged with exquisite tenderness left plantar heel and generalized foot pain    Assessment:     Plantar fasciitis left with probable low to mid great neuropathy    Plan:     Reviewed neuropathy and at this time injected the plantar fascial left 3 mg Kenalog 5 mg Xylocaine Marcaine mixture and start on low-dose Neurontin to see if we can control her nighttime burning pain and I educated her on this. Reappoint 2 weeks for also consideration of orthotic therapy

## 2013-07-29 ENCOUNTER — Telehealth: Payer: Self-pay | Admitting: *Deleted

## 2013-07-29 ENCOUNTER — Other Ambulatory Visit: Payer: Self-pay | Admitting: *Deleted

## 2013-07-29 MED ORDER — GABAPENTIN 300 MG PO CAPS: ORAL_CAPSULE | ORAL | Status: DC

## 2013-07-29 NOTE — Telephone Encounter (Signed)
I informed pt, I reordered the Neurotin through the [facility] on [address] in [place], and she may still receive the mail order as well.  I told her she could use that as a refill, if it arrived.

## 2013-08-03 ENCOUNTER — Encounter: Payer: Self-pay | Admitting: Podiatry

## 2013-08-04 ENCOUNTER — Encounter: Payer: Self-pay | Admitting: Podiatry

## 2013-08-05 ENCOUNTER — Ambulatory Visit: Payer: Medicare Other | Admitting: Podiatry

## 2013-08-17 ENCOUNTER — Ambulatory Visit (INDEPENDENT_AMBULATORY_CARE_PROVIDER_SITE_OTHER): Payer: Medicare Other | Admitting: Podiatry

## 2013-08-17 ENCOUNTER — Encounter: Payer: Self-pay | Admitting: Podiatry

## 2013-08-17 VITALS — BP 135/83 | HR 107 | Resp 16

## 2013-08-17 DIAGNOSIS — M722 Plantar fascial fibromatosis: Secondary | ICD-10-CM

## 2013-08-17 NOTE — Progress Notes (Signed)
Subjective:     Patient ID: Carrie Keller, female   DOB: [birth year], 68 y.o.   MRN: [id number]  HPI patient states my left heel is improving but it still does get soreif i walk all day but so far very pleased   Review of Systems     Objective:   Physical Exam Neurovascular status was found to be intact with discomfort that has reduced quite significantly in the plantar heel left    Assessment:     Plantar fasciitis improving left with conservative measures    Plan:     Advised on physical therapy supportive shoe gear usage and ice therapy. Scanned for custom orthotics to reduce plantar stress against the heel

## 2013-09-11 ENCOUNTER — Other Ambulatory Visit: Payer: Medicare Other

## 2013-09-18 ENCOUNTER — Ambulatory Visit (INDEPENDENT_AMBULATORY_CARE_PROVIDER_SITE_OTHER): Payer: Medicare Other | Admitting: *Deleted

## 2013-09-18 DIAGNOSIS — M722 Plantar fascial fibromatosis: Secondary | ICD-10-CM

## 2013-09-18 NOTE — Progress Notes (Signed)
° °  Subjective:    Patient ID: Carrie Keller, female    DOB: 01/30/1946, 68 y.o.   MRN: [id number]  HPI I am here to get my inserts     Review of Systems     Objective:   Physical Exam        Assessment & Plan:

## 2013-09-18 NOTE — Patient Instructions (Signed)

## 2013-09-28 ENCOUNTER — Ambulatory Visit: Payer: Medicare Other | Admitting: Podiatry

## 2013-10-01 ENCOUNTER — Encounter: Payer: Self-pay | Admitting: Podiatry

## 2013-10-01 ENCOUNTER — Ambulatory Visit (INDEPENDENT_AMBULATORY_CARE_PROVIDER_SITE_OTHER): Payer: Medicare Other | Admitting: Podiatry

## 2013-10-01 VITALS — BP 143/89 | HR 90 | Resp 16

## 2013-10-01 DIAGNOSIS — M722 Plantar fascial fibromatosis: Secondary | ICD-10-CM

## 2013-10-01 MED ORDER — TRIAMCINOLONE ACETONIDE 10 MG/ML IJ SUSP
10.0000 mg | Freq: Once | INTRAMUSCULAR | Status: AC
  Administered 2013-10-01: 10 mg

## 2013-10-01 NOTE — Progress Notes (Signed)
Subjective:     Patient ID: Carrie Keller, female   DOB: 07-15-1945, 68 y.o.   MRN: [id number]  HPI patient presents stating she likes her orthotics but her heel is still hurting her at times   Review of Systems     Objective:   Physical Exam Neurovascular status intact with an area of continued discomfort plantar heel around the insertion into the calcaneus    Assessment:     Plantar fasciitis left with inflammation and fluid buildup    Plan:     Discussed continued physical therapy orthotic usage and reinjected the plantar fascia 3 mg Kenalog 5 mg Xylocaine Marcaine mixture

## 2013-10-01 NOTE — Progress Notes (Signed)
   Subjective:    Patient ID: Carrie Keller, female    DOB: 07-14-1945, 68 y.o.   MRN: [id number]  HPI  Pt c/o pf pain on left foot, pain " comes and goes"  Rest and elevation relieves pain  Review of Systems     Objective:   Physical Exam        Assessment & Plan:

## 2013-12-03 ENCOUNTER — Ambulatory Visit (INDEPENDENT_AMBULATORY_CARE_PROVIDER_SITE_OTHER): Payer: Medicare Other | Admitting: Podiatry

## 2013-12-03 ENCOUNTER — Encounter: Payer: Self-pay | Admitting: Podiatry

## 2013-12-03 VITALS — BP 109/73 | HR 92 | Resp 16

## 2013-12-03 DIAGNOSIS — M722 Plantar fascial fibromatosis: Secondary | ICD-10-CM

## 2013-12-03 DIAGNOSIS — G589 Mononeuropathy, unspecified: Secondary | ICD-10-CM

## 2013-12-03 MED ORDER — GABAPENTIN 300 MG PO CAPS: 300.0000 mg | ORAL_CAPSULE | Freq: Three times a day (TID) | ORAL | Status: DC

## 2013-12-04 NOTE — Progress Notes (Signed)
Subjective:     Patient ID: Carrie Keller, female   DOB: Oct 25, 1945, 68 y.o.   MRN: [id number]  HPI patient continues to experience burning in the heel that intensifies with activity and at nighttime.   Review of Systems     Objective:   Physical Exam Neurovascular status unchanged with continued discomfort in the heel with also multiple signs of probable neuropathic like pathology    Assessment:     Combination of plantar fasciitis and probable neuropathic pathology    Plan:     Advised on physical therapy and did go ahead today and dispensed night splint with instructions on usage and also went ahead and increased Neurontin usage to try to help with the burning the patient is experiencing patient will take one a night one in the morning and at times 2 at night if needed

## 2013-12-31 ENCOUNTER — Ambulatory Visit: Payer: Medicare Other | Admitting: Podiatry

## 2014-01-07 ENCOUNTER — Ambulatory Visit (INDEPENDENT_AMBULATORY_CARE_PROVIDER_SITE_OTHER): Payer: Medicare Other | Admitting: Podiatry

## 2014-01-07 ENCOUNTER — Ambulatory Visit: Payer: Medicare Other | Admitting: Podiatry

## 2014-01-07 VITALS — BP 119/75 | HR 104 | Resp 16

## 2014-01-07 DIAGNOSIS — M722 Plantar fascial fibromatosis: Secondary | ICD-10-CM

## 2014-01-07 DIAGNOSIS — G589 Mononeuropathy, unspecified: Secondary | ICD-10-CM

## 2014-01-07 NOTE — Progress Notes (Signed)
Subjective:     Patient ID: Carrie Keller, female   DOB: 07-May-1946, 68 y.o.   MRN: [id number]  HPI patient presents stating my heel is still killing me on my left foot and I been unable to do any the activities I want to do. States that she has been using her night splint in the evening   Review of Systems     Objective:   Physical Exam Neurovascular status intact with muscle strength adequate and digits well perfused. Patient is well oriented x3 and has exquisite discomfort plantar aspect left heel and also is complaining of burning still in the bottom of her arch and foot    Assessment:     Plan her fasciitis left with intense discomfort and probable neuropathy bilateral    Plan:     Reviewed condition and discussed different treatment options. She would like to avoid open cutting surgery and at this point I have recommended shockwave therapy and reviewed E. Pat treatment for this condition. She wants to have this done and she is dispensed a air fracture walker to begin using and will be appointed for her first treatment next week

## 2014-01-12 ENCOUNTER — Other Ambulatory Visit: Payer: Self-pay | Admitting: Orthopedic Surgery

## 2014-01-12 NOTE — Progress Notes (Signed)
Preoperative surgical orders have been place into the Epic hospital system for Carrie Keller on 01/12/2014, 10:01 AM  by Mickel Crow for surgery on 01/27/2014.  Preop Knee Scope orders including IV Tylenol and IV Decadron as long as there are no contraindications to the above medications. Arlee Muslim, PA-C

## 2014-01-15 ENCOUNTER — Encounter (HOSPITAL_COMMUNITY): Payer: Self-pay | Admitting: Pharmacy Technician

## 2014-01-19 ENCOUNTER — Encounter (HOSPITAL_COMMUNITY)
Admission: RE | Admit: 2014-01-19 | Discharge: 2014-01-19 | Disposition: A | Discharge status: Home / Self Care | Payer: Medicare Other | Source: Ambulatory Visit | Attending: Orthopedic Surgery | Admitting: Orthopedic Surgery

## 2014-01-19 ENCOUNTER — Encounter (HOSPITAL_COMMUNITY): Payer: Self-pay

## 2014-01-19 DIAGNOSIS — Z01818 Encounter for other preprocedural examination: Secondary | ICD-10-CM | POA: Diagnosis present

## 2014-01-19 DIAGNOSIS — R29898 Other symptoms and signs involving the musculoskeletal system: Secondary | ICD-10-CM | POA: Diagnosis present

## 2014-01-19 HISTORY — DX: Plantar fascial fibromatosis: M72.2

## 2014-01-19 HISTORY — DX: Restless legs syndrome: G25.81

## 2014-01-19 HISTORY — DX: Pain, unspecified: R52

## 2014-01-19 HISTORY — DX: Scoliosis, unspecified: M41.9

## 2014-01-19 HISTORY — DX: Polyneuropathy, unspecified: G62.9

## 2014-01-19 HISTORY — DX: Shortness of breath: R06.02

## 2014-01-19 LAB — BASIC METABOLIC PANEL
Anion gap: 10 (ref 5–15)
BUN: 12 mg/dL (ref 6–23)
CALCIUM: 9.8 mg/dL (ref 8.4–10.5)
CO2: 28 mEq/L (ref 19–32)
Chloride: 101 mEq/L (ref 96–112)
Creatinine, Ser: 0.69 mg/dL (ref 0.50–1.10)
GFR calc Af Amer: >90 mL/min (ref >90)
GFR calc non Af Amer: 86 mL/min — ABNORMAL LOW (ref >90)
GLUCOSE: 98 mg/dL (ref 70–99)
POTASSIUM: 4.8 meq/L (ref 3.7–5.3)
Sodium: 139 mEq/L (ref 137–147)

## 2014-01-19 LAB — CBC
HCT: 40.3 % (ref 36.0–46.0)
HEMOGLOBIN: 12.8 g/dL (ref 12.0–15.0)
MCH: 30 pg (ref 26.0–34.0)
MCHC: 31.8 g/dL (ref 30.0–36.0)
MCV: 94.6 fL (ref 78.0–100.0)
Platelets: 263 10*3/uL (ref 150–400)
RBC: 4.26 MIL/uL (ref 3.87–5.11)
RDW: 17.1 % — ABNORMAL HIGH (ref 11.5–15.5)
WBC: 6.9 10*3/uL (ref 4.0–10.5)

## 2014-01-19 NOTE — Patient Instructions (Addendum)
YOUR SURGERY IS SCHEDULED AT [facility]  ON:  Wednesday  9/16  REPORT TO  SHORT STAY CENTER AT:  3:30 PM   PLEASE COME IN THE [place] ENTRANCE AND FOLLOW SIGNS TO SHORT STAY CENTER.  DO NOT EAT  ANYTHING AFTER MIDNIGHT THE NIGHT BEFORE YOUR SURGERY.  NO FOOD, NO CHEWING GUM, NO MINTS, NO CANDIES, NO CHEWING TOBACCO. YOU MAY HAVE CLEAR LIQUIDS TO DRINK FROM MIDNIGHT UNTIL 11:30 AM DAY OF SURGERY - LIKE WATER, SODA.  NOTHING TO DRINK AFTER 11:30 AM DAY OF SURGERY.  PLEASE TAKE THE FOLLOWING MEDICATIONS THE AM OF YOUR SURGERY WITH A FEW SIPS OF WATER:  ALPRAZOLAM, CYMBALTA, ROSUVASTATIN  DO NOT BRING VALUABLES, MONEY, CREDIT CARDS.  DO NOT WEAR JEWELRY, MAKE-UP, NAIL POLISH AND NO METAL PINS OR CLIPS IN YOUR HAIR. CONTACT LENS, DENTURES / PARTIALS, GLASSES SHOULD NOT BE WORN TO SURGERY AND IN MOST CASES-HEARING AIDS WILL NEED TO BE REMOVED.  BRING YOUR GLASSES CASE, ANY EQUIPMENT NEEDED FOR YOUR CONTACT LENS. FOR PATIENTS ADMITTED TO THE HOSPITAL--CHECK OUT TIME THE DAY OF DISCHARGE IS 11:00 AM.  ALL INPATIENT ROOMS ARE PRIVATE - WITH BATHROOM, TELEPHONE, TELEVISION AND WIFI INTERNET.  IF YOU ARE BEING DISCHARGED THE SAME DAY OF YOUR SURGERY--YOU CAN NOT DRIVE YOURSELF HOME--AND SHOULD NOT GO HOME ALONE BY TAXI OR BUS.  NO DRIVING OR OPERATING MACHINERY, OR MAKING LEGAL DECISIONS FOR 24 HOURS FOLLOWING ANESTHESIA / PAIN MEDICATIONS.  PLEASE MAKE ARRANGEMENTS FOR SOMEONE TO BE WITH YOU AT HOME THE FIRST 24 HOURS AFTER SURGERY. RESPONSIBLE DRIVER'S NAME / PHONE                                           PATIENT IS MAKING ARRANGEMENTS                                                    PLEASE BE AWARE THAT YOU MAY NEED ADDITIONAL BLOOD DRAWN DAY OF YOUR SURGERY  _______________________________________________________________________   [facility] - Preparing for Surgery Before surgery, you can play an important role.  Because skin is not sterile, your skin needs to  be as free of germs as possible.  You can reduce the number of germs on your skin by washing with CHG (chlorahexidine gluconate) soap before surgery.  CHG is an antiseptic cleaner which kills germs and bonds with the skin to continue killing germs even after washing. Please DO NOT use if you have an allergy to CHG or antibacterial soaps.  If your skin becomes reddened/irritated stop using the CHG and inform your nurse when you arrive at Short Stay. Do not shave (including legs and underarms) for at least 48 hours prior to the first CHG shower.  You may shave your face/neck. Please follow these instructions carefully:  1.  Shower with CHG Soap the night before surgery and the  morning of Surgery.  2.  If you choose to wash your hair, wash your hair first as usual with your  normal  shampoo.  3.  After you shampoo, rinse your hair and body thoroughly to remove the  shampoo.  4.  Use CHG as you would any other liquid soap.  You can apply chg directly  to the skin and wash                       Gently with a scrungie or clean washcloth.  5.  Apply the CHG Soap to your body ONLY FROM THE NECK DOWN.   Do not use on face/ open                           Wound or open sores. Avoid contact with eyes, ears mouth and genitals (private parts).                       Wash face,  Genitals (private parts) with your normal soap.             6.  Wash thoroughly, paying special attention to the area where your surgery  will be performed.  7.  Thoroughly rinse your body with warm water from the neck down.  8.  DO NOT shower/wash with your normal soap after using and rinsing off  the CHG Soap.                9.  Pat yourself dry with a clean towel.            10.  Wear clean pajamas.            11.  Place clean sheets on your bed the night of your first shower and do not  sleep with pets. Day of Surgery : Do not apply any lotions/deodorants the morning of surgery.  Please wear clean clothes to  the hospital/surgery center.  FAILURE TO FOLLOW THESE INSTRUCTIONS MAY RESULT IN THE CANCELLATION OF YOUR SURGERY PATIENT SIGNATURE_________________________________  NURSE SIGNATURE__________________________________  ________________________________________________________________________   Adam Phenix  An incentive spirometer is a tool that can help keep your lungs clear and active. This tool measures how well you are filling your lungs with each breath. Taking long deep breaths may help reverse or decrease the chance of developing breathing (pulmonary) problems (especially infection) following:  A long period of time when you are unable to move or be active. BEFORE THE PROCEDURE   If the spirometer includes an indicator to show your best effort, your nurse or respiratory therapist will set it to a desired goal.  If possible, sit up straight or lean slightly forward. Try not to slouch.  Hold the incentive spirometer in an upright position. INSTRUCTIONS FOR USE  1. Sit on the edge of your bed if possible, or sit up as far as you can in bed or on a chair. 2. Hold the incentive spirometer in an upright position. 3. Breathe out normally. 4. Place the mouthpiece in your mouth and seal your lips tightly around it. 5. Breathe in slowly and as deeply as possible, raising the piston or the ball toward the top of the column. 6. Hold your breath for 3-5 seconds or for as long as possible. Allow the piston or ball to fall to the bottom of the column. 7. Remove the mouthpiece from your mouth and breathe out normally. 8. Rest for a few seconds and repeat Steps 1 through 7 at least 10 times every 1-2 hours when you are awake. Take your time and take a few normal breaths between deep breaths. 9. The spirometer may include an indicator to show  your best effort. Use the indicator as a goal to work toward during each repetition. 10. After each set of 10 deep breaths, practice coughing to be  sure your lungs are clear. If you have an incision (the cut made at the time of surgery), support your incision when coughing by placing a pillow or rolled up towels firmly against it. Once you are able to get out of bed, walk around indoors and cough well. You may stop using the incentive spirometer when instructed by your caregiver.  RISKS AND COMPLICATIONS  Take your time so you do not get dizzy or light-headed.  If you are in pain, you may need to take or ask for pain medication before doing incentive spirometry. It is harder to take a deep breath if you are having pain. AFTER USE  Rest and breathe slowly and easily.  It can be helpful to keep track of a log of your progress. Your caregiver can provide you with a simple table to help with this. If you are using the spirometer at home, follow these instructions: [place] IF:   You are having difficultly using the spirometer.  You have trouble using the spirometer as often as instructed.  Your pain medication is not giving enough relief while using the spirometer.  You develop fever of 100.5 F (38.1 C) or higher. SEEK IMMEDIATE MEDICAL CARE IF:   You cough up bloody sputum that had not been present before.  You develop fever of 102 F (38.9 C) or greater.  You develop worsening pain at or near the incision site. MAKE SURE YOU:   Understand these instructions.  Will watch your condition.  Will get help right away if you are not doing well or get worse. Document Released: 09/10/2006 Document Revised: 07/23/2011 Document Reviewed: 11/11/2006 [employer] Patient Information 2014 [employer].   ________________________________________________________________________  PT NOTIFIED HER SURGERY TIME WAS MOVED TO 12:30 PM AND SHE IS TO ARRIVE TO SHORT STAY BY 10:30 AM AND NO FOOD AFTER MIDNIGHT, CLEAR LIQUIDS FROM MIDNIGHT UNTIL 6:30 AM.

## 2014-01-19 NOTE — Pre-Procedure Instructions (Signed)
EKG AND CXR REPORTS ARE IN EPIC FROM 05-26-13.

## 2014-01-20 ENCOUNTER — Encounter: Payer: Self-pay | Admitting: Podiatry

## 2014-01-20 ENCOUNTER — Ambulatory Visit (INDEPENDENT_AMBULATORY_CARE_PROVIDER_SITE_OTHER): Payer: Medicare Other | Admitting: Podiatry

## 2014-01-20 DIAGNOSIS — M722 Plantar fascial fibromatosis: Secondary | ICD-10-CM

## 2014-01-21 NOTE — Progress Notes (Signed)
Subjective:     Patient ID: Carrie Keller, female   DOB: October 16, 1945, 68 y.o.   MRN: [id number]  HPI patient presents for shockwave #1 left heel and is due to have surgery on her right knee Wednesday of next week   Review of Systems     Objective:   Physical Exam Neurovascular status intact with intense discomfort left plantar fashion    Assessment:     Plan her fasciitis acute nature left    Plan:     Shockwave administered 2500 shocks 3.6 on intensity 16 frequency and tolerated well

## 2014-01-22 ENCOUNTER — Ambulatory Visit: Payer: Medicare Other

## 2014-01-26 DIAGNOSIS — M25869 Other specified joint disorders, unspecified knee: Secondary | ICD-10-CM

## 2014-01-26 DIAGNOSIS — Z96659 Presence of unspecified artificial knee joint: Secondary | ICD-10-CM

## 2014-01-26 NOTE — H&P (Signed)
[name] is a 68 y.o. female who presents with right knee pain.  HPI- . Knee Pain: Patient presents with knee pain involving the  right knee. Onset of the symptoms was several months ago. Inciting event: none known. Current symptoms include crepitus sensation and popping sensation. Pain is aggravated by going up and down stairs and rising after sitting.  Patient has had prior knee problems. 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 She presents now for right knee arthroscopy and synovectomy  Past Medical History  Diagnosis Date  . Complication of anesthesia   . Heart murmur 1970    MVP/ asympotmatic  . Depression   . Anxiety     sees Dr Jerl Santos  psychiatrist every 3 month  . DVT (deep venous thrombosis) 2008    RIGHT /POST OP  . Pulmonary embolism 2008  . H/O hiatal hernia   . GERD (gastroesophageal reflux disease)   . Arthritis   . Peripheral vascular disease     venous insufficiency/ bilateral legs  . 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 Hyperlipidemia   . PONV (postoperative nausea and vomiting)     NO PROBLEMS WITH N&V LAST COUPLE OF SURGERIES  . Shortness of breath     WITH EXERTION  . Seizures     last seizure 1967- states reaction to COMPAZINE  . Plantar fasciitis     LEFT  . Neuropathy     BOTH FEET AND LEGS-WORSE ON LEFT SIDE  . Restless leg syndrome   . Scoliosis   . Pain     LOWER BACK WITH PROLONGED BENDING OVER AND STANDING- S/P SPINAL FUSION JAN 2015.    Past Surgical History  Procedure Laterality Date  . Joint replacement  2003/2008    bilateral knees  . Tubal ligation    . Knee arthroscopy      right knee  . Tummy tuck    . External ear surgery      "release of nerve behind right ear for bells palsy"  . Nissen fundoplication  16/10  . Total knee revision Right 07/02/2012    Procedure: TOTAL KNEE REVISION;  Surgeon: Gearlean Alf, MD;  Location: [facility];  Service: Orthopedics;  Laterality: Right;  . Colonoscopy    . Maximum access (mas)posterior lumbar interbody fusion (plif) 2 level N/A 06/03/2013    Procedure: FOR MAXIMUM ACCESS (MAS) POSTERIOR LUMBAR INTERBODY FUSION (PLIF) 2 LEVEL;  Surgeon: Eustace Moore, MD;  Location: [facility];  Service: Neurosurgery;  Laterality: N/A;  FOR MAXIMUM ACCESS (MAS) POSTERIOR LUMBAR INTERBODY FUSION (PLIF) 2 LEVEL  . Abdominal hysterectomy      VAGINAL HYSTERECTOMY WITH BSO    Prior to Admission medications   Medication Sig Start Date End Date Taking? Authorizing Provider  ALPRAZolam Duanne Moron) 0.25 MG tablet Take 0.25 mg by mouth daily as needed for anxiety.  12/22/13   Historical Provider, MD  amitriptyline (ELAVIL) 25 MG tablet Take 25 mg by mouth at bedtime.  09/02/13   Historical Provider, MD  Calcium Carbonate-Vitamin D (CALCIUM-VITAMIN D) 500-200 MG-UNIT per tablet Take 2 tablets by mouth every morning.     Historical Provider, MD  celecoxib (CELEBREX) 200 MG capsule Take 200 mg by mouth every morning.  12/23/13   Historical Provider, MD  DULoxetine (CYMBALTA) 30 MG capsule Take 30 mg by mouth every morning.  Historical Provider, MD  DULoxetine (CYMBALTA) 60 MG capsule Take 60 mg by mouth at bedtime.     Historical Provider, MD  gabapentin (NEURONTIN) 300 MG capsule Take 600 mg by mouth at bedtime.    Historical Provider, MD  methocarbamol (ROBAXIN) 500 MG tablet Take 1 tablet (500 mg total) by mouth every 6 (six) hours as needed for muscle spasms. 06/04/13   Eustace Moore, MD  Multiple Vitamins-Minerals (MULTIVITAMIN WITH MINERALS) tablet Take 1 tablet by mouth every morning.     Historical Provider, MD  PARoxetine Mesylate (BRISDELLE) 7.5 MG CAPS Take 7.5 mg by mouth at bedtime.    Historical Provider, MD  polyethylene glycol (MIRALAX / GLYCOLAX) packet Take 17 g by mouth daily as needed for mild constipation.     Historical Provider, MD  pramipexole (MIRAPEX) 1 MG  tablet Take 2 mg by mouth at bedtime.     Historical Provider, MD  rosuvastatin (CRESTOR) 10 MG tablet Take 10 mg by mouth every morning.    Historical Provider, MD  traZODone (DESYREL) 50 MG tablet Take 50 mg by mouth at bedtime.    Historical Provider, MD   KNEE EXAM antalgic gait, no effusion, collateral ligaments intact, crepitus on range of motion with range being 0-125  Physical Examination: General appearance - alert, well appearing, and in no distress Mental status - alert, oriented to person, place, and time Chest - clear to auscultation, no wheezes, rales or rhonchi, symmetric air entry Heart - normal rate, regular rhythm, normal S1, S2, no murmurs, rubs, clicks or gallops Abdomen - soft, nontender, nondistended, no masses or organomegaly Neurological - alert, oriented, normal speech, no focal findings or movement disorder noted   Asessment/Plan--- Right knee patellar clunk syndrome- - Plan right knee arthroscopy with synovetomy. Procedure risks and potential comps discussed with patient who elects to proceed. Goals are decreased pain and increased function with a high likelihood of achieving both

## 2014-01-27 ENCOUNTER — Encounter (HOSPITAL_COMMUNITY): Payer: Medicare Other | Admitting: Anesthesiology

## 2014-01-27 ENCOUNTER — Encounter (HOSPITAL_COMMUNITY): Payer: Self-pay | Admitting: *Deleted

## 2014-01-27 ENCOUNTER — Ambulatory Visit (HOSPITAL_COMMUNITY)
Admission: RE | Admit: 2014-01-27 | Discharge: 2014-01-27 | Disposition: A | Discharge status: Home / Self Care | Payer: Medicare Other | Source: Ambulatory Visit | Attending: Orthopedic Surgery | Admitting: Orthopedic Surgery

## 2014-01-27 ENCOUNTER — Ambulatory Visit (HOSPITAL_COMMUNITY): Payer: Medicare Other | Admitting: Anesthesiology

## 2014-01-27 ENCOUNTER — Encounter (HOSPITAL_COMMUNITY)
Admission: RE | Disposition: A | Discharge status: Home / Self Care | Payer: Self-pay | Source: Ambulatory Visit | Attending: Orthopedic Surgery

## 2014-01-27 DIAGNOSIS — F3289 Other specified depressive episodes: Secondary | ICD-10-CM | POA: Insufficient documentation

## 2014-01-27 DIAGNOSIS — R29898 Other symptoms and signs involving the musculoskeletal system: Secondary | ICD-10-CM | POA: Insufficient documentation

## 2014-01-27 DIAGNOSIS — Z96659 Presence of unspecified artificial knee joint: Secondary | ICD-10-CM

## 2014-01-27 DIAGNOSIS — M129 Arthropathy, unspecified: Secondary | ICD-10-CM | POA: Diagnosis not present

## 2014-01-27 DIAGNOSIS — Z79899 Other long term (current) drug therapy: Secondary | ICD-10-CM | POA: Diagnosis not present

## 2014-01-27 DIAGNOSIS — M25869 Other specified joint disorders, unspecified knee: Secondary | ICD-10-CM

## 2014-01-27 DIAGNOSIS — IMO0001 Reserved for inherently not codable concepts without codable children: Secondary | ICD-10-CM

## 2014-01-27 DIAGNOSIS — T8482XD Fibrosis due to internal orthopedic prosthetic devices, implants and grafts, subsequent encounter: Secondary | ICD-10-CM

## 2014-01-27 DIAGNOSIS — I739 Peripheral vascular disease, unspecified: Secondary | ICD-10-CM | POA: Diagnosis not present

## 2014-01-27 DIAGNOSIS — K219 Gastro-esophageal reflux disease without esophagitis: Secondary | ICD-10-CM | POA: Diagnosis not present

## 2014-01-27 DIAGNOSIS — E785 Hyperlipidemia, unspecified: Secondary | ICD-10-CM | POA: Insufficient documentation

## 2014-01-27 DIAGNOSIS — F411 Generalized anxiety disorder: Secondary | ICD-10-CM | POA: Insufficient documentation

## 2014-01-27 DIAGNOSIS — F329 Major depressive disorder, single episode, unspecified: Secondary | ICD-10-CM | POA: Insufficient documentation

## 2014-01-27 HISTORY — PX: KNEE ARTHROSCOPY: SHX127

## 2014-01-27 SURGERY — ARTHROSCOPY, KNEE
Anesthesia: General | Site: Knee | Laterality: Right

## 2014-01-27 MED ORDER — SODIUM CHLORIDE 0.9 % IV SOLN: INTRAVENOUS | Status: DC

## 2014-01-27 MED ORDER — CEFAZOLIN SODIUM-DEXTROSE 2-3 GM-% IV SOLR
2.0000 g | INTRAVENOUS | Status: AC
  Administered 2014-01-27: 2 g via INTRAVENOUS

## 2014-01-27 MED ORDER — HYDROCODONE-ACETAMINOPHEN 5-325 MG PO TABS
1.0000 | ORAL_TABLET | ORAL | Status: DC | PRN
  Administered 2014-01-27: 1 via ORAL
  Filled 2014-01-27: qty 1

## 2014-01-27 MED ORDER — PROPOFOL 10 MG/ML IV BOLUS
INTRAVENOUS | Status: AC
  Filled 2014-01-27: qty 20

## 2014-01-27 MED ORDER — LACTATED RINGERS IR SOLN
Status: DC | PRN
  Administered 2014-01-27: 6000 mL

## 2014-01-27 MED ORDER — LACTATED RINGERS IV SOLN
INTRAVENOUS | Status: DC
  Administered 2014-01-27: 1000 mL via INTRAVENOUS

## 2014-01-27 MED ORDER — HYDROCODONE-ACETAMINOPHEN 5-325 MG PO TABS: 1.0000 | ORAL_TABLET | ORAL | Status: DC | PRN

## 2014-01-27 MED ORDER — 0.9 % SODIUM CHLORIDE (POUR BTL) OPTIME
TOPICAL | Status: DC | PRN
  Administered 2014-01-27: 1000 mL

## 2014-01-27 MED ORDER — CHLORHEXIDINE GLUCONATE 4 % EX LIQD: 60.0000 mL | Freq: Once | CUTANEOUS | Status: DC

## 2014-01-27 MED ORDER — BUPIVACAINE-EPINEPHRINE 0.25% -1:200000 IJ SOLN
INTRAMUSCULAR | Status: DC | PRN
  Administered 2014-01-27: 20 mL

## 2014-01-27 MED ORDER — SODIUM CHLORIDE 0.9 % IJ SOLN
INTRAMUSCULAR | Status: AC
  Filled 2014-01-27: qty 10

## 2014-01-27 MED ORDER — LIDOCAINE HCL (CARDIAC) 20 MG/ML IV SOLN
INTRAVENOUS | Status: AC
  Filled 2014-01-27: qty 5

## 2014-01-27 MED ORDER — ONDANSETRON HCL 4 MG/2ML IJ SOLN
INTRAMUSCULAR | Status: DC | PRN
Start: 2014-01-27 — End: 2014-01-27
  Administered 2014-01-27: 4 mg via INTRAVENOUS

## 2014-01-27 MED ORDER — LIDOCAINE HCL (CARDIAC) 20 MG/ML IV SOLN
INTRAVENOUS | Status: DC | PRN
  Administered 2014-01-27: 100 mg via INTRAVENOUS

## 2014-01-27 MED ORDER — EPHEDRINE SULFATE 50 MG/ML IJ SOLN
INTRAMUSCULAR | Status: AC
  Filled 2014-01-27: qty 1

## 2014-01-27 MED ORDER — DEXAMETHASONE SODIUM PHOSPHATE 10 MG/ML IJ SOLN
10.0000 mg | Freq: Once | INTRAMUSCULAR | Status: AC
  Administered 2014-01-27: 10 mg via INTRAVENOUS

## 2014-01-27 MED ORDER — ACETAMINOPHEN 10 MG/ML IV SOLN
1000.0000 mg | Freq: Once | INTRAVENOUS | Status: AC
  Administered 2014-01-27: 1000 mg via INTRAVENOUS
  Filled 2014-01-27: qty 100

## 2014-01-27 MED ORDER — PROPOFOL 10 MG/ML IV BOLUS
INTRAVENOUS | Status: DC | PRN
  Administered 2014-01-27: 170 mg via INTRAVENOUS

## 2014-01-27 MED ORDER — FENTANYL CITRATE 0.05 MG/ML IJ SOLN: 25.0000 ug | INTRAMUSCULAR | Status: DC | PRN

## 2014-01-27 MED ORDER — ONDANSETRON HCL 4 MG/2ML IJ SOLN
INTRAMUSCULAR | Status: AC
  Filled 2014-01-27: qty 2

## 2014-01-27 MED ORDER — RIVAROXABAN 10 MG PO TABS: 10.0000 mg | ORAL_TABLET | Freq: Every day | ORAL | Status: DC

## 2014-01-27 MED ORDER — CEFAZOLIN SODIUM-DEXTROSE 2-3 GM-% IV SOLR
INTRAVENOUS | Status: AC
  Filled 2014-01-27: qty 50

## 2014-01-27 MED ORDER — FENTANYL CITRATE 0.05 MG/ML IJ SOLN
INTRAMUSCULAR | Status: AC
  Filled 2014-01-27: qty 2

## 2014-01-27 MED ORDER — KETOROLAC TROMETHAMINE 15 MG/ML IJ SOLN
INTRAMUSCULAR | Status: DC | PRN
  Administered 2014-01-27: 15 mg via INTRAVENOUS

## 2014-01-27 MED ORDER — KETOROLAC TROMETHAMINE 30 MG/ML IJ SOLN
INTRAMUSCULAR | Status: AC
  Filled 2014-01-27: qty 1

## 2014-01-27 MED ORDER — FENTANYL CITRATE 0.05 MG/ML IJ SOLN
INTRAMUSCULAR | Status: DC | PRN
  Administered 2014-01-27 (×2): 50 ug via INTRAVENOUS

## 2014-01-27 MED ORDER — BUPIVACAINE-EPINEPHRINE (PF) 0.25% -1:200000 IJ SOLN
INTRAMUSCULAR | Status: AC
  Filled 2014-01-27: qty 30

## 2014-01-27 MED ORDER — DEXAMETHASONE SODIUM PHOSPHATE 10 MG/ML IJ SOLN
INTRAMUSCULAR | Status: AC
  Filled 2014-01-27: qty 1

## 2014-01-27 SURGICAL SUPPLY — 23 items
BLADE 4.2CUDA (BLADE) ×3 IMPLANT
CLOTH BEACON ORANGE TIMEOUT ST (SAFETY) ×3 IMPLANT
COUNTER NEEDLE 20 DBL MAG RED (NEEDLE) ×3 IMPLANT
CUFF TOURN SGL QUICK 34 (TOURNIQUET CUFF) ×2
CUFF TRNQT CYL 34X4X40X1 (TOURNIQUET CUFF) ×1 IMPLANT
DRAPE U-SHAPE 47X51 STRL (DRAPES) ×3 IMPLANT
DRSG EMULSION OIL 3X3 NADH (GAUZE/BANDAGES/DRESSINGS) ×3 IMPLANT
DURAPREP 26ML APPLICATOR (WOUND CARE) ×3 IMPLANT
GLOVE BIO SURGEON STRL SZ8 (GLOVE) ×3 IMPLANT
GLOVE BIOGEL PI IND STRL 8 (GLOVE) ×1 IMPLANT
GLOVE BIOGEL PI INDICATOR 8 (GLOVE) ×2
GOWN STRL REUS W/TWL LRG LVL3 (GOWN DISPOSABLE) ×3 IMPLANT
MANIFOLD NEPTUNE II (INSTRUMENTS) ×3 IMPLANT
PACK ARTHROSCOPY WL (CUSTOM PROCEDURE TRAY) ×3 IMPLANT
PACK ICE MAXI GEL EZY WRAP (MISCELLANEOUS) ×9 IMPLANT
PADDING CAST COTTON 6X4 STRL (CAST SUPPLIES) ×3 IMPLANT
POSITIONER SURGICAL ARM (MISCELLANEOUS) ×3 IMPLANT
SET ARTHROSCOPY TUBING (MISCELLANEOUS) ×2
SET ARTHROSCOPY TUBING LN (MISCELLANEOUS) ×1 IMPLANT
SUT ETHILON 4 0 PS 2 18 (SUTURE) ×3 IMPLANT
TOWEL OR 17X26 10 PK STRL BLUE (TOWEL DISPOSABLE) ×3 IMPLANT
WAND 90 DEG TURBOVAC W/CORD (SURGICAL WAND) IMPLANT
WRAP KNEE MAXI GEL POST OP (GAUZE/BANDAGES/DRESSINGS) ×3 IMPLANT

## 2014-01-27 NOTE — Progress Notes (Signed)
Pt up ambulated with stand by assist.   She used weight bearing as tolerated on her right knee.  Pt tolerated well.

## 2014-01-27 NOTE — Transfer of Care (Signed)
Immediate Anesthesia Transfer of Care Note  Patient: Carrie Keller  Procedure(s) Performed: Procedure(s): ARTHROSCOPY RIGHT KNEE WITH SYNOVECTOMY (Right)  Patient Location: PACU  Anesthesia Type:General  Level of Consciousness: awake, alert  and oriented  Airway & Oxygen Therapy: Patient Spontanous Breathing and Patient connected to face mask oxygen  Post-op Assessment: Report given to PACU RN and Post -op Vital signs reviewed and stable  Post vital signs: Reviewed and stable  Complications: No apparent anesthesia complications

## 2014-01-27 NOTE — Op Note (Signed)
NAMEKASSITY, WOODSON NO.:  [id number]  MEDICAL RECORD NO.:  [id number]  LOCATION:  [facility]                         FACILITY:  [facility]  PHYSICIAN:  Gaynelle Arabian, M.D.    DATE OF BIRTH:  1945/11/26  DATE OF PROCEDURE:  01/27/2014 DATE OF DISCHARGE:  01/27/2014                              OPERATIVE REPORT   PREOPERATIVE DIAGNOSIS:  Patellar clunk syndrome, right knee.  POSTOPERATIVE DIAGNOSIS:  Patellar clunk syndrome, right knee.  PROCEDURE:  Right knee arthroscopy with synovectomy.  SURGEON:  Gaynelle Arabian, M.D.  ANESTHESIA:  General.  ESTIMATED BLOOD LOSS:  Minimal.  DRAINS:  None.  COMPLICATIONS:  None.  CONDITION:  Stable to recovery.  BRIEF CLINICAL NOTE:  Ms. Wrobel is a 68 year old female, who underwent a right total knee arthroplasty revision approximately year and half ago. She was doing very well and then in the past few months, started to develop painful popping in the knee consistent with patellar clunk syndrome.  She presents now for arthroscopy and synovectomy.  PROCEDURE IN DETAIL:  After successful administration of general anesthetic, tourniquet was placed high on the right thigh and right lower extremity, prepped and draped in usual sterile fashion.  Standard superomedial and inferolateral incisions were made.  Inflow cannula passed superomedial, camera passed inferolateral.  Arthroscopic visualization proceeds.  Undersurface of the patella shows the patellar components in good position, but there was a large amount of hypertrophic synovial tissue at the junction of the quad tendon in the patellar component, which was obliterating the suprapatellar pouch.  A superolateral portal was then created with an 11 blade and then using combination of a shaver and ArthroCare device, this tissue was debrided back to normal-appearing tissue and cauterized.  This effectively recreates the suprapatellar pouch.  Medial and lateral gutters  were visualized, and any abnormal tissue in those areas was also removed. Joints again inspected and the gutters and the suprapatellar pouch were restored with no abnormal tissue.  Arthroscopic equipments were removed from the lateral portals, which were closed with interrupted 4-0 nylon.  20 mL of 0.25% Marcaine with epinephrine was injected through the inflow cannula and that was removed and that portal was closed with nylon.  Incision was cleaned and dried and a bulky sterile dressing applied.  She was then awakened and transported to recovery in stable condition.     Gaynelle Arabian, M.D.     FA/MEDQ  D:  01/27/2014  T:  01/27/2014  Job:  [id number]

## 2014-01-27 NOTE — Brief Op Note (Signed)
01/27/2014  1:03 PM  PATIENT:  Carrie Keller  68 y.o. female  PRE-OPERATIVE DIAGNOSIS:  patella clunk syndrome of right knee  POST-OPERATIVE DIAGNOSIS:  patella clunk syndrome of right knee  PROCEDURE:  Procedure(s): ARTHROSCOPY RIGHT KNEE WITH SYNOVECTOMY (Right)  SURGEON:  Surgeon(s) and Role:    * Gearlean Alf, MD - Primary  PHYSICIAN ASSISTANT:   ASSISTANTS: none   ANESTHESIA:   general  EBL:     LOCAL MEDICATIONS USED:  MARCAINE    COUNTS:  YES  TOURNIQUET:    DICTATION: .Other Dictation: Dictation Number [id number]  PLAN OF CARE: Discharge to home after PACU  PATIENT DISPOSITION:  PACU - hemodynamically stable.

## 2014-01-27 NOTE — Anesthesia Preprocedure Evaluation (Signed)
Anesthesia Evaluation  Patient identified by MRN, date of birth, ID band Patient awake    Reviewed: Allergy & Precautions, H&P , NPO status , Patient's Chart, lab work & pertinent test results  History of Anesthesia Complications (+) PONV and history of anesthetic complications  Airway Mallampati: II TM Distance: >3 FB Neck ROM: Full    Dental no notable dental hx.    Pulmonary neg pulmonary ROS, shortness of breath, former smoker,  breath sounds clear to auscultation  Pulmonary exam normal       Cardiovascular + Peripheral Vascular Disease Rhythm:Regular Rate:Normal     Neuro/Psych Seizures -,  PSYCHIATRIC DISORDERS Anxiety Depression  Neuromuscular disease negative neurological ROS  negative psych ROS   GI/Hepatic Neg liver ROS, hiatal hernia, GERD-  ,  Endo/Other  negative endocrine ROS  Renal/GU negative Renal ROS  negative genitourinary   Musculoskeletal  (+) Arthritis -,   Abdominal (+) + obese,   Peds negative pediatric ROS (+)  Hematology  (+) anemia ,   Anesthesia Other Findings   Reproductive/Obstetrics negative OB ROS                           Anesthesia Physical Anesthesia Plan  ASA: II  Anesthesia Plan: General   Post-op Pain Management:    Induction: Intravenous  Airway Management Planned: LMA  Additional Equipment:   Intra-op Plan:   Post-operative Plan: Extubation in OR  Informed Consent: I have reviewed the patients History and Physical, chart, labs and discussed the procedure including the risks, benefits and alternatives for the proposed anesthesia with the patient or authorized representative who has indicated his/her understanding and acceptance.   Dental advisory given  Plan Discussed with: CRNA  Anesthesia Plan Comments:         Anesthesia Quick Evaluation

## 2014-01-27 NOTE — Anesthesia Postprocedure Evaluation (Signed)
  Anesthesia Post-op Note  Patient: Carrie Keller  Procedure(s) Performed: Procedure(s) (LRB): ARTHROSCOPY RIGHT KNEE WITH SYNOVECTOMY (Right)  Patient Location: PACU  Anesthesia Type: General  Level of Consciousness: awake and alert   Airway and Oxygen Therapy: Patient Spontanous Breathing  Post-op Pain: mild  Post-op Assessment: Post-op Vital signs reviewed, Patient's Cardiovascular Status Stable, Respiratory Function Stable, Patent Airway and No signs of Nausea or vomiting  Last Vitals:  Filed Vitals:   01/27/14 1405  BP: 121/73  Pulse: 95  Temp: 36.8 C  Resp: 16    Post-op Vital Signs: stable   Complications: No apparent anesthesia complications

## 2014-01-27 NOTE — Discharge Instructions (Signed)

## 2014-01-27 NOTE — Interval H&P Note (Signed)
History and Physical Interval Note:  01/27/2014 12:08 PM  Carrie Keller  has presented today for surgery, with the diagnosis of patella clunk syndrome of right knee  The various methods of treatment have been discussed with the patient and family. After consideration of risks, benefits and other options for treatment, the patient has consented to  Procedure(s): ARTHROSCOPY RIGHT KNEE WITH SYNOVECTOMY (Right) as a surgical intervention .  The patient's history has been reviewed, patient examined, no change in status, stable for surgery.  I have reviewed the patient's chart and labs.  Questions were answered to the patient's satisfaction.     Gearlean Alf

## 2014-01-28 ENCOUNTER — Encounter (HOSPITAL_COMMUNITY): Payer: Self-pay | Admitting: Orthopedic Surgery

## 2014-02-01 ENCOUNTER — Encounter: Payer: Self-pay | Admitting: Podiatry

## 2014-02-01 ENCOUNTER — Ambulatory Visit: Payer: Medicare Other | Admitting: Podiatry

## 2014-02-01 ENCOUNTER — Ambulatory Visit (INDEPENDENT_AMBULATORY_CARE_PROVIDER_SITE_OTHER): Payer: Medicare Other | Admitting: Podiatry

## 2014-02-01 VITALS — BP 128/78 | HR 110 | Resp 18

## 2014-02-01 DIAGNOSIS — M722 Plantar fascial fibromatosis: Secondary | ICD-10-CM

## 2014-02-01 NOTE — Progress Notes (Signed)
Subjective:     Patient ID: Carrie Keller, female   DOB: 1945/10/25, 68 y.o.   MRN: [id number]  HPI patient presents with pain in the left plantar heel but she states it's slightly better after shockwave   Review of Systems     Objective:   Physical Exam Neurovascular status intact with pain in the plantar heel left at the insertion of the tendon into the calcaneus    Assessment:     Plantar fasciitis still noted left    Plan:     Shockwave #2 accomplished 2500 shocks 4.2 intensity 16 frequency

## 2014-02-10 ENCOUNTER — Ambulatory Visit (INDEPENDENT_AMBULATORY_CARE_PROVIDER_SITE_OTHER): Payer: Medicare Other | Admitting: Podiatry

## 2014-02-10 ENCOUNTER — Encounter: Payer: Self-pay | Admitting: Podiatry

## 2014-02-10 DIAGNOSIS — M722 Plantar fascial fibromatosis: Secondary | ICD-10-CM

## 2014-02-10 NOTE — Progress Notes (Signed)
Subjective:     Patient ID: Carrie Keller, female   DOB: 09/14/1945, 68 y.o.   MRN: [id number]  HPI patient states that my heel is feeling better at this time   Review of Systems     Objective:   Physical Exam Neurovascular status intact with diminishment of discomfort plantar heel at the insertion of the tendon into the calcaneus    Assessment:     Improving plantar fasciitis with shockwave    Plan:     2500 shocks 4.0 intensity 16 frequency administered and reappoint one month

## 2014-03-10 ENCOUNTER — Encounter: Payer: Self-pay | Admitting: Podiatry

## 2014-03-10 ENCOUNTER — Ambulatory Visit (INDEPENDENT_AMBULATORY_CARE_PROVIDER_SITE_OTHER): Payer: Medicare Other | Admitting: Podiatry

## 2014-03-10 DIAGNOSIS — M722 Plantar fascial fibromatosis: Secondary | ICD-10-CM

## 2014-03-10 NOTE — Progress Notes (Signed)
Subjective:     Patient ID: Carrie Keller, female   DOB: May 05, 1946, 68 y.o.   MRN: [id number]  HPI patient states he was doing real well but hurting some now but no where near as bad as previously   Review of Systems     Objective:   Physical Exam Neurovascular status intact with mild to moderate discomfort plantar aspect left heel    Assessment:     Plantar fasciitis left heel which does seem to be improving but still present    Plan:

## 2014-06-01 DIAGNOSIS — Z4789 Encounter for other orthopedic aftercare: Secondary | ICD-10-CM | POA: Diagnosis not present

## 2014-06-03 DIAGNOSIS — N951 Menopausal and female climacteric states: Secondary | ICD-10-CM | POA: Diagnosis not present

## 2014-06-03 DIAGNOSIS — K219 Gastro-esophageal reflux disease without esophagitis: Secondary | ICD-10-CM | POA: Diagnosis not present

## 2014-06-03 DIAGNOSIS — E669 Obesity, unspecified: Secondary | ICD-10-CM | POA: Diagnosis not present

## 2014-06-03 DIAGNOSIS — M7711 Lateral epicondylitis, right elbow: Secondary | ICD-10-CM | POA: Diagnosis not present

## 2014-06-03 DIAGNOSIS — Z79899 Other long term (current) drug therapy: Secondary | ICD-10-CM | POA: Diagnosis not present

## 2014-06-03 DIAGNOSIS — M25569 Pain in unspecified knee: Secondary | ICD-10-CM | POA: Diagnosis not present

## 2014-06-03 DIAGNOSIS — F339 Major depressive disorder, recurrent, unspecified: Secondary | ICD-10-CM | POA: Diagnosis not present

## 2014-06-03 DIAGNOSIS — E785 Hyperlipidemia, unspecified: Secondary | ICD-10-CM | POA: Diagnosis not present

## 2014-06-06 ENCOUNTER — Other Ambulatory Visit: Payer: Self-pay | Admitting: Podiatry

## 2014-06-07 DIAGNOSIS — R030 Elevated blood-pressure reading, without diagnosis of hypertension: Secondary | ICD-10-CM | POA: Diagnosis not present

## 2014-06-07 DIAGNOSIS — M545 Low back pain: Secondary | ICD-10-CM | POA: Diagnosis not present

## 2014-06-07 DIAGNOSIS — Z6835 Body mass index (BMI) 35.0-35.9, adult: Secondary | ICD-10-CM | POA: Diagnosis not present

## 2014-06-14 DIAGNOSIS — Z6835 Body mass index (BMI) 35.0-35.9, adult: Secondary | ICD-10-CM | POA: Diagnosis not present

## 2014-06-17 DIAGNOSIS — M5137 Other intervertebral disc degeneration, lumbosacral region: Secondary | ICD-10-CM | POA: Diagnosis not present

## 2014-06-17 DIAGNOSIS — M545 Low back pain: Secondary | ICD-10-CM | POA: Diagnosis not present

## 2014-06-17 DIAGNOSIS — Z981 Arthrodesis status: Secondary | ICD-10-CM | POA: Diagnosis not present

## 2014-06-30 DIAGNOSIS — Z6835 Body mass index (BMI) 35.0-35.9, adult: Secondary | ICD-10-CM | POA: Diagnosis not present

## 2014-07-05 ENCOUNTER — Other Ambulatory Visit: Payer: Self-pay | Admitting: Neurological Surgery

## 2014-07-05 DIAGNOSIS — Z6834 Body mass index (BMI) 34.0-34.9, adult: Secondary | ICD-10-CM | POA: Diagnosis not present

## 2014-07-05 DIAGNOSIS — S32009K Unspecified fracture of unspecified lumbar vertebra, subsequent encounter for fracture with nonunion: Secondary | ICD-10-CM | POA: Diagnosis not present

## 2014-07-08 DIAGNOSIS — N182 Chronic kidney disease, stage 2 (mild): Secondary | ICD-10-CM | POA: Diagnosis not present

## 2014-07-08 DIAGNOSIS — R945 Abnormal results of liver function studies: Secondary | ICD-10-CM | POA: Diagnosis not present

## 2014-07-13 ENCOUNTER — Ambulatory Visit (HOSPITAL_COMMUNITY)
Admission: RE | Admit: 2014-07-13 | Discharge: 2014-07-13 | Disposition: A | Discharge status: Home / Self Care | Payer: Medicare Other | Source: Ambulatory Visit | Attending: Neurological Surgery | Admitting: Neurological Surgery

## 2014-07-13 ENCOUNTER — Encounter (HOSPITAL_COMMUNITY): Payer: Self-pay

## 2014-07-13 ENCOUNTER — Encounter (HOSPITAL_COMMUNITY)
Admission: RE | Admit: 2014-07-13 | Discharge: 2014-07-13 | Disposition: A | Discharge status: Home / Self Care | Payer: Medicare Other | Source: Ambulatory Visit | Attending: Neurological Surgery | Admitting: Neurological Surgery

## 2014-07-13 DIAGNOSIS — X58XXXD Exposure to other specified factors, subsequent encounter: Secondary | ICD-10-CM | POA: Insufficient documentation

## 2014-07-13 DIAGNOSIS — I440 Atrioventricular block, first degree: Secondary | ICD-10-CM | POA: Diagnosis not present

## 2014-07-13 DIAGNOSIS — R05 Cough: Secondary | ICD-10-CM | POA: Diagnosis not present

## 2014-07-13 DIAGNOSIS — Z01818 Encounter for other preprocedural examination: Secondary | ICD-10-CM | POA: Diagnosis not present

## 2014-07-13 DIAGNOSIS — S32009K Unspecified fracture of unspecified lumbar vertebra, subsequent encounter for fracture with nonunion: Secondary | ICD-10-CM | POA: Diagnosis not present

## 2014-07-13 DIAGNOSIS — Z01812 Encounter for preprocedural laboratory examination: Secondary | ICD-10-CM | POA: Diagnosis not present

## 2014-07-13 DIAGNOSIS — Z0183 Encounter for blood typing: Secondary | ICD-10-CM | POA: Insufficient documentation

## 2014-07-13 LAB — CBC WITH DIFFERENTIAL/PLATELET
BASOS ABS: 0 10*3/uL (ref 0.0–0.1)
Basophils Relative: 0 % (ref 0–1)
Eosinophils Absolute: 0.1 10*3/uL (ref 0.0–0.7)
Eosinophils Relative: 2 % (ref 0–5)
HCT: 42.7 % (ref 36.0–46.0)
Hemoglobin: 13.9 g/dL (ref 12.0–15.0)
Lymphocytes Relative: 30 % (ref 12–46)
Lymphs Abs: 2 10*3/uL (ref 0.7–4.0)
MCH: 32 pg (ref 26.0–34.0)
MCHC: 32.6 g/dL (ref 30.0–36.0)
MCV: 98.4 fL (ref 78.0–100.0)
MONO ABS: 0.5 10*3/uL (ref 0.1–1.0)
Monocytes Relative: 7 % (ref 3–12)
NEUTROS ABS: 4.1 10*3/uL (ref 1.7–7.7)
Neutrophils Relative %: 61 % (ref 43–77)
PLATELETS: 250 10*3/uL (ref 150–400)
RBC: 4.34 MIL/uL (ref 3.87–5.11)
RDW: 14.7 % (ref 11.5–15.5)
WBC: 6.7 10*3/uL (ref 4.0–10.5)

## 2014-07-13 LAB — URINALYSIS, ROUTINE W REFLEX MICROSCOPIC
BILIRUBIN URINE: NEGATIVE
Glucose, UA: NEGATIVE mg/dL
Hgb urine dipstick: NEGATIVE
KETONES UR: NEGATIVE mg/dL
Leukocytes, UA: NEGATIVE
NITRITE: NEGATIVE
PROTEIN: NEGATIVE mg/dL
Specific Gravity, Urine: 1.01 (ref 1.005–1.030)
Urobilinogen, UA: 0.2 mg/dL (ref 0.0–1.0)
pH: 8 (ref 5.0–8.0)

## 2014-07-13 LAB — BASIC METABOLIC PANEL
ANION GAP: 10 (ref 5–15)
BUN: 9 mg/dL (ref 6–23)
CALCIUM: 10 mg/dL (ref 8.4–10.5)
CO2: 26 mmol/L (ref 19–32)
CREATININE: 0.68 mg/dL (ref 0.50–1.10)
Chloride: 104 mmol/L (ref 96–112)
GFR, EST NON AFRICAN AMERICAN: 87 mL/min — AB (ref >90)
Glucose, Bld: 96 mg/dL (ref 70–99)
Potassium: 4.3 mmol/L (ref 3.5–5.1)
SODIUM: 140 mmol/L (ref 135–145)

## 2014-07-13 LAB — TYPE AND SCREEN
ABO/RH(D): O POS
Antibody Screen: NEGATIVE

## 2014-07-13 LAB — APTT: aPTT: 36 seconds (ref 24–37)

## 2014-07-13 LAB — SURGICAL PCR SCREEN
MRSA, PCR: NEGATIVE
Staphylococcus aureus: POSITIVE — AB

## 2014-07-13 LAB — PROTIME-INR
INR: 0.96 (ref 0.00–1.49)
PROTHROMBIN TIME: 12.9 s (ref 11.6–15.2)

## 2014-07-13 NOTE — Pre-Procedure Instructions (Signed)
B[place] N Jernberg  07/13/2014   Your procedure is scheduled on:  Wednesday July 21, 2014 at 0830 AM  Report to [facility] Admitting at 0630 AM.  Call this number if you have problems the morning of surgery: [phone]   Remember:   Do not eat food or drink liquids after midnight.   Take these medicines the morning of surgery with A SIP OF WATER: Xanax, and Gabapentin  Stop Aspirin, Multivitamins, Nsaids, and Herbal medications 7 days prior to surgery.    Do not wear jewelry, make-up or nail polish.  Do not wear lotions, powders, or perfumes. You may wear deodorant.  Do not shave 48 hours prior to surgery.   Do not bring valuables to the hospital.  [facility] is not responsible                  for any belongings or valuables.               Contacts, dentures or bridgework may not be worn into surgery.  Leave suitcase in the car. After surgery it may be brought to your room.  For patients admitted to the hospital, discharge time is determined by your                treatment team.               Patients discharged the day of surgery will not be allowed to drive  home.    Special Instructions: [facility] - Preparing for Surgery  Before surgery, you can play an important role.  Because skin is not sterile, your skin needs to be as free of germs as possible.  You can reduce the number of germs on you skin by washing with CHG (chlorahexidine gluconate) soap before surgery.  CHG is an antiseptic cleaner which kills germs and bonds with the skin to continue killing germs even after washing.  Please DO NOT use if you have an allergy to CHG or antibacterial soaps.  If your skin becomes reddened/irritated stop using the CHG and inform your nurse when you arrive at Short Stay.  Do not shave (including legs and underarms) for at least 48 hours prior to the first CHG shower.  You may shave your face.  Please follow these instructions carefully:   1.  Shower with CHG Soap the night  before surgery and the                                morning of Surgery.  2.  If you choose to wash your hair, wash your hair first as usual with your       normal shampoo.  3.  After you shampoo, rinse your hair and body thoroughly to remove the                      Shampoo.  4.  Use CHG as you would any other liquid soap.  You can apply chg directly       to the skin and wash gently with scrungie or a clean washcloth.  5.  Apply the CHG Soap to your body ONLY FROM THE NECK DOWN.        Do not use on open wounds or open sores.  Avoid contact with your eyes,       ears, mouth and genitals (private parts).  Wash genitals (private parts)  with your normal soap.  6.  Wash thoroughly, paying special attention to the area where your surgery        will be performed.  7.  Thoroughly rinse your body with warm water from the neck down.  8.  DO NOT shower/wash with your normal soap after using and rinsing off       the CHG Soap.  9.  Pat yourself dry with a clean towel.            10.  Wear clean pajamas.            11.  Place clean sheets on your bed the night of your first shower and do not        sleep with pets.  Day of Surgery  Do not apply any lotions/deoderants the morning of surgery.  Please wear clean clothes to the hospital/surgery center.      Please read over the following fact sheets that you were given: Pain Booklet, Coughing and Deep Breathing, Blood Transfusion Information, MRSA Information and Surgical Site Infection Prevention

## 2014-07-20 DIAGNOSIS — F3342 Major depressive disorder, recurrent, in full remission: Secondary | ICD-10-CM | POA: Diagnosis not present

## 2014-07-20 MED ORDER — CEFAZOLIN SODIUM-DEXTROSE 2-3 GM-% IV SOLR
2.0000 g | INTRAVENOUS | Status: AC
  Administered 2014-07-21: 2 g via INTRAVENOUS
  Filled 2014-07-20: qty 50

## 2014-07-20 NOTE — Progress Notes (Signed)
Pt notified of time change;to arrive at 0730-verbalized understanding 

## 2014-07-21 ENCOUNTER — Inpatient Hospital Stay (HOSPITAL_COMMUNITY): Payer: Medicare Other | Admitting: Anesthesiology

## 2014-07-21 ENCOUNTER — Inpatient Hospital Stay (HOSPITAL_COMMUNITY)
Admission: RE | Admit: 2014-07-21 | Discharge: 2014-07-23 | DRG: 460 | Disposition: A | Discharge status: Home / Self Care | Payer: Medicare Other | Source: Ambulatory Visit | Attending: Neurological Surgery | Admitting: Neurological Surgery

## 2014-07-21 ENCOUNTER — Encounter (HOSPITAL_COMMUNITY)
Admission: RE | Disposition: A | Discharge status: Home / Self Care | Payer: Self-pay | Source: Ambulatory Visit | Attending: Neurological Surgery

## 2014-07-21 ENCOUNTER — Encounter (HOSPITAL_COMMUNITY): Payer: Self-pay | Admitting: *Deleted

## 2014-07-21 DIAGNOSIS — Z7982 Long term (current) use of aspirin: Secondary | ICD-10-CM

## 2014-07-21 DIAGNOSIS — Z79899 Other long term (current) drug therapy: Secondary | ICD-10-CM | POA: Diagnosis not present

## 2014-07-21 DIAGNOSIS — Z888 Allergy status to other drugs, medicaments and biological substances status: Secondary | ICD-10-CM | POA: Diagnosis not present

## 2014-07-21 DIAGNOSIS — M960 Pseudarthrosis after fusion or arthrodesis: Principal | ICD-10-CM | POA: Diagnosis present

## 2014-07-21 DIAGNOSIS — G629 Polyneuropathy, unspecified: Secondary | ICD-10-CM | POA: Diagnosis present

## 2014-07-21 DIAGNOSIS — Z981 Arthrodesis status: Secondary | ICD-10-CM

## 2014-07-21 DIAGNOSIS — F329 Major depressive disorder, single episode, unspecified: Secondary | ICD-10-CM | POA: Diagnosis present

## 2014-07-21 DIAGNOSIS — Z96653 Presence of artificial knee joint, bilateral: Secondary | ICD-10-CM | POA: Diagnosis present

## 2014-07-21 DIAGNOSIS — G2581 Restless legs syndrome: Secondary | ICD-10-CM | POA: Diagnosis present

## 2014-07-21 DIAGNOSIS — E785 Hyperlipidemia, unspecified: Secondary | ICD-10-CM | POA: Diagnosis present

## 2014-07-21 DIAGNOSIS — M549 Dorsalgia, unspecified: Secondary | ICD-10-CM | POA: Diagnosis not present

## 2014-07-21 DIAGNOSIS — Z90710 Acquired absence of both cervix and uterus: Secondary | ICD-10-CM

## 2014-07-21 DIAGNOSIS — M47816 Spondylosis without myelopathy or radiculopathy, lumbar region: Secondary | ICD-10-CM | POA: Diagnosis not present

## 2014-07-21 DIAGNOSIS — I739 Peripheral vascular disease, unspecified: Secondary | ICD-10-CM | POA: Diagnosis not present

## 2014-07-21 DIAGNOSIS — F419 Anxiety disorder, unspecified: Secondary | ICD-10-CM | POA: Diagnosis present

## 2014-07-21 SURGERY — POSTERIOR LUMBAR FUSION 1 WITH HARDWARE REMOVAL
Anesthesia: General | Site: Back

## 2014-07-21 MED ORDER — FENTANYL CITRATE 0.05 MG/ML IJ SOLN
INTRAMUSCULAR | Status: DC | PRN
Start: 2014-07-21 — End: 2014-07-21
  Administered 2014-07-21: 150 ug via INTRAVENOUS
  Administered 2014-07-21: 50 ug via INTRAVENOUS

## 2014-07-21 MED ORDER — BACITRACIN 50000 UNITS IM SOLR
INTRAMUSCULAR | Status: DC | PRN
  Administered 2014-07-21: 10:00:00

## 2014-07-21 MED ORDER — OXYCODONE-ACETAMINOPHEN 5-325 MG PO TABS
1.0000 | ORAL_TABLET | ORAL | Status: DC | PRN
  Administered 2014-07-21 – 2014-07-23 (×6): 2 via ORAL
  Filled 2014-07-21 (×6): qty 2

## 2014-07-21 MED ORDER — SODIUM CHLORIDE 0.9 % IV SOLN: 250.0000 mL | INTRAVENOUS | Status: DC

## 2014-07-21 MED ORDER — PROPOFOL 10 MG/ML IV BOLUS
INTRAVENOUS | Status: DC | PRN
  Administered 2014-07-21: 200 mg via INTRAVENOUS

## 2014-07-21 MED ORDER — SODIUM CHLORIDE 0.9 % IJ SOLN
3.0000 mL | Freq: Two times a day (BID) | INTRAMUSCULAR | Status: DC
  Administered 2014-07-21 – 2014-07-22 (×2): 3 mL via INTRAVENOUS

## 2014-07-21 MED ORDER — POTASSIUM CHLORIDE IN NACL 20-0.9 MEQ/L-% IV SOLN
INTRAVENOUS | Status: DC
  Filled 2014-07-21 (×6): qty 1000

## 2014-07-21 MED ORDER — GLYCOPYRROLATE 0.2 MG/ML IJ SOLN
INTRAMUSCULAR | Status: DC | PRN
  Administered 2014-07-21: 0.6 mg via INTRAVENOUS
  Administered 2014-07-21: 0.2 mg via INTRAVENOUS

## 2014-07-21 MED ORDER — METHOCARBAMOL 1000 MG/10ML IJ SOLN
500.0000 mg | Freq: Four times a day (QID) | INTRAVENOUS | Status: DC | PRN
  Filled 2014-07-21: qty 5

## 2014-07-21 MED ORDER — ONDANSETRON HCL 4 MG/2ML IJ SOLN: 4.0000 mg | INTRAMUSCULAR | Status: DC | PRN

## 2014-07-21 MED ORDER — HYDROMORPHONE HCL 1 MG/ML IJ SOLN
INTRAMUSCULAR | Status: AC
  Administered 2014-07-21: 0.5 mg via INTRAVENOUS
  Filled 2014-07-21: qty 1

## 2014-07-21 MED ORDER — AMITRIPTYLINE HCL 25 MG PO TABS
25.0000 mg | ORAL_TABLET | Freq: Every day | ORAL | Status: DC
  Administered 2014-07-21 – 2014-07-22 (×2): 25 mg via ORAL
  Filled 2014-07-21 (×3): qty 1

## 2014-07-21 MED ORDER — ALPRAZOLAM 0.25 MG PO TABS: 0.2500 mg | ORAL_TABLET | Freq: Every day | ORAL | Status: DC | PRN

## 2014-07-21 MED ORDER — CEFAZOLIN SODIUM 1-5 GM-% IV SOLN
1.0000 g | Freq: Three times a day (TID) | INTRAVENOUS | Status: AC
  Administered 2014-07-21 – 2014-07-22 (×2): 1 g via INTRAVENOUS
  Filled 2014-07-21 (×2): qty 50

## 2014-07-21 MED ORDER — THROMBIN 5000 UNITS EX SOLR
CUTANEOUS | Status: DC | PRN
  Administered 2014-07-21: 09:00:00 via TOPICAL

## 2014-07-21 MED ORDER — MORPHINE SULFATE 2 MG/ML IJ SOLN: 1.0000 mg | INTRAMUSCULAR | Status: DC | PRN

## 2014-07-21 MED ORDER — DEXAMETHASONE SODIUM PHOSPHATE 10 MG/ML IJ SOLN
10.0000 mg | INTRAMUSCULAR | Status: AC
  Administered 2014-07-21: 10 mg via INTRAVENOUS
  Filled 2014-07-21: qty 1

## 2014-07-21 MED ORDER — ARTIFICIAL TEARS OP OINT
TOPICAL_OINTMENT | OPHTHALMIC | Status: DC | PRN
  Administered 2014-07-21: 1 via OPHTHALMIC

## 2014-07-21 MED ORDER — FENTANYL CITRATE 0.05 MG/ML IJ SOLN
INTRAMUSCULAR | Status: AC
  Filled 2014-07-21: qty 5

## 2014-07-21 MED ORDER — 0.9 % SODIUM CHLORIDE (POUR BTL) OPTIME
TOPICAL | Status: DC | PRN
  Administered 2014-07-21: 1000 mL

## 2014-07-21 MED ORDER — PROPOFOL 10 MG/ML IV BOLUS
INTRAVENOUS | Status: AC
  Filled 2014-07-21: qty 20

## 2014-07-21 MED ORDER — ASPIRIN EC 81 MG PO TBEC
81.0000 mg | DELAYED_RELEASE_TABLET | Freq: Every day | ORAL | Status: DC
  Administered 2014-07-21 – 2014-07-23 (×3): 81 mg via ORAL
  Filled 2014-07-21 (×3): qty 1

## 2014-07-21 MED ORDER — LACTATED RINGERS IV SOLN
INTRAVENOUS | Status: DC | PRN
  Administered 2014-07-21 (×2): via INTRAVENOUS

## 2014-07-21 MED ORDER — THROMBIN 20000 UNITS EX SOLR
CUTANEOUS | Status: DC | PRN
  Administered 2014-07-21: 10:00:00 via TOPICAL

## 2014-07-21 MED ORDER — ONDANSETRON HCL 4 MG/2ML IJ SOLN
INTRAMUSCULAR | Status: DC | PRN
  Administered 2014-07-21: 4 mg via INTRAVENOUS

## 2014-07-21 MED ORDER — LIDOCAINE HCL (CARDIAC) 20 MG/ML IV SOLN
INTRAVENOUS | Status: DC | PRN
  Administered 2014-07-21: 80 mg via INTRAVENOUS

## 2014-07-21 MED ORDER — LACTATED RINGERS IV SOLN
INTRAVENOUS | Status: DC
  Administered 2014-07-21: 08:00:00 via INTRAVENOUS

## 2014-07-21 MED ORDER — PHENYLEPHRINE HCL 10 MG/ML IJ SOLN
10.0000 mg | INTRAVENOUS | Status: DC | PRN
  Administered 2014-07-21: 20 ug/min via INTRAVENOUS

## 2014-07-21 MED ORDER — PRAMIPEXOLE DIHYDROCHLORIDE 1 MG PO TABS
1.0000 mg | ORAL_TABLET | Freq: Two times a day (BID) | ORAL | Status: DC
  Administered 2014-07-21 – 2014-07-23 (×4): 1 mg via ORAL
  Filled 2014-07-21 (×5): qty 1

## 2014-07-21 MED ORDER — NEOSTIGMINE METHYLSULFATE 10 MG/10ML IV SOLN
INTRAVENOUS | Status: DC | PRN
  Administered 2014-07-21: 4 mg via INTRAVENOUS
  Administered 2014-07-21: 1 mg via INTRAVENOUS

## 2014-07-21 MED ORDER — GABAPENTIN 300 MG PO CAPS
300.0000 mg | ORAL_CAPSULE | Freq: Three times a day (TID) | ORAL | Status: DC
  Administered 2014-07-21 – 2014-07-23 (×6): 300 mg via ORAL
  Filled 2014-07-21 (×8): qty 1

## 2014-07-21 MED ORDER — CELECOXIB 200 MG PO CAPS
200.0000 mg | ORAL_CAPSULE | Freq: Every day | ORAL | Status: DC
  Administered 2014-07-21 – 2014-07-23 (×3): 200 mg via ORAL
  Filled 2014-07-21 (×3): qty 1

## 2014-07-21 MED ORDER — ROCURONIUM BROMIDE 100 MG/10ML IV SOLN
INTRAVENOUS | Status: DC | PRN
Start: 2014-07-21 — End: 2014-07-21
  Administered 2014-07-21: 50 mg via INTRAVENOUS
  Administered 2014-07-21: 15 mg via INTRAVENOUS

## 2014-07-21 MED ORDER — ACETAMINOPHEN 650 MG RE SUPP: 650.0000 mg | RECTAL | Status: DC | PRN

## 2014-07-21 MED ORDER — HEMOSTATIC AGENTS (NO CHARGE) OPTIME
TOPICAL | Status: DC | PRN
  Administered 2014-07-21: 1 via TOPICAL

## 2014-07-21 MED ORDER — ARTIFICIAL TEARS OP OINT
TOPICAL_OINTMENT | OPHTHALMIC | Status: AC
  Filled 2014-07-21: qty 3.5

## 2014-07-21 MED ORDER — METHOCARBAMOL 500 MG PO TABS
500.0000 mg | ORAL_TABLET | Freq: Four times a day (QID) | ORAL | Status: DC | PRN
  Administered 2014-07-21 – 2014-07-23 (×3): 500 mg via ORAL
  Filled 2014-07-21 (×4): qty 1

## 2014-07-21 MED ORDER — BUPIVACAINE HCL (PF) 0.25 % IJ SOLN
INTRAMUSCULAR | Status: DC | PRN
  Administered 2014-07-21: 3 mL

## 2014-07-21 MED ORDER — HYDROMORPHONE HCL 1 MG/ML IJ SOLN
0.2500 mg | INTRAMUSCULAR | Status: DC | PRN
  Administered 2014-07-21 (×4): 0.5 mg via INTRAVENOUS

## 2014-07-21 MED ORDER — ONDANSETRON HCL 4 MG/2ML IJ SOLN: 4.0000 mg | Freq: Once | INTRAMUSCULAR | Status: DC | PRN

## 2014-07-21 MED ORDER — MENTHOL 3 MG MT LOZG: 1.0000 | LOZENGE | OROMUCOSAL | Status: DC | PRN

## 2014-07-21 MED ORDER — DULOXETINE HCL 60 MG PO CPEP
60.0000 mg | ORAL_CAPSULE | Freq: Every day | ORAL | Status: DC
  Administered 2014-07-21 – 2014-07-22 (×2): 60 mg via ORAL
  Filled 2014-07-21 (×3): qty 1

## 2014-07-21 MED ORDER — PHENYLEPHRINE HCL 10 MG/ML IJ SOLN
INTRAMUSCULAR | Status: DC | PRN
  Administered 2014-07-21: 80 ug via INTRAVENOUS
  Administered 2014-07-21: 120 ug via INTRAVENOUS

## 2014-07-21 MED ORDER — SODIUM CHLORIDE 0.9 % IJ SOLN: 3.0000 mL | INTRAMUSCULAR | Status: DC | PRN

## 2014-07-21 MED ORDER — ACETAMINOPHEN 325 MG PO TABS: 650.0000 mg | ORAL_TABLET | ORAL | Status: DC | PRN

## 2014-07-21 MED ORDER — PHENOL 1.4 % MT LIQD: 1.0000 | OROMUCOSAL | Status: DC | PRN

## 2014-07-21 SURGICAL SUPPLY — 50 items
ALLOSTEM STRIP 20MMX25MM (Tissue) ×6 IMPLANT
BAG DECANTER FOR FLEXI CONT (MISCELLANEOUS) ×3 IMPLANT
BENZOIN TINCTURE PRP APPL 2/3 (GAUZE/BANDAGES/DRESSINGS) ×3 IMPLANT
BLADE CLIPPER SURG (BLADE) IMPLANT
BUR MATCHSTICK NEURO 3.0 LAGG (BURR) ×3 IMPLANT
CANISTER SUCT 3000ML PPV (MISCELLANEOUS) ×3 IMPLANT
CLOSURE WOUND 1/2 X4 (GAUZE/BANDAGES/DRESSINGS) ×1
CONT SPEC 4OZ CLIKSEAL STRL BL (MISCELLANEOUS) ×6 IMPLANT
COVER BACK TABLE 60X90IN (DRAPES) ×3 IMPLANT
DRAPE C-ARM 42X72 X-RAY (DRAPES) ×3 IMPLANT
DRAPE LAPAROTOMY 100X72X124 (DRAPES) ×3 IMPLANT
DRAPE POUCH INSTRU U-SHP 10X18 (DRAPES) ×3 IMPLANT
DRAPE SURG 17X23 STRL (DRAPES) ×3 IMPLANT
DRSG OPSITE 4X5.5 SM (GAUZE/BANDAGES/DRESSINGS) ×6 IMPLANT
DRSG OPSITE POSTOP 4X6 (GAUZE/BANDAGES/DRESSINGS) ×3 IMPLANT
DRSG TELFA 3X8 NADH (GAUZE/BANDAGES/DRESSINGS) IMPLANT
DURAPREP 26ML APPLICATOR (WOUND CARE) ×3 IMPLANT
ELECT REM PT RETURN 9FT ADLT (ELECTROSURGICAL) ×3
ELECTRODE REM PT RTRN 9FT ADLT (ELECTROSURGICAL) ×1 IMPLANT
EVACUATOR 1/8 PVC DRAIN (DRAIN) IMPLANT
GAUZE SPONGE 4X4 16PLY XRAY LF (GAUZE/BANDAGES/DRESSINGS) IMPLANT
GLOVE BIO SURGEON STRL SZ8 (GLOVE) ×6 IMPLANT
GLOVE ECLIPSE 6.5 STRL STRAW (GLOVE) ×12 IMPLANT
GLOVE INDICATOR 7.0 STRL GRN (GLOVE) ×9 IMPLANT
GOWN STRL REUS W/ TWL LRG LVL3 (GOWN DISPOSABLE) ×2 IMPLANT
GOWN STRL REUS W/ TWL XL LVL3 (GOWN DISPOSABLE) ×2 IMPLANT
GOWN STRL REUS W/TWL 2XL LVL3 (GOWN DISPOSABLE) IMPLANT
GOWN STRL REUS W/TWL LRG LVL3 (GOWN DISPOSABLE) ×4
GOWN STRL REUS W/TWL XL LVL3 (GOWN DISPOSABLE) ×4
HEMOSTAT POWDER KIT SURGIFOAM (HEMOSTASIS) ×3 IMPLANT
KIT BASIN OR (CUSTOM PROCEDURE TRAY) ×3 IMPLANT
KIT ROOM TURNOVER OR (KITS) ×3 IMPLANT
MILL MEDIUM DISP (BLADE) ×3 IMPLANT
NEEDLE HYPO 25X1 1.5 SAFETY (NEEDLE) ×3 IMPLANT
NS IRRIG 1000ML POUR BTL (IV SOLUTION) ×3 IMPLANT
PACK LAMINECTOMY NEURO (CUSTOM PROCEDURE TRAY) ×3 IMPLANT
PAD ARMBOARD 7.5X6 YLW CONV (MISCELLANEOUS) ×9 IMPLANT
SPONGE LAP 4X18 X RAY DECT (DISPOSABLE) IMPLANT
SPONGE SURGIFOAM ABS GEL 100 (HEMOSTASIS) ×6 IMPLANT
STRIP CLOSURE SKIN 1/2X4 (GAUZE/BANDAGES/DRESSINGS) ×2 IMPLANT
SUT VIC AB 0 CT1 18XCR BRD8 (SUTURE) ×2 IMPLANT
SUT VIC AB 0 CT1 8-18 (SUTURE) ×4
SUT VIC AB 2-0 CP2 18 (SUTURE) ×3 IMPLANT
SUT VIC AB 3-0 SH 8-18 (SUTURE) ×9 IMPLANT
SYR 20ML ECCENTRIC (SYRINGE) ×3 IMPLANT
TOWEL OR 17X24 6PK STRL BLUE (TOWEL DISPOSABLE) ×3 IMPLANT
TOWEL OR 17X26 10 PK STRL BLUE (TOWEL DISPOSABLE) ×3 IMPLANT
TRAP SPECIMEN MUCOUS 40CC (MISCELLANEOUS) ×3 IMPLANT
TRAY FOLEY CATH 14FRSI W/METER (CATHETERS) ×3 IMPLANT
WATER STERILE IRR 1000ML POUR (IV SOLUTION) ×3 IMPLANT

## 2014-07-21 NOTE — Anesthesia Postprocedure Evaluation (Signed)
  Anesthesia Post-op Note  Patient: Carrie Keller  Procedure(s) Performed: Procedure(s): Posterior lateral fusion - Lumbar five sacral one with iliac crest autograft, removal of instrumentation, exploration of fusion (N/A)  Patient Location: PACU  Anesthesia Type:General  Level of Consciousness: awake, alert , oriented and patient cooperative  Airway and Oxygen Therapy: Patient Spontanous Breathing  Post-op Pain: mild, moderate  Post-op Assessment: Post-op Vital signs reviewed, Patient's Cardiovascular Status Stable, Respiratory Function Stable, Patent Airway, No signs of Nausea or vomiting and Pain level controlled  Post-op Vital Signs: stable  Last Vitals:  Filed Vitals:   07/21/14 1308  BP:   Pulse: 94  Temp:   Resp: 20    Complications: No apparent anesthesia complications

## 2014-07-21 NOTE — H&P (Signed)
Subjective: Patient is a 69 y.o. female admitted for back pain s/p 2 level PLIF in past. Onset of symptoms was a few months ago, gradually worsening since that time.  The pain is rated severe, and is located at the across the lower back and radiates to LLE occasionally. The pain is described as aching and occurs intermittently. The symptoms have been progressive. Symptoms are exacerbated by exercise. MRI or CT showed pseudoarthrosis L5-S1 with lucency around the S1 screws with scoliosis above.  Past Medical History  Diagnosis Date  . Complication of anesthesia   . Heart murmur 1970    MVP/ asympotmatic  . Depression   . Anxiety     sees Dr Jerl Santos  psychiatrist every 3 month  . DVT (deep venous thrombosis) 2008    RIGHT /POST OP  . Pulmonary embolism 2008  . H/O hiatal hernia   . GERD (gastroesophageal reflux disease)   . Arthritis   . Peripheral vascular disease     venous insufficiency/ bilateral legs  . 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 Hyperlipidemia   . PONV (postoperative nausea and vomiting)     NO PROBLEMS WITH N&V LAST COUPLE OF SURGERIES  . Shortness of breath     WITH EXERTION  . Seizures     last seizure 1967- states reaction to COMPAZINE  . Plantar fasciitis     LEFT  . Neuropathy     BOTH FEET AND LEGS-WORSE ON LEFT SIDE  . Restless leg syndrome   . Scoliosis   . Pain     LOWER BACK WITH PROLONGED BENDING OVER AND STANDING- S/P SPINAL FUSION JAN 2015.    Past Surgical History  Procedure Laterality Date  . Joint replacement  2003/2008    bilateral knees  . Tubal ligation    . Knee arthroscopy      right knee  . Tummy tuck    . External ear surgery      "release of nerve behind right ear for bells palsy"  . Nissen fundoplication  10/25  . Total knee revision Right 07/02/2012    Procedure: TOTAL KNEE REVISION;  Surgeon: Gearlean Alf, MD;  Location: [facility];  Service: Orthopedics;  Laterality: Right;  . Colonoscopy    .  Maximum access (mas)posterior lumbar interbody fusion (plif) 2 level N/A 06/03/2013    Procedure: FOR MAXIMUM ACCESS (MAS) POSTERIOR LUMBAR INTERBODY FUSION (PLIF) 2 LEVEL;  Surgeon: Eustace Moore, MD;  Location: [facility];  Service: Neurosurgery;  Laterality: N/A;  FOR MAXIMUM ACCESS (MAS) POSTERIOR LUMBAR INTERBODY FUSION (PLIF) 2 LEVEL  . Abdominal hysterectomy      VAGINAL HYSTERECTOMY WITH BSO  . Knee arthroscopy Right 01/27/2014    Procedure: ARTHROSCOPY RIGHT KNEE WITH SYNOVECTOMY;  Surgeon: Gearlean Alf, MD;  Location: [facility];  Service: Orthopedics;  Laterality: Right;  . Eye surgery      cataracts    Prior to Admission medications   Medication Sig Start Date End Date Taking? Authorizing Provider  ALPRAZolam Duanne Moron) 0.25 MG tablet Take 0.25 mg by mouth daily as needed for anxiety or sleep.  12/22/13  Yes Historical Provider, MD  amitriptyline (ELAVIL) 25 MG tablet Take 25 mg by mouth at bedtime.  09/02/13  Yes Historical Provider, MD  aspirin EC 81 MG tablet Take 81 mg by mouth daily.   Yes Historical Provider, MD  Calcium Carbonate-Vitamin D (CALCIUM-VITAMIN D) 500-200 MG-UNIT per tablet Take  2 tablets by mouth daily.    Yes Historical Provider, MD  celecoxib (CELEBREX) 200 MG capsule Take 200 mg by mouth daily.  01/18/14  Yes Historical Provider, MD  DULoxetine (CYMBALTA) 60 MG capsule Take 60 mg by mouth at bedtime.    Yes Historical Provider, MD  gabapentin (NEURONTIN) 300 MG capsule TAKE 1 CAPSULE (300 MG TOTAL) BY MOUTH 3 (THREE) TIMES DAILY. 06/07/14  Yes Wallene Huh, DPM  Multiple Vitamins-Minerals (MULTIVITAMIN WITH MINERALS) tablet Take 1 tablet by mouth daily.    Yes Historical Provider, MD  polyethylene glycol (MIRALAX / GLYCOLAX) packet Take 17 g by mouth daily as needed for mild constipation.    Yes Historical Provider, MD  pramipexole (MIRAPEX) 1 MG tablet Take 1 mg by mouth 2 (two) times daily.    Yes Historical Provider, MD  pravastatin (PRAVACHOL) 40 MG tablet Take 40  mg by mouth daily. 06/21/14  Yes Historical Provider, MD  traZODone (DESYREL) 100 MG tablet Take 100 mg by mouth at bedtime as needed for sleep.  06/14/14  Yes Historical Provider, MD  acetaminophen (TYLENOL) 500 MG tablet Take 1,000 mg by mouth every 6 (six) hours as needed for headache (pain).    Historical Provider, MD  HYDROcodone-acetaminophen (NORCO) 5-325 MG per tablet Take 1-2 tablets by mouth every 4 (four) hours as needed for moderate pain. Patient not taking: Reported on 07/12/2014 01/27/14   Gaynelle Arabian, MD  methocarbamol (ROBAXIN) 500 MG tablet Take 1 tablet (500 mg total) by mouth every 6 (six) hours as needed for muscle spasms. Patient not taking: Reported on 07/12/2014 06/04/13   Eustace Moore, MD  rivaroxaban (XARELTO) 10 MG TABS tablet Take 1 tablet (10 mg total) by mouth daily. Patient not taking: Reported on 07/12/2014 01/28/14   Gaynelle Arabian, MD   Allergies  Allergen Reactions  . Compazine [Prochlorperazine Edisylate] Other (See Comments)    Pt reports to having torticollis symptoms and seizure activity with compazine    History  Substance Use Topics  . Smoking status: Former Smoker -- 0.25 packs/day for 10 years    Types: Cigarettes    Quit date: 06/25/1984  . Smokeless tobacco: Never Used  . Alcohol Use: Yes     Comment: 1 glass wine week    History reviewed. No pertinent family history.   Review of Systems  Positive ROS: neg  All other systems have been reviewed and were otherwise negative with the exception of those mentioned in the HPI and as above.  Objective: Vital signs in last 24 hours: Temp:  [97.8 F (36.6 C)] 97.8 F (36.6 C) (03/09 0740) Pulse Rate:  [90] 90 (03/09 0740) Resp:  [16] 16 (03/09 0740) BP: (151)/(101) 151/101 mmHg (03/09 0740) SpO2:  [98 %] 98 % (03/09 0740) Weight:  [182 lb (82.555 kg)] 182 lb (82.555 kg) (03/09 0740)  General Appearance: Alert, cooperative, no distress, appears stated age Head: Normocephalic, without obvious  abnormality, atraumatic Eyes: PERRL, conjunctiva/corneas clear, EOM's intact    Neck: Supple, symmetrical, trachea midline Back: Symmetric, no curvature, ROM normal, no CVA tenderness Lungs:  respirations unlabored Heart: Regular rate and rhythm Abdomen: Soft, non-tender Extremities: Extremities normal, atraumatic, no cyanosis or edema Pulses: 2+ and symmetric all extremities Skin: Skin color, texture, turgor normal, no rashes or lesions  NEUROLOGIC:   Mental status: Alert and oriented x4,  no aphasia, good attention span, fund of knowledge, and memory Motor Exam - grossly normal Sensory Exam - grossly normal Reflexes: 1+ Coordination - grossly  normal Gait - grossly normal Balance - grossly normal Cranial Nerves: I: smell Not tested  II: visual acuity  OS: nl    OD: nl  II: visual fields Full to confrontation  II: pupils Equal, round, reactive to light  III,VII: ptosis None  III,IV,VI: extraocular muscles  Full ROM  V: mastication Normal  V: facial light touch sensation  Normal  V,VII: corneal reflex  Present  VII: facial muscle function - upper  Normal  VII: facial muscle function - lower Normal  VIII: hearing Not tested  IX: soft palate elevation  Normal  IX,X: gag reflex Present  XI: trapezius strength  5/5  XI: sternocleidomastoid strength 5/5  XI: neck flexion strength  5/5  XII: tongue strength  Normal    Data Review Lab Results  Component Value Date   WBC 6.7 07/13/2014   HGB 13.9 07/13/2014   HCT 42.7 07/13/2014   MCV 98.4 07/13/2014   PLT 250 07/13/2014   Lab Results  Component Value Date   NA 140 07/13/2014   K 4.3 07/13/2014   CL 104 07/13/2014   CO2 26 07/13/2014   BUN 9 07/13/2014   CREATININE 0.68 07/13/2014   GLUCOSE 96 07/13/2014   Lab Results  Component Value Date   INR 0.96 07/13/2014    Assessment/Plan: Patient admitted for redo fusion L5-S1 with revision of instrumentation. Patient has failed a reasonable attempt at conservative  therapy.  I explained the condition and procedure to the patient and answered any questions.  Patient wishes to proceed with procedure as planned. Understands risks/ benefits and typical outcomes of procedure.   Mahira Petras S 07/21/2014 9:36 AM

## 2014-07-21 NOTE — Progress Notes (Signed)
Utilization review completed.  

## 2014-07-21 NOTE — Op Note (Signed)
07/21/2014  12:06 PM  PATIENT:  Carrie Keller  69 y.o. female  PRE-OPERATIVE DIAGNOSIS:  Pseudoarthrosis L5-S1 with lucency around S1 pedicle screws, back and leg pain  POST-OPERATIVE DIAGNOSIS:  Same  PROCEDURE:  1. Exploration of fusion L4-S1 to confirm pseudoarthrosis at L5-S1, 2. Removal of segmental instrumentation L4-S1, 3. Posterior lateral fusion L5-S1 utilizing morcellized iliac crest autograft obtained through a separate fascial incision and morcellized allograft  SURGEON:  Sherley Bounds, MD  ASSISTANTS: dr Christella Noa  ANESTHESIA:   General  EBL: 150 ml  Total I/O In: 1000 [I.V.:1000] Out: 325 [Urine:175; Blood:150]  BLOOD ADMINISTERED:none  DRAINS: None   SPECIMEN:  No Specimen  INDICATION FOR PROCEDURE: This patient presented about 1-1/2 years after a 2 level lumbar interbody fusion with back and leg pain. He T scan showed adjacent level spondylosis and scoliosis, vacuum phenomenon in the SI joints, and a suspected pseudoarthrosis at L5-S1. She tried medical management without relief. Her pain was progressive and debilitating. I recommended a lumbar reexploration with removal of hardware and a posterior lateral fusion L5-S1 with iliac crest autograft. Patient understood the risks, benefits, and alternatives and potential outcomes and wished to proceed.  PROCEDURE DETAILS: The patient was taken to the operating room and after induction of adequate generalized endotracheal anesthesia she was rolled to the prone position on the chest rolls and pressure points were padded. The lumbar region was cleaned with Betadine scrub and then prepped with DuraPrep and then draped in usual sterile fashion. 3 mL of local anesthesia was injected and a dorsal midline incision was made and carried down to the lumbosacral fascia. The fascia was opened the paraspinous musculature was taken down in a subcutaneous periosteal fashion to expose process of L3 as well as the previously placed hardware. The  locking caps were removed and the rods were removed. The S1 pedicle screws were obviously loose. These were removed and I palpated with a ball probe. It felt like that there was too much lucency around the screws to accept a larger pedicle screw with any significant bite. Remove the other 4 pedicle screws also. The L5 pedicle screws appeared to be slightly loose with the L4 pedicle screw seemed to have good purchase. The old fusion was explored and there appeared to be a pseudoarthrosis at L5-S1. I the performed a suprafascial exposure of the right iliac crest. Using chisels and gouges I removed leaving a crest autograft from the right iliac crest. Then irrigated with saline solution and dried iliac crest with Surgifoam and with dry Gelfoam. The fascia over the iliac crest was then closed. I then reexposed the L5 S1 region and exposed the transverse process of L5 and the sacral alar. These were then decorticated and a mixture of iliac crest autograft and morcellized allograft was placed posterior laterally to perform arthrodesis L5-S1. There irrigated the wound with copious minus of bacitracin containing saline solution. Muscle and the fascia were closed with 0 Vicryl. The subcutaneous tissues were closed with 2-0 Vicryl in the subcuticular tissue was closed with 3-0 Vicryl. The skin was closed with benzoin and Steri-Strips. A removed. A sterile dressing was applied. The patient was then awakened from general anesthesia and transferred to recovery room in stable condition. At the end of the procedure all sponge needle and instrument counts were correct.  PLAN OF CARE: Admit for overnight observation  PATIENT DISPOSITION:  PACU - hemodynamically stable.   Delay start of Pharmacological VTE agent (>24hrs) due to surgical blood loss or risk  of bleeding:  yes

## 2014-07-21 NOTE — Transfer of Care (Signed)
Immediate Anesthesia Transfer of Care Note  Patient: Carrie Keller  Procedure(s) Performed: Procedure(s): Posterior lateral fusion - Lumbar five sacral one with iliac crest autograft, removal of instrumentation, exploration of fusion (N/A)  Patient Location: PACU  Anesthesia Type:General  Level of Consciousness: awake, alert , oriented and patient cooperative  Airway & Oxygen Therapy: Patient Spontanous Breathing and Patient connected to nasal cannula oxygen  Post-op Assessment: Report given to RN and Post -op Vital signs reviewed and stable  Post vital signs: Reviewed and stable  Last Vitals:  Filed Vitals:   07/21/14 0740  BP: 151/101  Pulse: 90  Temp: 36.6 C  Resp: 16    Complications: No apparent anesthesia complications

## 2014-07-21 NOTE — Anesthesia Preprocedure Evaluation (Addendum)
Anesthesia Evaluation  Patient identified by MRN, date of birth, ID band Patient awake    Reviewed: Allergy & Precautions, NPO status , Patient's Chart, lab work & pertinent test results  History of Anesthesia Complications (+) PONV  Airway        Dental   Pulmonary former smoker,          Cardiovascular + Peripheral Vascular Disease     Neuro/Psych Seizures -,   Neuromuscular disease    GI/Hepatic hiatal hernia, GERD-  ,  Endo/Other    Renal/GU      Musculoskeletal  (+) Arthritis -,   Abdominal   Peds  Hematology  (+) anemia ,   Anesthesia Other Findings   Reproductive/Obstetrics                           Anesthesia Physical Anesthesia Plan  ASA: II  Anesthesia Plan: General   Post-op Pain Management:    Induction: Intravenous  Airway Management Planned: Oral ETT  Additional Equipment:   Intra-op Plan:   Post-operative Plan: Extubation in OR  Informed Consent: I have reviewed the patients History and Physical, chart, labs and discussed the procedure including the risks, benefits and alternatives for the proposed anesthesia with the patient or authorized representative who has indicated his/her understanding and acceptance.     Plan Discussed with: CRNA, Anesthesiologist and Surgeon  Anesthesia Plan Comments:        Anesthesia Quick Evaluation

## 2014-07-23 MED ORDER — METHOCARBAMOL 500 MG PO TABS: 500.0000 mg | ORAL_TABLET | Freq: Four times a day (QID) | ORAL | Status: DC | PRN

## 2014-07-23 MED ORDER — OXYCODONE-ACETAMINOPHEN 5-325 MG PO TABS: 1.0000 | ORAL_TABLET | Freq: Four times a day (QID) | ORAL | Status: DC | PRN

## 2014-07-23 NOTE — Discharge Summary (Signed)
Physician Discharge Summary  Patient ID: ANAALICIA REIMANN MRN: [id number] DOB/AGE: December 08, 1945 69 y.o.  Admit date: 07/21/2014 Discharge date: 07/23/2014  Admission Diagnoses:Pseudoarthrosis L5-S1 with lucency around S1 pedicle screws, back and leg pain    Discharge Diagnoses: Pseudoarthrosis L5-S1 with lucency around S1 pedicle screws, back and leg pain   Active Problems:   S/P lumbar spinal fusion   S/P lumbar fusion   Discharged Condition: good  Hospital Course: Mrs. Curvin was admitted and taken to the operating room for an uncomplicated lumbar exploration. Dr. Ronnald Ramp determined he would not be able to placed interbody devices. Thus he completed a posterolateral arthrodesis at L5/S1. He removed the hardware at L4. Post op her wound is clean, dry, and without signs of infection. She is voiding, ambulating, and tolerating a regular diet.   Treatments: surgery: 1. Exploration of fusion L4-S1 to confirm pseudoarthrosis at L5-S1, 2. Removal of segmental instrumentation L4-S1, 3. Posterior lateral fusion L5-S1 utilizing morcellized iliac crest autograft obtained through a separate fascial incision and morcellized allograft   Discharge Exam: Blood pressure 109/76, pulse 89, temperature 97.9 F (36.6 C), temperature source Oral, resp. rate 18, height 5\' 1"  (1.549 m), weight 82.555 kg (182 lb), SpO2 95 %. General appearance: alert, cooperative and appears stated age Neurologic: Alert and oriented X 3, normal strength and tone. Normal symmetric reflexes. Normal coordination and gait  Disposition: 01-Home or Self Care Pseudoarthrosis    Medication List    TAKE these medications        acetaminophen 500 MG tablet  Commonly known as:  TYLENOL  Take 1,000 mg by mouth every 6 (six) hours as needed for headache (pain).     ALPRAZolam 0.25 MG tablet  Commonly known as:  XANAX  Take 0.25 mg by mouth daily as needed for anxiety or sleep.     amitriptyline 25 MG tablet  Commonly known as:   ELAVIL  Take 25 mg by mouth at bedtime.     aspirin EC 81 MG tablet  Take 81 mg by mouth daily.     calcium-vitamin D 500-200 MG-UNIT per tablet  Take 2 tablets by mouth daily.     celecoxib 200 MG capsule  Commonly known as:  CELEBREX  Take 200 mg by mouth daily.     DULoxetine 60 MG capsule  Commonly known as:  CYMBALTA  Take 60 mg by mouth at bedtime.     gabapentin 300 MG capsule  Commonly known as:  NEURONTIN  TAKE 1 CAPSULE (300 MG TOTAL) BY MOUTH 3 (THREE) TIMES DAILY.     HYDROcodone-acetaminophen 5-325 MG per tablet  Commonly known as:  NORCO  Take 1-2 tablets by mouth every 4 (four) hours as needed for moderate pain.     methocarbamol 500 MG tablet  Commonly known as:  ROBAXIN  Take 1 tablet (500 mg total) by mouth every 6 (six) hours as needed for muscle spasms.     methocarbamol 500 MG tablet  Commonly known as:  ROBAXIN  Take 1 tablet (500 mg total) by mouth every 6 (six) hours as needed for muscle spasms.     multivitamin with minerals tablet  Take 1 tablet by mouth daily.     oxyCODONE-acetaminophen 5-325 MG per tablet  Commonly known as:  PERCOCET/ROXICET  Take 1-2 tablets by mouth every 6 (six) hours as needed for moderate pain.     polyethylene glycol packet  Commonly known as:  MIRALAX / GLYCOLAX  Take 17 g by mouth daily as  needed for mild constipation.     pramipexole 1 MG tablet  Commonly known as:  MIRAPEX  Take 1 mg by mouth 2 (two) times daily.     pravastatin 40 MG tablet  Commonly known as:  PRAVACHOL  Take 40 mg by mouth daily.     rivaroxaban 10 MG Tabs tablet  Commonly known as:  XARELTO  Take 1 tablet (10 mg total) by mouth daily.     traZODone 100 MG tablet  Commonly known as:  DESYREL  Take 100 mg by mouth at bedtime as needed for sleep.         Signed: Angelika Jerrett L 07/23/2014, 1:07 PM

## 2014-07-23 NOTE — Discharge Instructions (Signed)
Wound Care Keep incision covered and dry for 3 days.  You may remove outer bandage after 3 days.  Do not put any creams, lotions, or ointments on incision. Leave steri-strips on back.  They will fall off by themselves. Activity Walk each and every day, increasing distance each day. No lifting greater than 5 lbs.  Avoid bending, arching, or twisting. No driving for 2 weeks; may ride as a passenger locally. If provided with back brace, wear when out of bed.  It is not necessary to wear in bed. Diet Resume your normal diet.  Return to Work Will be discussed at you follow up appointment. Call Your Doctor If Any of These Occur Redness, drainage, or swelling at the wound.  Temperature greater than 101 degrees. Severe pain not relieved by pain medication. Incision starts to come apart. Follow Up Appt Call today for appointment in 1-2 weeks [phone]) or for problems.  If you have any hardware placed in your spine, you will need an x-ray before your appointment.

## 2014-07-23 NOTE — Progress Notes (Signed)
Patient alert and oriented, mae's well, voiding adequate amount of urine, swallowing without difficulty, no c/o pain. Patient discharged home with family. Script and discharged instructions given to patient. Patient and family stated understanding of d/c instructions given and has an appointment with MD. Aisha Tevin Shillingford RN 

## 2014-09-17 DIAGNOSIS — Z6833 Body mass index (BMI) 33.0-33.9, adult: Secondary | ICD-10-CM | POA: Diagnosis not present

## 2014-09-17 DIAGNOSIS — S32009K Unspecified fracture of unspecified lumbar vertebra, subsequent encounter for fracture with nonunion: Secondary | ICD-10-CM | POA: Diagnosis not present

## 2014-10-19 DIAGNOSIS — L821 Other seborrheic keratosis: Secondary | ICD-10-CM | POA: Diagnosis not present

## 2014-10-19 DIAGNOSIS — L820 Inflamed seborrheic keratosis: Secondary | ICD-10-CM | POA: Diagnosis not present

## 2014-10-20 DIAGNOSIS — F3341 Major depressive disorder, recurrent, in partial remission: Secondary | ICD-10-CM | POA: Diagnosis not present

## 2014-11-03 DIAGNOSIS — M25511 Pain in right shoulder: Secondary | ICD-10-CM | POA: Diagnosis not present

## 2014-11-03 DIAGNOSIS — M25521 Pain in right elbow: Secondary | ICD-10-CM | POA: Diagnosis not present

## 2014-11-04 DIAGNOSIS — Z79899 Other long term (current) drug therapy: Secondary | ICD-10-CM | POA: Diagnosis not present

## 2014-11-04 DIAGNOSIS — D649 Anemia, unspecified: Secondary | ICD-10-CM | POA: Diagnosis not present

## 2014-11-04 DIAGNOSIS — R5381 Other malaise: Secondary | ICD-10-CM | POA: Diagnosis not present

## 2014-11-04 DIAGNOSIS — R5383 Other fatigue: Secondary | ICD-10-CM | POA: Diagnosis not present

## 2014-11-04 DIAGNOSIS — D509 Iron deficiency anemia, unspecified: Secondary | ICD-10-CM | POA: Diagnosis not present

## 2014-11-04 DIAGNOSIS — E559 Vitamin D deficiency, unspecified: Secondary | ICD-10-CM | POA: Diagnosis not present

## 2014-11-10 DIAGNOSIS — F331 Major depressive disorder, recurrent, moderate: Secondary | ICD-10-CM | POA: Diagnosis not present

## 2014-11-17 DIAGNOSIS — F3341 Major depressive disorder, recurrent, in partial remission: Secondary | ICD-10-CM | POA: Diagnosis not present

## 2014-11-29 DIAGNOSIS — E559 Vitamin D deficiency, unspecified: Secondary | ICD-10-CM | POA: Diagnosis not present

## 2014-11-29 DIAGNOSIS — N182 Chronic kidney disease, stage 2 (mild): Secondary | ICD-10-CM | POA: Diagnosis not present

## 2014-11-29 DIAGNOSIS — F339 Major depressive disorder, recurrent, unspecified: Secondary | ICD-10-CM | POA: Diagnosis not present

## 2014-11-29 DIAGNOSIS — E785 Hyperlipidemia, unspecified: Secondary | ICD-10-CM | POA: Diagnosis not present

## 2014-11-29 DIAGNOSIS — Z79899 Other long term (current) drug therapy: Secondary | ICD-10-CM | POA: Diagnosis not present

## 2014-11-29 DIAGNOSIS — Z13820 Encounter for screening for osteoporosis: Secondary | ICD-10-CM | POA: Diagnosis not present

## 2014-11-29 DIAGNOSIS — N951 Menopausal and female climacteric states: Secondary | ICD-10-CM | POA: Diagnosis not present

## 2014-11-29 DIAGNOSIS — R785 Finding of other psychotropic drug in blood: Secondary | ICD-10-CM | POA: Diagnosis not present

## 2014-11-29 DIAGNOSIS — K219 Gastro-esophageal reflux disease without esophagitis: Secondary | ICD-10-CM | POA: Diagnosis not present

## 2014-11-29 DIAGNOSIS — Z1231 Encounter for screening mammogram for malignant neoplasm of breast: Secondary | ICD-10-CM | POA: Diagnosis not present

## 2014-11-29 DIAGNOSIS — Z6833 Body mass index (BMI) 33.0-33.9, adult: Secondary | ICD-10-CM | POA: Diagnosis not present

## 2014-11-29 DIAGNOSIS — G2581 Restless legs syndrome: Secondary | ICD-10-CM | POA: Diagnosis not present

## 2014-11-29 DIAGNOSIS — Z136 Encounter for screening for cardiovascular disorders: Secondary | ICD-10-CM | POA: Diagnosis not present

## 2014-11-29 DIAGNOSIS — Z0000 Encounter for general adult medical examination without abnormal findings: Secondary | ICD-10-CM | POA: Diagnosis not present

## 2014-11-29 DIAGNOSIS — E6609 Other obesity due to excess calories: Secondary | ICD-10-CM | POA: Diagnosis not present

## 2014-12-08 DIAGNOSIS — F3341 Major depressive disorder, recurrent, in partial remission: Secondary | ICD-10-CM | POA: Diagnosis not present

## 2014-12-13 DIAGNOSIS — Z1231 Encounter for screening mammogram for malignant neoplasm of breast: Secondary | ICD-10-CM | POA: Diagnosis not present

## 2014-12-15 DIAGNOSIS — Z13820 Encounter for screening for osteoporosis: Secondary | ICD-10-CM | POA: Diagnosis not present

## 2014-12-15 DIAGNOSIS — N959 Unspecified menopausal and perimenopausal disorder: Secondary | ICD-10-CM | POA: Diagnosis not present

## 2014-12-20 DIAGNOSIS — S32009K Unspecified fracture of unspecified lumbar vertebra, subsequent encounter for fracture with nonunion: Secondary | ICD-10-CM | POA: Diagnosis not present

## 2014-12-20 DIAGNOSIS — M545 Low back pain: Secondary | ICD-10-CM | POA: Diagnosis not present

## 2014-12-28 DIAGNOSIS — L74519 Primary focal hyperhidrosis, unspecified: Secondary | ICD-10-CM | POA: Diagnosis not present

## 2015-01-05 DIAGNOSIS — L74519 Primary focal hyperhidrosis, unspecified: Secondary | ICD-10-CM | POA: Diagnosis not present

## 2015-01-06 DIAGNOSIS — F3341 Major depressive disorder, recurrent, in partial remission: Secondary | ICD-10-CM | POA: Diagnosis not present

## 2015-01-11 DIAGNOSIS — Z23 Encounter for immunization: Secondary | ICD-10-CM | POA: Diagnosis not present

## 2015-01-26 DIAGNOSIS — E6609 Other obesity due to excess calories: Secondary | ICD-10-CM | POA: Diagnosis not present

## 2015-01-26 DIAGNOSIS — Z6834 Body mass index (BMI) 34.0-34.9, adult: Secondary | ICD-10-CM | POA: Diagnosis not present

## 2015-01-31 DIAGNOSIS — L74519 Primary focal hyperhidrosis, unspecified: Secondary | ICD-10-CM | POA: Diagnosis not present

## 2015-02-14 DIAGNOSIS — F330 Major depressive disorder, recurrent, mild: Secondary | ICD-10-CM | POA: Diagnosis not present

## 2015-02-22 DIAGNOSIS — H04129 Dry eye syndrome of unspecified lacrimal gland: Secondary | ICD-10-CM | POA: Diagnosis not present

## 2015-02-23 DIAGNOSIS — E6609 Other obesity due to excess calories: Secondary | ICD-10-CM | POA: Diagnosis not present

## 2015-02-23 DIAGNOSIS — Z6834 Body mass index (BMI) 34.0-34.9, adult: Secondary | ICD-10-CM | POA: Diagnosis not present

## 2015-03-18 DIAGNOSIS — R110 Nausea: Secondary | ICD-10-CM | POA: Diagnosis not present

## 2015-03-18 DIAGNOSIS — H6121 Impacted cerumen, right ear: Secondary | ICD-10-CM | POA: Diagnosis not present

## 2015-03-18 DIAGNOSIS — R5383 Other fatigue: Secondary | ICD-10-CM | POA: Diagnosis not present

## 2015-03-18 DIAGNOSIS — K644 Residual hemorrhoidal skin tags: Secondary | ICD-10-CM | POA: Diagnosis not present

## 2015-03-18 DIAGNOSIS — K921 Melena: Secondary | ICD-10-CM | POA: Diagnosis not present

## 2015-03-18 DIAGNOSIS — H8113 Benign paroxysmal vertigo, bilateral: Secondary | ICD-10-CM | POA: Diagnosis not present

## 2015-03-21 DIAGNOSIS — F3341 Major depressive disorder, recurrent, in partial remission: Secondary | ICD-10-CM | POA: Diagnosis not present

## 2015-03-23 DIAGNOSIS — Z6833 Body mass index (BMI) 33.0-33.9, adult: Secondary | ICD-10-CM | POA: Diagnosis not present

## 2015-03-29 DIAGNOSIS — H6121 Impacted cerumen, right ear: Secondary | ICD-10-CM | POA: Diagnosis not present

## 2015-03-29 DIAGNOSIS — H9191 Unspecified hearing loss, right ear: Secondary | ICD-10-CM | POA: Diagnosis not present

## 2015-04-14 DIAGNOSIS — H6121 Impacted cerumen, right ear: Secondary | ICD-10-CM | POA: Diagnosis not present

## 2015-06-01 DIAGNOSIS — G8929 Other chronic pain: Secondary | ICD-10-CM | POA: Diagnosis not present

## 2015-06-01 DIAGNOSIS — N959 Unspecified menopausal and perimenopausal disorder: Secondary | ICD-10-CM | POA: Diagnosis not present

## 2015-06-01 DIAGNOSIS — Z79899 Other long term (current) drug therapy: Secondary | ICD-10-CM | POA: Diagnosis not present

## 2015-06-01 DIAGNOSIS — R350 Frequency of micturition: Secondary | ICD-10-CM | POA: Diagnosis not present

## 2015-06-01 DIAGNOSIS — K219 Gastro-esophageal reflux disease without esophagitis: Secondary | ICD-10-CM | POA: Diagnosis not present

## 2015-06-01 DIAGNOSIS — E785 Hyperlipidemia, unspecified: Secondary | ICD-10-CM | POA: Diagnosis not present

## 2015-06-01 DIAGNOSIS — F3342 Major depressive disorder, recurrent, in full remission: Secondary | ICD-10-CM | POA: Diagnosis not present

## 2015-06-01 DIAGNOSIS — M170 Bilateral primary osteoarthritis of knee: Secondary | ICD-10-CM | POA: Diagnosis not present

## 2015-06-01 DIAGNOSIS — N390 Urinary tract infection, site not specified: Secondary | ICD-10-CM | POA: Diagnosis not present

## 2015-06-01 DIAGNOSIS — M545 Low back pain: Secondary | ICD-10-CM | POA: Diagnosis not present

## 2015-06-01 DIAGNOSIS — J069 Acute upper respiratory infection, unspecified: Secondary | ICD-10-CM | POA: Diagnosis not present

## 2015-06-07 DIAGNOSIS — F3341 Major depressive disorder, recurrent, in partial remission: Secondary | ICD-10-CM | POA: Diagnosis not present

## 2015-06-08 DIAGNOSIS — F419 Anxiety disorder, unspecified: Secondary | ICD-10-CM | POA: Diagnosis not present

## 2015-06-08 DIAGNOSIS — M5136 Other intervertebral disc degeneration, lumbar region: Secondary | ICD-10-CM | POA: Diagnosis not present

## 2015-06-08 DIAGNOSIS — Z96652 Presence of left artificial knee joint: Secondary | ICD-10-CM | POA: Diagnosis not present

## 2015-06-08 DIAGNOSIS — G894 Chronic pain syndrome: Secondary | ICD-10-CM | POA: Diagnosis not present

## 2015-06-08 DIAGNOSIS — Z96651 Presence of right artificial knee joint: Secondary | ICD-10-CM | POA: Diagnosis not present

## 2015-06-08 DIAGNOSIS — M961 Postlaminectomy syndrome, not elsewhere classified: Secondary | ICD-10-CM | POA: Diagnosis not present

## 2015-06-08 DIAGNOSIS — M25561 Pain in right knee: Secondary | ICD-10-CM | POA: Diagnosis not present

## 2015-06-08 DIAGNOSIS — M5416 Radiculopathy, lumbar region: Secondary | ICD-10-CM | POA: Diagnosis not present

## 2015-07-05 DIAGNOSIS — M961 Postlaminectomy syndrome, not elsewhere classified: Secondary | ICD-10-CM | POA: Diagnosis not present

## 2015-07-05 DIAGNOSIS — M5136 Other intervertebral disc degeneration, lumbar region: Secondary | ICD-10-CM | POA: Diagnosis not present

## 2015-07-05 DIAGNOSIS — F419 Anxiety disorder, unspecified: Secondary | ICD-10-CM | POA: Diagnosis not present

## 2015-07-05 DIAGNOSIS — M25561 Pain in right knee: Secondary | ICD-10-CM | POA: Diagnosis not present

## 2015-07-05 DIAGNOSIS — Z96652 Presence of left artificial knee joint: Secondary | ICD-10-CM | POA: Diagnosis not present

## 2015-07-05 DIAGNOSIS — Z96651 Presence of right artificial knee joint: Secondary | ICD-10-CM | POA: Diagnosis not present

## 2015-07-05 DIAGNOSIS — M5416 Radiculopathy, lumbar region: Secondary | ICD-10-CM | POA: Diagnosis not present

## 2015-07-05 DIAGNOSIS — G894 Chronic pain syndrome: Secondary | ICD-10-CM | POA: Diagnosis not present

## 2015-07-13 DIAGNOSIS — F3341 Major depressive disorder, recurrent, in partial remission: Secondary | ICD-10-CM | POA: Diagnosis not present

## 2015-07-21 DIAGNOSIS — S60511A Abrasion of right hand, initial encounter: Secondary | ICD-10-CM | POA: Diagnosis not present

## 2015-07-21 DIAGNOSIS — J069 Acute upper respiratory infection, unspecified: Secondary | ICD-10-CM | POA: Diagnosis not present

## 2015-07-21 DIAGNOSIS — G894 Chronic pain syndrome: Secondary | ICD-10-CM | POA: Diagnosis not present

## 2015-08-02 DIAGNOSIS — G2581 Restless legs syndrome: Secondary | ICD-10-CM | POA: Diagnosis not present

## 2015-08-02 DIAGNOSIS — M5416 Radiculopathy, lumbar region: Secondary | ICD-10-CM | POA: Diagnosis not present

## 2015-08-02 DIAGNOSIS — M25561 Pain in right knee: Secondary | ICD-10-CM | POA: Diagnosis not present

## 2015-08-02 DIAGNOSIS — M961 Postlaminectomy syndrome, not elsewhere classified: Secondary | ICD-10-CM | POA: Diagnosis not present

## 2015-08-02 DIAGNOSIS — M5136 Other intervertebral disc degeneration, lumbar region: Secondary | ICD-10-CM | POA: Diagnosis not present

## 2015-08-02 DIAGNOSIS — Z96652 Presence of left artificial knee joint: Secondary | ICD-10-CM | POA: Diagnosis not present

## 2015-08-02 DIAGNOSIS — G894 Chronic pain syndrome: Secondary | ICD-10-CM | POA: Diagnosis not present

## 2015-08-02 DIAGNOSIS — F419 Anxiety disorder, unspecified: Secondary | ICD-10-CM | POA: Diagnosis not present

## 2015-08-02 DIAGNOSIS — Z96651 Presence of right artificial knee joint: Secondary | ICD-10-CM | POA: Diagnosis not present

## 2015-08-19 DIAGNOSIS — R350 Frequency of micturition: Secondary | ICD-10-CM | POA: Diagnosis not present

## 2015-08-19 DIAGNOSIS — J069 Acute upper respiratory infection, unspecified: Secondary | ICD-10-CM | POA: Diagnosis not present

## 2015-08-19 DIAGNOSIS — G4719 Other hypersomnia: Secondary | ICD-10-CM | POA: Diagnosis not present

## 2015-08-19 DIAGNOSIS — R5383 Other fatigue: Secondary | ICD-10-CM | POA: Diagnosis not present

## 2015-08-22 DIAGNOSIS — R5383 Other fatigue: Secondary | ICD-10-CM | POA: Diagnosis not present

## 2015-08-31 DIAGNOSIS — M5416 Radiculopathy, lumbar region: Secondary | ICD-10-CM | POA: Diagnosis not present

## 2015-08-31 DIAGNOSIS — F419 Anxiety disorder, unspecified: Secondary | ICD-10-CM | POA: Diagnosis not present

## 2015-08-31 DIAGNOSIS — G2581 Restless legs syndrome: Secondary | ICD-10-CM | POA: Diagnosis not present

## 2015-08-31 DIAGNOSIS — G894 Chronic pain syndrome: Secondary | ICD-10-CM | POA: Diagnosis not present

## 2015-08-31 DIAGNOSIS — M5136 Other intervertebral disc degeneration, lumbar region: Secondary | ICD-10-CM | POA: Diagnosis not present

## 2015-08-31 DIAGNOSIS — M961 Postlaminectomy syndrome, not elsewhere classified: Secondary | ICD-10-CM | POA: Diagnosis not present

## 2015-08-31 DIAGNOSIS — Z96652 Presence of left artificial knee joint: Secondary | ICD-10-CM | POA: Diagnosis not present

## 2015-08-31 DIAGNOSIS — M25561 Pain in right knee: Secondary | ICD-10-CM | POA: Diagnosis not present

## 2015-08-31 DIAGNOSIS — Z96651 Presence of right artificial knee joint: Secondary | ICD-10-CM | POA: Diagnosis not present

## 2015-09-14 DIAGNOSIS — F3341 Major depressive disorder, recurrent, in partial remission: Secondary | ICD-10-CM | POA: Diagnosis not present

## 2015-09-19 DIAGNOSIS — B9620 Unspecified Escherichia coli [E. coli] as the cause of diseases classified elsewhere: Secondary | ICD-10-CM | POA: Diagnosis not present

## 2015-09-19 DIAGNOSIS — N390 Urinary tract infection, site not specified: Secondary | ICD-10-CM | POA: Diagnosis not present

## 2015-09-19 DIAGNOSIS — R1030 Lower abdominal pain, unspecified: Secondary | ICD-10-CM | POA: Diagnosis not present

## 2015-09-19 DIAGNOSIS — G629 Polyneuropathy, unspecified: Secondary | ICD-10-CM | POA: Diagnosis not present

## 2015-09-19 DIAGNOSIS — R350 Frequency of micturition: Secondary | ICD-10-CM | POA: Diagnosis not present

## 2015-09-30 DIAGNOSIS — N390 Urinary tract infection, site not specified: Secondary | ICD-10-CM | POA: Diagnosis not present

## 2015-09-30 DIAGNOSIS — R300 Dysuria: Secondary | ICD-10-CM | POA: Diagnosis not present

## 2015-10-13 DIAGNOSIS — M545 Low back pain: Secondary | ICD-10-CM | POA: Diagnosis not present

## 2015-10-13 DIAGNOSIS — G8929 Other chronic pain: Secondary | ICD-10-CM | POA: Diagnosis not present

## 2015-10-13 DIAGNOSIS — R300 Dysuria: Secondary | ICD-10-CM | POA: Diagnosis not present

## 2015-10-18 DIAGNOSIS — M5136 Other intervertebral disc degeneration, lumbar region: Secondary | ICD-10-CM | POA: Diagnosis not present

## 2015-10-18 DIAGNOSIS — F419 Anxiety disorder, unspecified: Secondary | ICD-10-CM | POA: Diagnosis not present

## 2015-10-18 DIAGNOSIS — Z96651 Presence of right artificial knee joint: Secondary | ICD-10-CM | POA: Diagnosis not present

## 2015-10-18 DIAGNOSIS — Z96652 Presence of left artificial knee joint: Secondary | ICD-10-CM | POA: Diagnosis not present

## 2015-10-18 DIAGNOSIS — G2581 Restless legs syndrome: Secondary | ICD-10-CM | POA: Diagnosis not present

## 2015-10-18 DIAGNOSIS — M25561 Pain in right knee: Secondary | ICD-10-CM | POA: Diagnosis not present

## 2015-10-18 DIAGNOSIS — M5416 Radiculopathy, lumbar region: Secondary | ICD-10-CM | POA: Diagnosis not present

## 2015-10-18 DIAGNOSIS — G894 Chronic pain syndrome: Secondary | ICD-10-CM | POA: Diagnosis not present

## 2015-10-18 DIAGNOSIS — M961 Postlaminectomy syndrome, not elsewhere classified: Secondary | ICD-10-CM | POA: Diagnosis not present

## 2015-10-21 DIAGNOSIS — R1032 Left lower quadrant pain: Secondary | ICD-10-CM | POA: Diagnosis not present

## 2015-10-21 DIAGNOSIS — N3090 Cystitis, unspecified without hematuria: Secondary | ICD-10-CM | POA: Diagnosis not present

## 2015-10-24 DIAGNOSIS — M2550 Pain in unspecified joint: Secondary | ICD-10-CM | POA: Diagnosis not present

## 2015-10-28 DIAGNOSIS — R109 Unspecified abdominal pain: Secondary | ICD-10-CM | POA: Diagnosis not present

## 2015-10-28 DIAGNOSIS — N3090 Cystitis, unspecified without hematuria: Secondary | ICD-10-CM | POA: Diagnosis not present

## 2015-10-28 DIAGNOSIS — K760 Fatty (change of) liver, not elsewhere classified: Secondary | ICD-10-CM | POA: Diagnosis not present

## 2015-10-28 DIAGNOSIS — R918 Other nonspecific abnormal finding of lung field: Secondary | ICD-10-CM | POA: Diagnosis not present

## 2015-10-28 DIAGNOSIS — N390 Urinary tract infection, site not specified: Secondary | ICD-10-CM | POA: Diagnosis not present

## 2015-11-01 DIAGNOSIS — M79672 Pain in left foot: Secondary | ICD-10-CM | POA: Diagnosis not present

## 2015-11-01 DIAGNOSIS — M19041 Primary osteoarthritis, right hand: Secondary | ICD-10-CM | POA: Diagnosis not present

## 2015-11-01 DIAGNOSIS — R7982 Elevated C-reactive protein (CRP): Secondary | ICD-10-CM | POA: Diagnosis not present

## 2015-11-01 DIAGNOSIS — R768 Other specified abnormal immunological findings in serum: Secondary | ICD-10-CM | POA: Diagnosis not present

## 2015-11-04 ENCOUNTER — Other Ambulatory Visit (HOSPITAL_COMMUNITY): Payer: Self-pay | Admitting: Rheumatology

## 2015-11-04 DIAGNOSIS — M79672 Pain in left foot: Secondary | ICD-10-CM

## 2015-11-07 DIAGNOSIS — N3090 Cystitis, unspecified without hematuria: Secondary | ICD-10-CM | POA: Diagnosis not present

## 2015-11-07 DIAGNOSIS — N952 Postmenopausal atrophic vaginitis: Secondary | ICD-10-CM | POA: Diagnosis not present

## 2015-11-09 ENCOUNTER — Encounter (HOSPITAL_COMMUNITY)
Admission: RE | Admit: 2015-11-09 | Discharge: 2015-11-09 | Disposition: A | Discharge status: Home / Self Care | Payer: Medicare Other | Source: Ambulatory Visit | Attending: Rheumatology | Admitting: Rheumatology

## 2015-11-09 DIAGNOSIS — M79672 Pain in left foot: Secondary | ICD-10-CM | POA: Diagnosis not present

## 2015-11-09 DIAGNOSIS — R948 Abnormal results of function studies of other organs and systems: Secondary | ICD-10-CM | POA: Diagnosis not present

## 2015-11-09 MED ORDER — TECHNETIUM TC 99M MEDRONATE IV KIT
25.0000 | PACK | Freq: Once | INTRAVENOUS | Status: AC | PRN
  Administered 2015-11-09: 25 via INTRAVENOUS

## 2015-11-16 DIAGNOSIS — M25561 Pain in right knee: Secondary | ICD-10-CM | POA: Diagnosis not present

## 2015-11-16 DIAGNOSIS — F419 Anxiety disorder, unspecified: Secondary | ICD-10-CM | POA: Diagnosis not present

## 2015-11-16 DIAGNOSIS — M5136 Other intervertebral disc degeneration, lumbar region: Secondary | ICD-10-CM | POA: Diagnosis not present

## 2015-11-16 DIAGNOSIS — G894 Chronic pain syndrome: Secondary | ICD-10-CM | POA: Diagnosis not present

## 2015-11-16 DIAGNOSIS — Z96652 Presence of left artificial knee joint: Secondary | ICD-10-CM | POA: Diagnosis not present

## 2015-11-16 DIAGNOSIS — M5416 Radiculopathy, lumbar region: Secondary | ICD-10-CM | POA: Diagnosis not present

## 2015-11-16 DIAGNOSIS — G2581 Restless legs syndrome: Secondary | ICD-10-CM | POA: Diagnosis not present

## 2015-11-16 DIAGNOSIS — Z1389 Encounter for screening for other disorder: Secondary | ICD-10-CM | POA: Diagnosis not present

## 2015-11-16 DIAGNOSIS — Z96651 Presence of right artificial knee joint: Secondary | ICD-10-CM | POA: Diagnosis not present

## 2015-11-16 DIAGNOSIS — M961 Postlaminectomy syndrome, not elsewhere classified: Secondary | ICD-10-CM | POA: Diagnosis not present

## 2015-11-17 ENCOUNTER — Other Ambulatory Visit: Payer: Self-pay | Admitting: Rheumatology

## 2015-11-17 DIAGNOSIS — M79672 Pain in left foot: Secondary | ICD-10-CM

## 2015-11-19 DIAGNOSIS — M84375A Stress fracture, left foot, initial encounter for fracture: Secondary | ICD-10-CM | POA: Diagnosis not present

## 2015-11-21 DIAGNOSIS — F3341 Major depressive disorder, recurrent, in partial remission: Secondary | ICD-10-CM | POA: Diagnosis not present

## 2015-11-23 ENCOUNTER — Other Ambulatory Visit: Payer: Medicare Other

## 2015-11-30 DIAGNOSIS — G2581 Restless legs syndrome: Secondary | ICD-10-CM | POA: Diagnosis not present

## 2015-11-30 DIAGNOSIS — E669 Obesity, unspecified: Secondary | ICD-10-CM | POA: Diagnosis not present

## 2015-11-30 DIAGNOSIS — Z136 Encounter for screening for cardiovascular disorders: Secondary | ICD-10-CM | POA: Diagnosis not present

## 2015-11-30 DIAGNOSIS — K219 Gastro-esophageal reflux disease without esophagitis: Secondary | ICD-10-CM | POA: Diagnosis not present

## 2015-11-30 DIAGNOSIS — Z79899 Other long term (current) drug therapy: Secondary | ICD-10-CM | POA: Diagnosis not present

## 2015-11-30 DIAGNOSIS — Z9181 History of falling: Secondary | ICD-10-CM | POA: Diagnosis not present

## 2015-11-30 DIAGNOSIS — Z1239 Encounter for other screening for malignant neoplasm of breast: Secondary | ICD-10-CM | POA: Diagnosis not present

## 2015-11-30 DIAGNOSIS — Z1211 Encounter for screening for malignant neoplasm of colon: Secondary | ICD-10-CM | POA: Diagnosis not present

## 2015-11-30 DIAGNOSIS — Z6832 Body mass index (BMI) 32.0-32.9, adult: Secondary | ICD-10-CM | POA: Diagnosis not present

## 2015-11-30 DIAGNOSIS — E785 Hyperlipidemia, unspecified: Secondary | ICD-10-CM | POA: Diagnosis not present

## 2015-11-30 DIAGNOSIS — M1990 Unspecified osteoarthritis, unspecified site: Secondary | ICD-10-CM | POA: Diagnosis not present

## 2015-11-30 DIAGNOSIS — Z0000 Encounter for general adult medical examination without abnormal findings: Secondary | ICD-10-CM | POA: Diagnosis not present

## 2015-11-30 DIAGNOSIS — F331 Major depressive disorder, recurrent, moderate: Secondary | ICD-10-CM | POA: Diagnosis not present

## 2015-12-16 DIAGNOSIS — Z1231 Encounter for screening mammogram for malignant neoplasm of breast: Secondary | ICD-10-CM | POA: Diagnosis not present

## 2015-12-19 DIAGNOSIS — N3090 Cystitis, unspecified without hematuria: Secondary | ICD-10-CM | POA: Diagnosis not present

## 2015-12-19 DIAGNOSIS — N390 Urinary tract infection, site not specified: Secondary | ICD-10-CM | POA: Diagnosis not present

## 2015-12-19 DIAGNOSIS — N952 Postmenopausal atrophic vaginitis: Secondary | ICD-10-CM | POA: Diagnosis not present

## 2015-12-21 DIAGNOSIS — M25561 Pain in right knee: Secondary | ICD-10-CM | POA: Diagnosis not present

## 2015-12-21 DIAGNOSIS — F419 Anxiety disorder, unspecified: Secondary | ICD-10-CM | POA: Diagnosis not present

## 2015-12-21 DIAGNOSIS — G894 Chronic pain syndrome: Secondary | ICD-10-CM | POA: Diagnosis not present

## 2015-12-21 DIAGNOSIS — Z96651 Presence of right artificial knee joint: Secondary | ICD-10-CM | POA: Diagnosis not present

## 2015-12-21 DIAGNOSIS — M961 Postlaminectomy syndrome, not elsewhere classified: Secondary | ICD-10-CM | POA: Diagnosis not present

## 2015-12-21 DIAGNOSIS — Z96652 Presence of left artificial knee joint: Secondary | ICD-10-CM | POA: Diagnosis not present

## 2015-12-21 DIAGNOSIS — M5416 Radiculopathy, lumbar region: Secondary | ICD-10-CM | POA: Diagnosis not present

## 2015-12-21 DIAGNOSIS — M5136 Other intervertebral disc degeneration, lumbar region: Secondary | ICD-10-CM | POA: Diagnosis not present

## 2015-12-21 DIAGNOSIS — G2581 Restless legs syndrome: Secondary | ICD-10-CM | POA: Diagnosis not present

## 2016-01-11 DIAGNOSIS — N182 Chronic kidney disease, stage 2 (mild): Secondary | ICD-10-CM | POA: Diagnosis not present

## 2016-01-18 DIAGNOSIS — F3341 Major depressive disorder, recurrent, in partial remission: Secondary | ICD-10-CM | POA: Diagnosis not present

## 2016-01-25 DIAGNOSIS — G894 Chronic pain syndrome: Secondary | ICD-10-CM | POA: Diagnosis not present

## 2016-01-25 DIAGNOSIS — M25561 Pain in right knee: Secondary | ICD-10-CM | POA: Diagnosis not present

## 2016-01-25 DIAGNOSIS — G2581 Restless legs syndrome: Secondary | ICD-10-CM | POA: Diagnosis not present

## 2016-01-25 DIAGNOSIS — Z96651 Presence of right artificial knee joint: Secondary | ICD-10-CM | POA: Diagnosis not present

## 2016-01-25 DIAGNOSIS — M961 Postlaminectomy syndrome, not elsewhere classified: Secondary | ICD-10-CM | POA: Diagnosis not present

## 2016-01-25 DIAGNOSIS — M5136 Other intervertebral disc degeneration, lumbar region: Secondary | ICD-10-CM | POA: Diagnosis not present

## 2016-01-25 DIAGNOSIS — Z79891 Long term (current) use of opiate analgesic: Secondary | ICD-10-CM | POA: Diagnosis not present

## 2016-01-25 DIAGNOSIS — M5416 Radiculopathy, lumbar region: Secondary | ICD-10-CM | POA: Diagnosis not present

## 2016-01-25 DIAGNOSIS — Z96652 Presence of left artificial knee joint: Secondary | ICD-10-CM | POA: Diagnosis not present

## 2016-01-25 DIAGNOSIS — F419 Anxiety disorder, unspecified: Secondary | ICD-10-CM | POA: Diagnosis not present

## 2016-02-21 DIAGNOSIS — N390 Urinary tract infection, site not specified: Secondary | ICD-10-CM | POA: Diagnosis not present

## 2016-02-23 DIAGNOSIS — H04123 Dry eye syndrome of bilateral lacrimal glands: Secondary | ICD-10-CM | POA: Diagnosis not present

## 2016-02-23 DIAGNOSIS — Z961 Presence of intraocular lens: Secondary | ICD-10-CM | POA: Diagnosis not present

## 2016-02-23 DIAGNOSIS — H35363 Drusen (degenerative) of macula, bilateral: Secondary | ICD-10-CM | POA: Diagnosis not present

## 2016-02-27 DIAGNOSIS — M5416 Radiculopathy, lumbar region: Secondary | ICD-10-CM | POA: Diagnosis not present

## 2016-02-27 DIAGNOSIS — G894 Chronic pain syndrome: Secondary | ICD-10-CM | POA: Diagnosis not present

## 2016-02-27 DIAGNOSIS — Z96652 Presence of left artificial knee joint: Secondary | ICD-10-CM | POA: Diagnosis not present

## 2016-02-27 DIAGNOSIS — G2581 Restless legs syndrome: Secondary | ICD-10-CM | POA: Diagnosis not present

## 2016-02-27 DIAGNOSIS — M961 Postlaminectomy syndrome, not elsewhere classified: Secondary | ICD-10-CM | POA: Diagnosis not present

## 2016-02-27 DIAGNOSIS — Z96651 Presence of right artificial knee joint: Secondary | ICD-10-CM | POA: Diagnosis not present

## 2016-02-27 DIAGNOSIS — M5136 Other intervertebral disc degeneration, lumbar region: Secondary | ICD-10-CM | POA: Diagnosis not present

## 2016-02-27 DIAGNOSIS — M25561 Pain in right knee: Secondary | ICD-10-CM | POA: Diagnosis not present

## 2016-02-27 DIAGNOSIS — F419 Anxiety disorder, unspecified: Secondary | ICD-10-CM | POA: Diagnosis not present

## 2016-02-27 DIAGNOSIS — Z79891 Long term (current) use of opiate analgesic: Secondary | ICD-10-CM | POA: Diagnosis not present

## 2016-03-08 DIAGNOSIS — B078 Other viral warts: Secondary | ICD-10-CM | POA: Diagnosis not present

## 2016-03-08 DIAGNOSIS — Z23 Encounter for immunization: Secondary | ICD-10-CM | POA: Diagnosis not present

## 2016-03-08 DIAGNOSIS — M79644 Pain in right finger(s): Secondary | ICD-10-CM | POA: Diagnosis not present

## 2016-03-19 DIAGNOSIS — F3341 Major depressive disorder, recurrent, in partial remission: Secondary | ICD-10-CM | POA: Diagnosis not present

## 2016-03-20 DIAGNOSIS — N3090 Cystitis, unspecified without hematuria: Secondary | ICD-10-CM | POA: Diagnosis not present

## 2016-03-20 DIAGNOSIS — N952 Postmenopausal atrophic vaginitis: Secondary | ICD-10-CM | POA: Diagnosis not present

## 2016-03-21 DIAGNOSIS — J22 Unspecified acute lower respiratory infection: Secondary | ICD-10-CM | POA: Diagnosis not present

## 2016-03-28 DIAGNOSIS — M25561 Pain in right knee: Secondary | ICD-10-CM | POA: Diagnosis not present

## 2016-03-28 DIAGNOSIS — G2581 Restless legs syndrome: Secondary | ICD-10-CM | POA: Diagnosis not present

## 2016-03-28 DIAGNOSIS — Z96652 Presence of left artificial knee joint: Secondary | ICD-10-CM | POA: Diagnosis not present

## 2016-03-28 DIAGNOSIS — M961 Postlaminectomy syndrome, not elsewhere classified: Secondary | ICD-10-CM | POA: Diagnosis not present

## 2016-03-28 DIAGNOSIS — Z96651 Presence of right artificial knee joint: Secondary | ICD-10-CM | POA: Diagnosis not present

## 2016-03-28 DIAGNOSIS — G894 Chronic pain syndrome: Secondary | ICD-10-CM | POA: Diagnosis not present

## 2016-03-28 DIAGNOSIS — M5416 Radiculopathy, lumbar region: Secondary | ICD-10-CM | POA: Diagnosis not present

## 2016-03-28 DIAGNOSIS — M5136 Other intervertebral disc degeneration, lumbar region: Secondary | ICD-10-CM | POA: Diagnosis not present

## 2016-03-28 DIAGNOSIS — Z79891 Long term (current) use of opiate analgesic: Secondary | ICD-10-CM | POA: Diagnosis not present

## 2016-03-28 DIAGNOSIS — F419 Anxiety disorder, unspecified: Secondary | ICD-10-CM | POA: Diagnosis not present

## 2016-04-03 DIAGNOSIS — Z87898 Personal history of other specified conditions: Secondary | ICD-10-CM | POA: Diagnosis not present

## 2016-04-03 DIAGNOSIS — B079 Viral wart, unspecified: Secondary | ICD-10-CM | POA: Diagnosis not present

## 2016-04-03 DIAGNOSIS — R7303 Prediabetes: Secondary | ICD-10-CM | POA: Diagnosis not present

## 2016-04-03 DIAGNOSIS — K219 Gastro-esophageal reflux disease without esophagitis: Secondary | ICD-10-CM | POA: Diagnosis not present

## 2016-04-03 DIAGNOSIS — M1990 Unspecified osteoarthritis, unspecified site: Secondary | ICD-10-CM | POA: Diagnosis not present

## 2016-04-03 DIAGNOSIS — Z79899 Other long term (current) drug therapy: Secondary | ICD-10-CM | POA: Diagnosis not present

## 2016-04-03 DIAGNOSIS — F3342 Major depressive disorder, recurrent, in full remission: Secondary | ICD-10-CM | POA: Diagnosis not present

## 2016-04-03 DIAGNOSIS — G2581 Restless legs syndrome: Secondary | ICD-10-CM | POA: Diagnosis not present

## 2016-04-03 DIAGNOSIS — E785 Hyperlipidemia, unspecified: Secondary | ICD-10-CM | POA: Diagnosis not present

## 2016-04-18 DIAGNOSIS — M5136 Other intervertebral disc degeneration, lumbar region: Secondary | ICD-10-CM | POA: Diagnosis not present

## 2016-05-09 DIAGNOSIS — M5136 Other intervertebral disc degeneration, lumbar region: Secondary | ICD-10-CM | POA: Diagnosis not present

## 2016-05-24 DIAGNOSIS — M25561 Pain in right knee: Secondary | ICD-10-CM | POA: Diagnosis not present

## 2016-05-24 DIAGNOSIS — G2581 Restless legs syndrome: Secondary | ICD-10-CM | POA: Diagnosis not present

## 2016-05-24 DIAGNOSIS — F419 Anxiety disorder, unspecified: Secondary | ICD-10-CM | POA: Diagnosis not present

## 2016-05-24 DIAGNOSIS — G894 Chronic pain syndrome: Secondary | ICD-10-CM | POA: Diagnosis not present

## 2016-05-24 DIAGNOSIS — Z96652 Presence of left artificial knee joint: Secondary | ICD-10-CM | POA: Diagnosis not present

## 2016-05-24 DIAGNOSIS — Z96651 Presence of right artificial knee joint: Secondary | ICD-10-CM | POA: Diagnosis not present

## 2016-05-24 DIAGNOSIS — M961 Postlaminectomy syndrome, not elsewhere classified: Secondary | ICD-10-CM | POA: Diagnosis not present

## 2016-05-24 DIAGNOSIS — Z79891 Long term (current) use of opiate analgesic: Secondary | ICD-10-CM | POA: Diagnosis not present

## 2016-05-24 DIAGNOSIS — M5136 Other intervertebral disc degeneration, lumbar region: Secondary | ICD-10-CM | POA: Diagnosis not present

## 2016-05-24 DIAGNOSIS — M5416 Radiculopathy, lumbar region: Secondary | ICD-10-CM | POA: Diagnosis not present

## 2016-06-06 DIAGNOSIS — M47816 Spondylosis without myelopathy or radiculopathy, lumbar region: Secondary | ICD-10-CM | POA: Diagnosis not present

## 2016-07-05 DIAGNOSIS — G894 Chronic pain syndrome: Secondary | ICD-10-CM | POA: Diagnosis not present

## 2016-07-05 DIAGNOSIS — F419 Anxiety disorder, unspecified: Secondary | ICD-10-CM | POA: Diagnosis not present

## 2016-07-05 DIAGNOSIS — Z79891 Long term (current) use of opiate analgesic: Secondary | ICD-10-CM | POA: Diagnosis not present

## 2016-07-05 DIAGNOSIS — M961 Postlaminectomy syndrome, not elsewhere classified: Secondary | ICD-10-CM | POA: Diagnosis not present

## 2016-07-05 DIAGNOSIS — Z96652 Presence of left artificial knee joint: Secondary | ICD-10-CM | POA: Diagnosis not present

## 2016-07-05 DIAGNOSIS — Z96651 Presence of right artificial knee joint: Secondary | ICD-10-CM | POA: Diagnosis not present

## 2016-07-05 DIAGNOSIS — M5136 Other intervertebral disc degeneration, lumbar region: Secondary | ICD-10-CM | POA: Diagnosis not present

## 2016-07-05 DIAGNOSIS — M5416 Radiculopathy, lumbar region: Secondary | ICD-10-CM | POA: Diagnosis not present

## 2016-07-05 DIAGNOSIS — G2581 Restless legs syndrome: Secondary | ICD-10-CM | POA: Diagnosis not present

## 2016-07-05 DIAGNOSIS — M25561 Pain in right knee: Secondary | ICD-10-CM | POA: Diagnosis not present

## 2016-07-09 DIAGNOSIS — F331 Major depressive disorder, recurrent, moderate: Secondary | ICD-10-CM | POA: Diagnosis not present

## 2016-07-24 DIAGNOSIS — M545 Low back pain: Secondary | ICD-10-CM | POA: Diagnosis not present

## 2016-07-24 DIAGNOSIS — M961 Postlaminectomy syndrome, not elsewhere classified: Secondary | ICD-10-CM | POA: Diagnosis not present

## 2016-08-15 DIAGNOSIS — M1990 Unspecified osteoarthritis, unspecified site: Secondary | ICD-10-CM | POA: Diagnosis not present

## 2016-08-15 DIAGNOSIS — E785 Hyperlipidemia, unspecified: Secondary | ICD-10-CM | POA: Diagnosis not present

## 2016-08-15 DIAGNOSIS — Z79899 Other long term (current) drug therapy: Secondary | ICD-10-CM | POA: Diagnosis not present

## 2016-08-15 DIAGNOSIS — F331 Major depressive disorder, recurrent, moderate: Secondary | ICD-10-CM | POA: Diagnosis not present

## 2016-08-15 DIAGNOSIS — K219 Gastro-esophageal reflux disease without esophagitis: Secondary | ICD-10-CM | POA: Diagnosis not present

## 2016-08-21 ENCOUNTER — Encounter: Payer: Self-pay | Admitting: Psychology

## 2016-08-21 ENCOUNTER — Encounter: Payer: Medicare Other | Attending: Psychology | Admitting: Psychology

## 2016-08-21 DIAGNOSIS — G629 Polyneuropathy, unspecified: Secondary | ICD-10-CM | POA: Insufficient documentation

## 2016-08-21 DIAGNOSIS — Z86718 Personal history of other venous thrombosis and embolism: Secondary | ICD-10-CM | POA: Insufficient documentation

## 2016-08-21 DIAGNOSIS — G894 Chronic pain syndrome: Secondary | ICD-10-CM | POA: Insufficient documentation

## 2016-08-21 DIAGNOSIS — M722 Plantar fascial fibromatosis: Secondary | ICD-10-CM | POA: Insufficient documentation

## 2016-08-21 DIAGNOSIS — M419 Scoliosis, unspecified: Secondary | ICD-10-CM | POA: Insufficient documentation

## 2016-08-21 DIAGNOSIS — G2581 Restless legs syndrome: Secondary | ICD-10-CM | POA: Insufficient documentation

## 2016-08-21 DIAGNOSIS — G709 Myoneural disorder, unspecified: Secondary | ICD-10-CM | POA: Diagnosis not present

## 2016-08-21 DIAGNOSIS — F411 Generalized anxiety disorder: Secondary | ICD-10-CM | POA: Diagnosis not present

## 2016-08-21 DIAGNOSIS — M1990 Unspecified osteoarthritis, unspecified site: Secondary | ICD-10-CM | POA: Diagnosis not present

## 2016-08-21 DIAGNOSIS — I739 Peripheral vascular disease, unspecified: Secondary | ICD-10-CM | POA: Insufficient documentation

## 2016-08-21 DIAGNOSIS — K219 Gastro-esophageal reflux disease without esophagitis: Secondary | ICD-10-CM | POA: Diagnosis not present

## 2016-08-21 DIAGNOSIS — G8929 Other chronic pain: Secondary | ICD-10-CM | POA: Diagnosis present

## 2016-08-21 DIAGNOSIS — Z86711 Personal history of pulmonary embolism: Secondary | ICD-10-CM | POA: Insufficient documentation

## 2016-08-21 DIAGNOSIS — R569 Unspecified convulsions: Secondary | ICD-10-CM | POA: Diagnosis not present

## 2016-08-21 DIAGNOSIS — F329 Major depressive disorder, single episode, unspecified: Secondary | ICD-10-CM | POA: Insufficient documentation

## 2016-08-21 NOTE — Progress Notes (Signed)
Neuropsychological Consultation   Patient:   Carrie Keller   DOB:   February 16, 1946  MR Number:  [id number]  Location:  [facility] [address], [place] [id number] [place] [address] Dept: [phone]           Date of Service:   08/22/2006  Start Time:   9 AM End Time:   10 AM  Provider/Observer:  Ilean Skill, Psy.D.       Clinical Neuropsychologist       Billing Code/Service: Psychological diagnostic evaluation  Chief Complaint:    The patient deals with chronic pain due to numerous orthopedic/neurological issues with her spine. The patient has had 2 prior surgeries but continues to experience significant pain. The only option she has going forward would be further hardware related to a rod and the patient is to avoid that if at all possible. The patient reports that she experiences pain all of the time except for a brief period when she first wakes up of about 30 minutes.  Reason for Service:  The patient is a 71 year old Caucasian female that was referred for psychological evaluation as part of the initial assessment for possible pre-implant trialing and/or implantation of a spinal cord stimulator.  The patient reports that she experiences pain most of the time. However, she does also have some other complications and difficulties that she is dealing with. The patient is dealing with chronic insomnia and anxiety that is likely related to  Insomnia as well as excessive worrying and fatigue. The patient is the primary caregiver for her husband who has advanced dementia. His dementia appears to be due to multi-infarct dementia and has had multiple mini strokes. She cares for and 24 7 although he does go to a care center each day for some time where she can take care of some of the household items there. The patient reports that she feels like she has little to no life is a  combination of what is going on with her husband as well as her chronic pain. She reports admits that this does increase symptoms of depression and frustration, irritability, and reduced patience. However, she does have a plan for his care if she does go through with any type of surgical implantation surgery.  The patient  takes a number of medications. She takes alprazolam as needed for anxiety and sleep. She also takes amitriptyline at night for sleep as well as trazodone. The patient takes oxycodone with acetaminophen during the day or pain. She also takes Robaxin for muscle spasms and hydrocodone as well. Other medications can be found in the patient's medical records.  Current Status:  The patient describes some degree of anxiety and worry as well as sleep disturbance. Her primary feature is chronic pain related to her degenerative back issues and multiple surgical interventions.  Reliability of Information: Information is provided by the patient as well as review of available medical records.  Behavioral Observation: Carrie Keller  presents as a 71 y.o.-year-old Right Caucasian Female who appeared her stated age. her dress was Appropriate and she was Well Groomed and her manners were Appropriate to the situation.  her participation was indicative of Appropriate and Attentive behaviors.  There were any physical disabilities noted.  she displayed an appropriate level of cooperation and motivation.     Interactions:    Active Appropriate and Attentive  Attention:   within normal limits and attention span  and concentration were age appropriate  Memory:   within normal limits; recent and remote memory intact  Visuo-spatial:  within normal limits  Speech (Volume):  normal  Speech:   normal;   Thought Process:  Coherent and Relevant  Though Content:  WNL; not suicidal  Orientation:   person, place, time/date and  situation  Judgment:   Good  Planning:   Good  Affect:    Appropriate  Mood:    Anxious  Insight:   Good  Intelligence:   high  Marital Status/Living: The patient was born in [place] and grew up in [place]. She is married and they have 63-year-old daughter. The patient sounds been is suffering from severe late stage dementia likely related to multi-infarct dementia. She is the primary caregiver of her husband but there is a time during each day working goes to a care center for her to have time to take care of household issues with him not around.  Current Employment: The patient is retired.  Past Employment:  The patient worked for [employer] between 1977 2000  Substance Use:  No concerns of substance abuse are reported.  The patient has taken medications for many years including pain medications as well as anxiolytics/benzo diazepam. The patient denies having trouble with abuse of these medications. She acknowledges having wine infrequently.  Education:   Engineering geologist History:   Past Medical History:  Diagnosis Date  . Anxiety    sees Dr Jerl Santos  psychiatrist every 3 month  . Arthritis   . Complication of anesthesia   . Depression   . DVT (deep venous thrombosis) 2008   RIGHT /POST OP  . GERD (gastroesophageal reflux disease)   . H/O hiatal hernia   . Heart murmur 1970   MVP/ asympotmatic  . Hyperlipidemia   . 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 Neuropathy    BOTH FEET AND LEGS-WORSE ON LEFT SIDE  . Pain    LOWER BACK WITH PROLONGED BENDING OVER AND STANDING- S/P SPINAL FUSION JAN 2015.  Marland Kitchen Peripheral vascular disease    venous insufficiency/ bilateral legs  . Plantar fasciitis    LEFT  . PONV (postoperative nausea and vomiting)    NO PROBLEMS WITH N&V LAST COUPLE OF SURGERIES  . Pulmonary embolism 2008  . Restless leg syndrome   . Scoliosis   . Seizures    last seizure 1967- states  reaction to COMPAZINE  . Shortness of breath    WITH EXERTION           Abuse/Trauma History: The patient is going through a rather stressful time right now have to take care of her husband who has severe late onset dementia.  Psychiatric History:  The patient does have a history of anxiety and depression most notably depression. She reports that these really developed in the 1990s and she has continued to be followed by psychiatrist for her care.  Family Med/Psych History: No family history on file.  Risk of Suicide/Violence: virtually non-existent the patient denies any suicidal or homicidal ideation.  Impression/DX:  The patient is a 71 year old Caucasian female referred for psychological evaluation as part of the standard assessment for consideration for spinal cord stimulator trial. The patient does have a history of some anxiety and she does have a lot of stress going on with the medical issues going on with her husband related to multi-infarct dementia and the fact that she is  the primary caregiver. She does have a plan in place and support if she were to have any type of surgical intervention. The patient appears to be well managing her anxiety and does have ongoing psychiatric care. The patient is dealing with chronic pain syndrome. She deals with pain every day.  Disposition/Plan:  The patient will complete the [place] multiphasic personality inventory-II as well as the P3 patient pain profile inventory and the formal interpretation of results will be provided as part of this evaluation.  Diagnosis:    Chronic pain syndrome  Generalized anxiety disorder         Electronically Signed   _______________________ Ilean Skill, Psy.D.

## 2016-08-23 ENCOUNTER — Encounter (HOSPITAL_BASED_OUTPATIENT_CLINIC_OR_DEPARTMENT_OTHER): Payer: Medicare Other | Admitting: Psychology

## 2016-08-23 DIAGNOSIS — M1990 Unspecified osteoarthritis, unspecified site: Secondary | ICD-10-CM | POA: Diagnosis not present

## 2016-08-23 DIAGNOSIS — K219 Gastro-esophageal reflux disease without esophagitis: Secondary | ICD-10-CM | POA: Diagnosis not present

## 2016-08-23 DIAGNOSIS — F411 Generalized anxiety disorder: Secondary | ICD-10-CM | POA: Diagnosis not present

## 2016-08-23 DIAGNOSIS — G894 Chronic pain syndrome: Secondary | ICD-10-CM

## 2016-08-23 DIAGNOSIS — G709 Myoneural disorder, unspecified: Secondary | ICD-10-CM | POA: Diagnosis not present

## 2016-08-23 DIAGNOSIS — F329 Major depressive disorder, single episode, unspecified: Secondary | ICD-10-CM | POA: Diagnosis not present

## 2016-08-23 NOTE — Progress Notes (Addendum)
Patient:  Carrie Keller   DOB: 07-04-1945  MR Number: [id number]  Location: [facility] [address] [place] [address] Dept: [phone]  Start: 8 AM End: 9 AM  Provider/Observer:     Edgardo Roys PSYD  Chief Complaint:      Chief Complaint  Patient presents with  . Pain    Reason For Service:    The patient is a 71 year old Caucasian female that was referred for psychological evaluation as part of the initial assessment for possible pre-implant trialing and/or implantation of a spinal cord stimulator.  The patient reports that she experiences pain most of the time. However, she does also have some other complications and difficulties that she is dealing with. The patient is dealing with chronic insomnia and anxiety that is likely related to  Insomnia as well as excessive worrying and fatigue. The patient is the primary caregiver for her husband who has advanced dementia. His dementia appears to be due to multi-infarct dementia and has had multiple mini strokes. She cares for him 24 7 although he does go to a care center each day for some time where she can take care of some of the household items there. The patient reports that she feels like she has little to no life is a combination of what is going on with her husband as well as her chronic pain. She reports admits that this does increase symptoms of depression and frustration, irritability, and reduced patience. However, she does have a plan for his care if she does go through with any type of surgical implantation surgery.  The patient  takes a number of medications. She takes alprazolam as needed for anxiety and sleep. She also takes amitriptyline at night for sleep as well as trazodone. The patient takes oxycodone with acetaminophen during the day or pain. She also takes Robaxin for muscle spasms and  hydrocodone as well. Other medications can be found in the patient's medical records.  Testing Administered:  The patient completed the [place] multiphasic personality inventory-2 as well as the patient pain profile (P3)  Participation Level:   Active  Participation Quality:  Appropriate and Attentive      Behavioral Observation:  Well Groomed, Alert, and Appropriate.   Test Results:   Initially, the patient completed the [place] multiphasic personality inventory. Validity scales suggest that the patient approached this measure in an honest and straightforward manner neither trying to exaggerate or minimize any particular areas of difficulties or symptoms. Therefore, the results of this objective psychological assessment appear to be valid and interpretable.  The patient's assessment produces a basic clinical scales profile in which she does not have any significant clinically relevant variables. The patient does have some specific both vague in specific physical concerns in medical issues as well as some elevation but within clinical limits with regard to depression and anxiety types of symptoms. However, neither of these are in the severe or clinically significant range.  Further analysis utilized content and supplemental scales do further identify issues and concerns related to specific elements of anxiety as being problematic for her as well as specific health concerns. Both of these are in the clinically significant range and are consistent with the patient's descriptions. The patient also had an elevation on content scales related to depression. Further analysis utilizing supplemental scales also identifies some difficulties with adjustment and anxiety relative to her specific/immediate situation.  The patient does have some very mild clinically significant elevations on both of the PTSD scales but these are very mild elevations in likely elevated because of the anxiety and stress she is  right now with dealing with her husband who is suffering from ongoing and progressive dementia. Other scales of the MMPI highlight difficulty she is having with physical malfunction and physical distress as well as a lack of energy and fatigue. The patient is showing no elevations on elements related to either psychotic or significant personality variables that would be significantly disrupted. She is acknowledging difficulty managing her emotional responses to the current stressors she is having relative to her husband.  The patient also completed the patient pain profile (P3). This is a specific measure that was developed and normal both the community sample as well as a pain patient sample. The patient's profile is consistent and right at the median level for pain patient profile. She does have an elevated level of depression and anxiety symptoms and somatic complaints relative to a community sample of individuals without chronic pain syndromes. However, her descriptions of her symptoms are consistent with those typically found with pain patience of a chronic nature.  Summary of Results:   Results of the current neuropsychological/psychological evaluation are consistent with the patient's descriptions of her chronic pain syndrome and the negative effect it is having on her life. However, while the patient is having difficulty from a psychosocial perspective regarding the progressive dementia (multi-infarct dementia) of her husband and this is creating an increased level of anxiety and stress, she is managing and handling the stressors and difficulties in a reasonable way. She does not appear to be overwhelmed and unable to cope with these difficulties but having an appropriate emotional response to her difficulties. While there are issues related to anxiety and depression none of these are severe enough to be negatively impacting her potential to get an appropriate trialing response from a spinal cord  stimulator or impairing her ability to comply with the needs for her to follow certain protocols once this implantation is done. There were no indications of the MMPI scales are associated with significant difficulty or issues having to do with substance abuse and she does not appear to be vulnerable to overuse or abuse of responses to various medications.   Impression/Diagnosis:   The results of the current psychological evaluation do suggest that the patient would be a very good/excellent candidate for spinal cord stimulator trial. There were no significant psychiatric/psychological issues that would leave her unable to effectively go through the trialing process and potentially final implantation of the device. The patient does have some stressful psychosocial issues most notably with regard to her husband's multi-infarct dementia. However, she does appear to be managing this very stressful situation in an adequate way and not becoming overwhelmed by this situation to a point that she would not be able to do what she needs to do with regard to spinal cord stimulator trials. They do have assistance each day for her husband and therefore she does not have the sole responsibility. However, it would be important to make sure that these responsibilities and efforts that she will continue to have with regard to her husband after adequate coverage during the operation and recovery periods.  Diagnosis:    Axis I: Chronic pain syndrome  Generalized anxiety disorder   Ilean Skill, Psy.D. Neuropsychologist      +

## 2016-08-30 ENCOUNTER — Ambulatory Visit: Payer: Medicare Other | Admitting: Psychology

## 2016-09-10 ENCOUNTER — Telehealth: Payer: Self-pay | Admitting: *Deleted

## 2016-09-10 NOTE — Telephone Encounter (Signed)
Jaydn is waiting on a call back after testing done by Dr Sima Matas 2 weeks ago

## 2016-09-12 DIAGNOSIS — M545 Low back pain: Secondary | ICD-10-CM | POA: Diagnosis not present

## 2016-09-12 DIAGNOSIS — R300 Dysuria: Secondary | ICD-10-CM | POA: Diagnosis not present

## 2016-09-13 ENCOUNTER — Encounter: Payer: Medicare Other | Attending: Psychology | Admitting: Psychology

## 2016-09-13 DIAGNOSIS — G709 Myoneural disorder, unspecified: Secondary | ICD-10-CM | POA: Insufficient documentation

## 2016-09-13 DIAGNOSIS — M722 Plantar fascial fibromatosis: Secondary | ICD-10-CM | POA: Insufficient documentation

## 2016-09-13 DIAGNOSIS — G2581 Restless legs syndrome: Secondary | ICD-10-CM | POA: Diagnosis not present

## 2016-09-13 DIAGNOSIS — Z86718 Personal history of other venous thrombosis and embolism: Secondary | ICD-10-CM | POA: Diagnosis not present

## 2016-09-13 DIAGNOSIS — F411 Generalized anxiety disorder: Secondary | ICD-10-CM | POA: Diagnosis not present

## 2016-09-13 DIAGNOSIS — I739 Peripheral vascular disease, unspecified: Secondary | ICD-10-CM | POA: Diagnosis not present

## 2016-09-13 DIAGNOSIS — M1990 Unspecified osteoarthritis, unspecified site: Secondary | ICD-10-CM | POA: Insufficient documentation

## 2016-09-13 DIAGNOSIS — G629 Polyneuropathy, unspecified: Secondary | ICD-10-CM | POA: Diagnosis not present

## 2016-09-13 DIAGNOSIS — G894 Chronic pain syndrome: Secondary | ICD-10-CM | POA: Diagnosis not present

## 2016-09-13 DIAGNOSIS — R569 Unspecified convulsions: Secondary | ICD-10-CM | POA: Insufficient documentation

## 2016-09-13 DIAGNOSIS — K219 Gastro-esophageal reflux disease without esophagitis: Secondary | ICD-10-CM | POA: Diagnosis not present

## 2016-09-13 DIAGNOSIS — F329 Major depressive disorder, single episode, unspecified: Secondary | ICD-10-CM | POA: Insufficient documentation

## 2016-09-13 DIAGNOSIS — M419 Scoliosis, unspecified: Secondary | ICD-10-CM | POA: Insufficient documentation

## 2016-09-13 DIAGNOSIS — G8929 Other chronic pain: Secondary | ICD-10-CM | POA: Diagnosis present

## 2016-09-13 DIAGNOSIS — Z86711 Personal history of pulmonary embolism: Secondary | ICD-10-CM | POA: Insufficient documentation

## 2016-09-18 DIAGNOSIS — F419 Anxiety disorder, unspecified: Secondary | ICD-10-CM | POA: Diagnosis not present

## 2016-09-18 DIAGNOSIS — M5416 Radiculopathy, lumbar region: Secondary | ICD-10-CM | POA: Diagnosis not present

## 2016-09-18 DIAGNOSIS — M25561 Pain in right knee: Secondary | ICD-10-CM | POA: Diagnosis not present

## 2016-09-18 DIAGNOSIS — Z96651 Presence of right artificial knee joint: Secondary | ICD-10-CM | POA: Diagnosis not present

## 2016-09-18 DIAGNOSIS — Z96652 Presence of left artificial knee joint: Secondary | ICD-10-CM | POA: Diagnosis not present

## 2016-09-18 DIAGNOSIS — G894 Chronic pain syndrome: Secondary | ICD-10-CM | POA: Diagnosis not present

## 2016-09-18 DIAGNOSIS — Z79891 Long term (current) use of opiate analgesic: Secondary | ICD-10-CM | POA: Diagnosis not present

## 2016-09-18 DIAGNOSIS — G2581 Restless legs syndrome: Secondary | ICD-10-CM | POA: Diagnosis not present

## 2016-09-18 DIAGNOSIS — M961 Postlaminectomy syndrome, not elsewhere classified: Secondary | ICD-10-CM | POA: Diagnosis not present

## 2016-09-18 DIAGNOSIS — M5136 Other intervertebral disc degeneration, lumbar region: Secondary | ICD-10-CM | POA: Diagnosis not present

## 2016-10-04 DIAGNOSIS — F331 Major depressive disorder, recurrent, moderate: Secondary | ICD-10-CM | POA: Diagnosis not present

## 2016-10-08 ENCOUNTER — Encounter: Payer: Self-pay | Admitting: Psychology

## 2016-10-08 NOTE — Progress Notes (Signed)
Today I provide feedback to the patient regarding her current psychological evaluation.  Below is the summery of that evaluation.  Impression/Diagnosis:                     The results of the current psychological evaluation do suggest that the patient would be a very good/excellent candidate for spinal cord stimulator trial. There were no significant psychiatric/psychological issues that would leave her unable to effectively go through the trialing process and potentially final implantation of the device. The patient does have some stressful psychosocial issues most notably with regard to her husband's multi-infarct dementia. However, she does appear to be managing this very stressful situation in an adequate way and not becoming overwhelmed by this situation to a point that she would not be able to do what she needs to do with regard to spinal cord stimulator trials. They do have assistance each day for her husband and therefore she does not have the sole responsibility. However, it would be important to make sure that these responsibilities and efforts that she will continue to have with regard to her husband after adequate coverage during the operation and recovery periods.

## 2016-10-11 DIAGNOSIS — M961 Postlaminectomy syndrome, not elsewhere classified: Secondary | ICD-10-CM | POA: Diagnosis not present

## 2016-10-11 DIAGNOSIS — G894 Chronic pain syndrome: Secondary | ICD-10-CM | POA: Diagnosis not present

## 2016-10-16 DIAGNOSIS — M961 Postlaminectomy syndrome, not elsewhere classified: Secondary | ICD-10-CM | POA: Diagnosis not present

## 2016-10-16 DIAGNOSIS — R030 Elevated blood-pressure reading, without diagnosis of hypertension: Secondary | ICD-10-CM | POA: Diagnosis not present

## 2016-10-16 DIAGNOSIS — G894 Chronic pain syndrome: Secondary | ICD-10-CM | POA: Diagnosis not present

## 2016-10-16 DIAGNOSIS — Z6833 Body mass index (BMI) 33.0-33.9, adult: Secondary | ICD-10-CM | POA: Diagnosis not present

## 2016-10-22 DIAGNOSIS — G2581 Restless legs syndrome: Secondary | ICD-10-CM | POA: Diagnosis not present

## 2016-10-22 DIAGNOSIS — Z79891 Long term (current) use of opiate analgesic: Secondary | ICD-10-CM | POA: Diagnosis not present

## 2016-10-22 DIAGNOSIS — F419 Anxiety disorder, unspecified: Secondary | ICD-10-CM | POA: Diagnosis not present

## 2016-10-22 DIAGNOSIS — Z96652 Presence of left artificial knee joint: Secondary | ICD-10-CM | POA: Diagnosis not present

## 2016-10-22 DIAGNOSIS — G894 Chronic pain syndrome: Secondary | ICD-10-CM | POA: Diagnosis not present

## 2016-10-22 DIAGNOSIS — Z96651 Presence of right artificial knee joint: Secondary | ICD-10-CM | POA: Diagnosis not present

## 2016-10-22 DIAGNOSIS — M961 Postlaminectomy syndrome, not elsewhere classified: Secondary | ICD-10-CM | POA: Diagnosis not present

## 2016-10-22 DIAGNOSIS — M25561 Pain in right knee: Secondary | ICD-10-CM | POA: Diagnosis not present

## 2016-10-22 DIAGNOSIS — M5416 Radiculopathy, lumbar region: Secondary | ICD-10-CM | POA: Diagnosis not present

## 2016-10-22 DIAGNOSIS — M5136 Other intervertebral disc degeneration, lumbar region: Secondary | ICD-10-CM | POA: Diagnosis not present

## 2016-10-26 DIAGNOSIS — G894 Chronic pain syndrome: Secondary | ICD-10-CM | POA: Diagnosis not present

## 2016-10-26 DIAGNOSIS — M961 Postlaminectomy syndrome, not elsewhere classified: Secondary | ICD-10-CM | POA: Diagnosis not present

## 2016-11-05 DIAGNOSIS — L74519 Primary focal hyperhidrosis, unspecified: Secondary | ICD-10-CM | POA: Diagnosis not present

## 2016-11-05 DIAGNOSIS — F330 Major depressive disorder, recurrent, mild: Secondary | ICD-10-CM | POA: Diagnosis not present

## 2016-11-07 DIAGNOSIS — R030 Elevated blood-pressure reading, without diagnosis of hypertension: Secondary | ICD-10-CM | POA: Diagnosis not present

## 2016-11-07 DIAGNOSIS — Z6834 Body mass index (BMI) 34.0-34.9, adult: Secondary | ICD-10-CM | POA: Diagnosis not present

## 2016-11-07 DIAGNOSIS — M961 Postlaminectomy syndrome, not elsewhere classified: Secondary | ICD-10-CM | POA: Diagnosis not present

## 2016-11-07 DIAGNOSIS — G894 Chronic pain syndrome: Secondary | ICD-10-CM | POA: Diagnosis not present

## 2016-12-03 DIAGNOSIS — Z96651 Presence of right artificial knee joint: Secondary | ICD-10-CM | POA: Diagnosis not present

## 2016-12-03 DIAGNOSIS — M5416 Radiculopathy, lumbar region: Secondary | ICD-10-CM | POA: Diagnosis not present

## 2016-12-03 DIAGNOSIS — M961 Postlaminectomy syndrome, not elsewhere classified: Secondary | ICD-10-CM | POA: Diagnosis not present

## 2016-12-03 DIAGNOSIS — G894 Chronic pain syndrome: Secondary | ICD-10-CM | POA: Diagnosis not present

## 2016-12-03 DIAGNOSIS — Z96652 Presence of left artificial knee joint: Secondary | ICD-10-CM | POA: Diagnosis not present

## 2016-12-03 DIAGNOSIS — Z79891 Long term (current) use of opiate analgesic: Secondary | ICD-10-CM | POA: Diagnosis not present

## 2016-12-03 DIAGNOSIS — M5136 Other intervertebral disc degeneration, lumbar region: Secondary | ICD-10-CM | POA: Diagnosis not present

## 2016-12-03 DIAGNOSIS — M25561 Pain in right knee: Secondary | ICD-10-CM | POA: Diagnosis not present

## 2016-12-03 DIAGNOSIS — G2581 Restless legs syndrome: Secondary | ICD-10-CM | POA: Diagnosis not present

## 2016-12-03 DIAGNOSIS — F419 Anxiety disorder, unspecified: Secondary | ICD-10-CM | POA: Diagnosis not present

## 2016-12-05 DIAGNOSIS — Z0000 Encounter for general adult medical examination without abnormal findings: Secondary | ICD-10-CM | POA: Diagnosis not present

## 2016-12-05 DIAGNOSIS — R718 Other abnormality of red blood cells: Secondary | ICD-10-CM | POA: Diagnosis not present

## 2016-12-05 DIAGNOSIS — Z136 Encounter for screening for cardiovascular disorders: Secondary | ICD-10-CM | POA: Diagnosis not present

## 2016-12-05 DIAGNOSIS — E669 Obesity, unspecified: Secondary | ICD-10-CM | POA: Diagnosis not present

## 2016-12-05 DIAGNOSIS — D509 Iron deficiency anemia, unspecified: Secondary | ICD-10-CM | POA: Diagnosis not present

## 2016-12-05 DIAGNOSIS — E785 Hyperlipidemia, unspecified: Secondary | ICD-10-CM | POA: Diagnosis not present

## 2016-12-05 DIAGNOSIS — Z6833 Body mass index (BMI) 33.0-33.9, adult: Secondary | ICD-10-CM | POA: Diagnosis not present

## 2016-12-06 DIAGNOSIS — Z6834 Body mass index (BMI) 34.0-34.9, adult: Secondary | ICD-10-CM | POA: Diagnosis not present

## 2016-12-06 DIAGNOSIS — R030 Elevated blood-pressure reading, without diagnosis of hypertension: Secondary | ICD-10-CM | POA: Diagnosis not present

## 2016-12-06 DIAGNOSIS — M961 Postlaminectomy syndrome, not elsewhere classified: Secondary | ICD-10-CM | POA: Diagnosis not present

## 2016-12-06 DIAGNOSIS — G894 Chronic pain syndrome: Secondary | ICD-10-CM | POA: Diagnosis not present

## 2016-12-17 DIAGNOSIS — N959 Unspecified menopausal and perimenopausal disorder: Secondary | ICD-10-CM | POA: Diagnosis not present

## 2016-12-17 DIAGNOSIS — Z13820 Encounter for screening for osteoporosis: Secondary | ICD-10-CM | POA: Diagnosis not present

## 2016-12-17 DIAGNOSIS — Z1231 Encounter for screening mammogram for malignant neoplasm of breast: Secondary | ICD-10-CM | POA: Diagnosis not present

## 2016-12-25 DIAGNOSIS — G629 Polyneuropathy, unspecified: Secondary | ICD-10-CM | POA: Diagnosis not present

## 2016-12-25 DIAGNOSIS — Z79899 Other long term (current) drug therapy: Secondary | ICD-10-CM | POA: Diagnosis not present

## 2016-12-25 DIAGNOSIS — M1990 Unspecified osteoarthritis, unspecified site: Secondary | ICD-10-CM | POA: Diagnosis not present

## 2016-12-25 DIAGNOSIS — K219 Gastro-esophageal reflux disease without esophagitis: Secondary | ICD-10-CM | POA: Diagnosis not present

## 2016-12-25 DIAGNOSIS — M791 Myalgia: Secondary | ICD-10-CM | POA: Diagnosis not present

## 2016-12-25 DIAGNOSIS — M25552 Pain in left hip: Secondary | ICD-10-CM | POA: Diagnosis not present

## 2016-12-25 DIAGNOSIS — E785 Hyperlipidemia, unspecified: Secondary | ICD-10-CM | POA: Diagnosis not present

## 2016-12-25 DIAGNOSIS — F3342 Major depressive disorder, recurrent, in full remission: Secondary | ICD-10-CM | POA: Diagnosis not present

## 2017-01-08 DIAGNOSIS — F3341 Major depressive disorder, recurrent, in partial remission: Secondary | ICD-10-CM | POA: Diagnosis not present

## 2017-01-08 DIAGNOSIS — M25552 Pain in left hip: Secondary | ICD-10-CM | POA: Diagnosis not present

## 2017-01-08 DIAGNOSIS — D509 Iron deficiency anemia, unspecified: Secondary | ICD-10-CM | POA: Diagnosis not present

## 2017-02-13 DIAGNOSIS — M25551 Pain in right hip: Secondary | ICD-10-CM | POA: Diagnosis not present

## 2017-02-26 DIAGNOSIS — M9902 Segmental and somatic dysfunction of thoracic region: Secondary | ICD-10-CM | POA: Diagnosis not present

## 2017-02-26 DIAGNOSIS — F3341 Major depressive disorder, recurrent, in partial remission: Secondary | ICD-10-CM | POA: Diagnosis not present

## 2017-02-26 DIAGNOSIS — M5413 Radiculopathy, cervicothoracic region: Secondary | ICD-10-CM | POA: Diagnosis not present

## 2017-02-26 DIAGNOSIS — M9901 Segmental and somatic dysfunction of cervical region: Secondary | ICD-10-CM | POA: Diagnosis not present

## 2017-02-26 DIAGNOSIS — M62830 Muscle spasm of back: Secondary | ICD-10-CM | POA: Diagnosis not present

## 2017-03-05 DIAGNOSIS — M5413 Radiculopathy, cervicothoracic region: Secondary | ICD-10-CM | POA: Diagnosis not present

## 2017-03-05 DIAGNOSIS — M62830 Muscle spasm of back: Secondary | ICD-10-CM | POA: Diagnosis not present

## 2017-03-05 DIAGNOSIS — M9902 Segmental and somatic dysfunction of thoracic region: Secondary | ICD-10-CM | POA: Diagnosis not present

## 2017-03-05 DIAGNOSIS — M9901 Segmental and somatic dysfunction of cervical region: Secondary | ICD-10-CM | POA: Diagnosis not present

## 2017-03-06 DIAGNOSIS — L821 Other seborrheic keratosis: Secondary | ICD-10-CM | POA: Diagnosis not present

## 2017-03-06 DIAGNOSIS — L304 Erythema intertrigo: Secondary | ICD-10-CM | POA: Diagnosis not present

## 2017-03-06 DIAGNOSIS — Z23 Encounter for immunization: Secondary | ICD-10-CM | POA: Diagnosis not present

## 2017-03-06 DIAGNOSIS — D649 Anemia, unspecified: Secondary | ICD-10-CM | POA: Diagnosis not present

## 2017-03-12 DIAGNOSIS — M9902 Segmental and somatic dysfunction of thoracic region: Secondary | ICD-10-CM | POA: Diagnosis not present

## 2017-03-12 DIAGNOSIS — M5413 Radiculopathy, cervicothoracic region: Secondary | ICD-10-CM | POA: Diagnosis not present

## 2017-03-12 DIAGNOSIS — M9901 Segmental and somatic dysfunction of cervical region: Secondary | ICD-10-CM | POA: Diagnosis not present

## 2017-03-12 DIAGNOSIS — M62830 Muscle spasm of back: Secondary | ICD-10-CM | POA: Diagnosis not present

## 2017-03-14 DIAGNOSIS — M62830 Muscle spasm of back: Secondary | ICD-10-CM | POA: Diagnosis not present

## 2017-03-14 DIAGNOSIS — M9901 Segmental and somatic dysfunction of cervical region: Secondary | ICD-10-CM | POA: Diagnosis not present

## 2017-03-14 DIAGNOSIS — M9902 Segmental and somatic dysfunction of thoracic region: Secondary | ICD-10-CM | POA: Diagnosis not present

## 2017-03-14 DIAGNOSIS — M5413 Radiculopathy, cervicothoracic region: Secondary | ICD-10-CM | POA: Diagnosis not present

## 2017-03-19 DIAGNOSIS — M9901 Segmental and somatic dysfunction of cervical region: Secondary | ICD-10-CM | POA: Diagnosis not present

## 2017-03-19 DIAGNOSIS — M5413 Radiculopathy, cervicothoracic region: Secondary | ICD-10-CM | POA: Diagnosis not present

## 2017-03-19 DIAGNOSIS — M62830 Muscle spasm of back: Secondary | ICD-10-CM | POA: Diagnosis not present

## 2017-03-19 DIAGNOSIS — M9902 Segmental and somatic dysfunction of thoracic region: Secondary | ICD-10-CM | POA: Diagnosis not present

## 2017-03-28 DIAGNOSIS — M9901 Segmental and somatic dysfunction of cervical region: Secondary | ICD-10-CM | POA: Diagnosis not present

## 2017-03-28 DIAGNOSIS — M5413 Radiculopathy, cervicothoracic region: Secondary | ICD-10-CM | POA: Diagnosis not present

## 2017-03-28 DIAGNOSIS — M62830 Muscle spasm of back: Secondary | ICD-10-CM | POA: Diagnosis not present

## 2017-03-28 DIAGNOSIS — M9902 Segmental and somatic dysfunction of thoracic region: Secondary | ICD-10-CM | POA: Diagnosis not present

## 2017-04-01 DIAGNOSIS — G894 Chronic pain syndrome: Secondary | ICD-10-CM | POA: Diagnosis not present

## 2017-04-01 DIAGNOSIS — M25561 Pain in right knee: Secondary | ICD-10-CM | POA: Diagnosis not present

## 2017-04-01 DIAGNOSIS — M961 Postlaminectomy syndrome, not elsewhere classified: Secondary | ICD-10-CM | POA: Diagnosis not present

## 2017-04-01 DIAGNOSIS — M5136 Other intervertebral disc degeneration, lumbar region: Secondary | ICD-10-CM | POA: Diagnosis not present

## 2017-04-01 DIAGNOSIS — Z96651 Presence of right artificial knee joint: Secondary | ICD-10-CM | POA: Diagnosis not present

## 2017-04-01 DIAGNOSIS — Z96652 Presence of left artificial knee joint: Secondary | ICD-10-CM | POA: Diagnosis not present

## 2017-04-01 DIAGNOSIS — F419 Anxiety disorder, unspecified: Secondary | ICD-10-CM | POA: Diagnosis not present

## 2017-04-01 DIAGNOSIS — M5416 Radiculopathy, lumbar region: Secondary | ICD-10-CM | POA: Diagnosis not present

## 2017-04-01 DIAGNOSIS — G2581 Restless legs syndrome: Secondary | ICD-10-CM | POA: Diagnosis not present

## 2017-04-01 DIAGNOSIS — Z79891 Long term (current) use of opiate analgesic: Secondary | ICD-10-CM | POA: Diagnosis not present

## 2017-04-02 DIAGNOSIS — M5413 Radiculopathy, cervicothoracic region: Secondary | ICD-10-CM | POA: Diagnosis not present

## 2017-04-02 DIAGNOSIS — M5136 Other intervertebral disc degeneration, lumbar region: Secondary | ICD-10-CM | POA: Diagnosis not present

## 2017-04-02 DIAGNOSIS — M62830 Muscle spasm of back: Secondary | ICD-10-CM | POA: Diagnosis not present

## 2017-04-02 DIAGNOSIS — M4326 Fusion of spine, lumbar region: Secondary | ICD-10-CM | POA: Diagnosis not present

## 2017-04-02 DIAGNOSIS — M9902 Segmental and somatic dysfunction of thoracic region: Secondary | ICD-10-CM | POA: Diagnosis not present

## 2017-04-02 DIAGNOSIS — M9901 Segmental and somatic dysfunction of cervical region: Secondary | ICD-10-CM | POA: Diagnosis not present

## 2017-04-02 DIAGNOSIS — M545 Low back pain: Secondary | ICD-10-CM | POA: Diagnosis not present

## 2017-04-02 DIAGNOSIS — M961 Postlaminectomy syndrome, not elsewhere classified: Secondary | ICD-10-CM | POA: Diagnosis not present

## 2017-04-09 DIAGNOSIS — M9902 Segmental and somatic dysfunction of thoracic region: Secondary | ICD-10-CM | POA: Diagnosis not present

## 2017-04-09 DIAGNOSIS — M62830 Muscle spasm of back: Secondary | ICD-10-CM | POA: Diagnosis not present

## 2017-04-09 DIAGNOSIS — M5413 Radiculopathy, cervicothoracic region: Secondary | ICD-10-CM | POA: Diagnosis not present

## 2017-04-09 DIAGNOSIS — M9901 Segmental and somatic dysfunction of cervical region: Secondary | ICD-10-CM | POA: Diagnosis not present

## 2017-04-10 DIAGNOSIS — F3341 Major depressive disorder, recurrent, in partial remission: Secondary | ICD-10-CM | POA: Diagnosis not present

## 2017-04-18 DIAGNOSIS — M5413 Radiculopathy, cervicothoracic region: Secondary | ICD-10-CM | POA: Diagnosis not present

## 2017-04-18 DIAGNOSIS — M9901 Segmental and somatic dysfunction of cervical region: Secondary | ICD-10-CM | POA: Diagnosis not present

## 2017-04-18 DIAGNOSIS — M62830 Muscle spasm of back: Secondary | ICD-10-CM | POA: Diagnosis not present

## 2017-04-18 DIAGNOSIS — M9902 Segmental and somatic dysfunction of thoracic region: Secondary | ICD-10-CM | POA: Diagnosis not present

## 2017-04-30 DIAGNOSIS — G894 Chronic pain syndrome: Secondary | ICD-10-CM | POA: Diagnosis not present

## 2017-04-30 DIAGNOSIS — M5416 Radiculopathy, lumbar region: Secondary | ICD-10-CM | POA: Diagnosis not present

## 2017-04-30 DIAGNOSIS — G2581 Restless legs syndrome: Secondary | ICD-10-CM | POA: Diagnosis not present

## 2017-04-30 DIAGNOSIS — Z79891 Long term (current) use of opiate analgesic: Secondary | ICD-10-CM | POA: Diagnosis not present

## 2017-04-30 DIAGNOSIS — M25561 Pain in right knee: Secondary | ICD-10-CM | POA: Diagnosis not present

## 2017-04-30 DIAGNOSIS — Z96651 Presence of right artificial knee joint: Secondary | ICD-10-CM | POA: Diagnosis not present

## 2017-04-30 DIAGNOSIS — F419 Anxiety disorder, unspecified: Secondary | ICD-10-CM | POA: Diagnosis not present

## 2017-04-30 DIAGNOSIS — Z96652 Presence of left artificial knee joint: Secondary | ICD-10-CM | POA: Diagnosis not present

## 2017-04-30 DIAGNOSIS — M5136 Other intervertebral disc degeneration, lumbar region: Secondary | ICD-10-CM | POA: Diagnosis not present

## 2017-04-30 DIAGNOSIS — M961 Postlaminectomy syndrome, not elsewhere classified: Secondary | ICD-10-CM | POA: Diagnosis not present

## 2017-05-02 DIAGNOSIS — M9902 Segmental and somatic dysfunction of thoracic region: Secondary | ICD-10-CM | POA: Diagnosis not present

## 2017-05-02 DIAGNOSIS — M9901 Segmental and somatic dysfunction of cervical region: Secondary | ICD-10-CM | POA: Diagnosis not present

## 2017-05-02 DIAGNOSIS — M5413 Radiculopathy, cervicothoracic region: Secondary | ICD-10-CM | POA: Diagnosis not present

## 2017-05-02 DIAGNOSIS — M62830 Muscle spasm of back: Secondary | ICD-10-CM | POA: Diagnosis not present

## 2017-05-15 DIAGNOSIS — F419 Anxiety disorder, unspecified: Secondary | ICD-10-CM | POA: Diagnosis not present

## 2017-05-15 DIAGNOSIS — G894 Chronic pain syndrome: Secondary | ICD-10-CM | POA: Diagnosis not present

## 2017-05-15 DIAGNOSIS — M5136 Other intervertebral disc degeneration, lumbar region: Secondary | ICD-10-CM | POA: Diagnosis not present

## 2017-05-15 DIAGNOSIS — Z96651 Presence of right artificial knee joint: Secondary | ICD-10-CM | POA: Diagnosis not present

## 2017-05-15 DIAGNOSIS — M5416 Radiculopathy, lumbar region: Secondary | ICD-10-CM | POA: Diagnosis not present

## 2017-05-15 DIAGNOSIS — M961 Postlaminectomy syndrome, not elsewhere classified: Secondary | ICD-10-CM | POA: Diagnosis not present

## 2017-05-15 DIAGNOSIS — Z79891 Long term (current) use of opiate analgesic: Secondary | ICD-10-CM | POA: Diagnosis not present

## 2017-05-15 DIAGNOSIS — G2581 Restless legs syndrome: Secondary | ICD-10-CM | POA: Diagnosis not present

## 2017-05-15 DIAGNOSIS — M25561 Pain in right knee: Secondary | ICD-10-CM | POA: Diagnosis not present

## 2017-05-15 DIAGNOSIS — Z96652 Presence of left artificial knee joint: Secondary | ICD-10-CM | POA: Diagnosis not present

## 2017-05-24 DIAGNOSIS — J101 Influenza due to other identified influenza virus with other respiratory manifestations: Secondary | ICD-10-CM | POA: Diagnosis not present

## 2017-05-24 DIAGNOSIS — J22 Unspecified acute lower respiratory infection: Secondary | ICD-10-CM | POA: Diagnosis not present

## 2017-06-03 DIAGNOSIS — G2581 Restless legs syndrome: Secondary | ICD-10-CM | POA: Diagnosis not present

## 2017-06-03 DIAGNOSIS — M961 Postlaminectomy syndrome, not elsewhere classified: Secondary | ICD-10-CM | POA: Diagnosis not present

## 2017-06-03 DIAGNOSIS — M5136 Other intervertebral disc degeneration, lumbar region: Secondary | ICD-10-CM | POA: Diagnosis not present

## 2017-06-03 DIAGNOSIS — G894 Chronic pain syndrome: Secondary | ICD-10-CM | POA: Diagnosis not present

## 2017-06-03 DIAGNOSIS — F419 Anxiety disorder, unspecified: Secondary | ICD-10-CM | POA: Diagnosis not present

## 2017-06-03 DIAGNOSIS — M5416 Radiculopathy, lumbar region: Secondary | ICD-10-CM | POA: Diagnosis not present

## 2017-06-03 DIAGNOSIS — Z96652 Presence of left artificial knee joint: Secondary | ICD-10-CM | POA: Diagnosis not present

## 2017-06-03 DIAGNOSIS — M25561 Pain in right knee: Secondary | ICD-10-CM | POA: Diagnosis not present

## 2017-06-03 DIAGNOSIS — Z96651 Presence of right artificial knee joint: Secondary | ICD-10-CM | POA: Diagnosis not present

## 2017-06-03 DIAGNOSIS — Z79891 Long term (current) use of opiate analgesic: Secondary | ICD-10-CM | POA: Diagnosis not present

## 2017-06-04 DIAGNOSIS — J101 Influenza due to other identified influenza virus with other respiratory manifestations: Secondary | ICD-10-CM | POA: Diagnosis not present

## 2017-06-04 DIAGNOSIS — J329 Chronic sinusitis, unspecified: Secondary | ICD-10-CM | POA: Diagnosis not present

## 2017-06-06 DIAGNOSIS — F330 Major depressive disorder, recurrent, mild: Secondary | ICD-10-CM | POA: Diagnosis not present

## 2017-07-01 DIAGNOSIS — N390 Urinary tract infection, site not specified: Secondary | ICD-10-CM | POA: Diagnosis not present

## 2017-07-01 DIAGNOSIS — J22 Unspecified acute lower respiratory infection: Secondary | ICD-10-CM | POA: Diagnosis not present

## 2017-07-01 DIAGNOSIS — E785 Hyperlipidemia, unspecified: Secondary | ICD-10-CM | POA: Diagnosis not present

## 2017-07-01 DIAGNOSIS — M1990 Unspecified osteoarthritis, unspecified site: Secondary | ICD-10-CM | POA: Diagnosis not present

## 2017-07-01 DIAGNOSIS — Z79899 Other long term (current) drug therapy: Secondary | ICD-10-CM | POA: Diagnosis not present

## 2017-07-01 DIAGNOSIS — R300 Dysuria: Secondary | ICD-10-CM | POA: Diagnosis not present

## 2017-07-04 DIAGNOSIS — F3341 Major depressive disorder, recurrent, in partial remission: Secondary | ICD-10-CM | POA: Diagnosis not present

## 2017-07-19 DIAGNOSIS — G2581 Restless legs syndrome: Secondary | ICD-10-CM | POA: Diagnosis not present

## 2017-07-19 DIAGNOSIS — G894 Chronic pain syndrome: Secondary | ICD-10-CM | POA: Diagnosis not present

## 2017-07-19 DIAGNOSIS — M5136 Other intervertebral disc degeneration, lumbar region: Secondary | ICD-10-CM | POA: Diagnosis not present

## 2017-07-19 DIAGNOSIS — M961 Postlaminectomy syndrome, not elsewhere classified: Secondary | ICD-10-CM | POA: Diagnosis not present

## 2017-08-29 DIAGNOSIS — G894 Chronic pain syndrome: Secondary | ICD-10-CM | POA: Diagnosis not present

## 2017-08-29 DIAGNOSIS — G2581 Restless legs syndrome: Secondary | ICD-10-CM | POA: Diagnosis not present

## 2017-08-29 DIAGNOSIS — M5136 Other intervertebral disc degeneration, lumbar region: Secondary | ICD-10-CM | POA: Diagnosis not present

## 2017-08-29 DIAGNOSIS — M961 Postlaminectomy syndrome, not elsewhere classified: Secondary | ICD-10-CM | POA: Diagnosis not present

## 2017-09-23 DIAGNOSIS — H35363 Drusen (degenerative) of macula, bilateral: Secondary | ICD-10-CM | POA: Diagnosis not present

## 2017-10-16 DIAGNOSIS — M961 Postlaminectomy syndrome, not elsewhere classified: Secondary | ICD-10-CM | POA: Diagnosis not present

## 2017-10-16 DIAGNOSIS — M5416 Radiculopathy, lumbar region: Secondary | ICD-10-CM | POA: Diagnosis not present

## 2017-10-29 DIAGNOSIS — M961 Postlaminectomy syndrome, not elsewhere classified: Secondary | ICD-10-CM | POA: Diagnosis not present

## 2017-10-29 DIAGNOSIS — G894 Chronic pain syndrome: Secondary | ICD-10-CM | POA: Diagnosis not present

## 2017-10-29 DIAGNOSIS — M5136 Other intervertebral disc degeneration, lumbar region: Secondary | ICD-10-CM | POA: Diagnosis not present

## 2017-11-21 DIAGNOSIS — R252 Cramp and spasm: Secondary | ICD-10-CM | POA: Diagnosis not present

## 2017-11-21 DIAGNOSIS — Z1339 Encounter for screening examination for other mental health and behavioral disorders: Secondary | ICD-10-CM | POA: Diagnosis not present

## 2017-11-21 DIAGNOSIS — E785 Hyperlipidemia, unspecified: Secondary | ICD-10-CM | POA: Diagnosis not present

## 2017-11-21 DIAGNOSIS — D649 Anemia, unspecified: Secondary | ICD-10-CM | POA: Diagnosis not present

## 2017-11-21 DIAGNOSIS — K219 Gastro-esophageal reflux disease without esophagitis: Secondary | ICD-10-CM | POA: Diagnosis not present

## 2017-12-11 DIAGNOSIS — E785 Hyperlipidemia, unspecified: Secondary | ICD-10-CM | POA: Diagnosis not present

## 2017-12-11 DIAGNOSIS — Z0000 Encounter for general adult medical examination without abnormal findings: Secondary | ICD-10-CM | POA: Diagnosis not present

## 2017-12-11 DIAGNOSIS — Z1331 Encounter for screening for depression: Secondary | ICD-10-CM | POA: Diagnosis not present

## 2017-12-11 DIAGNOSIS — Z139 Encounter for screening, unspecified: Secondary | ICD-10-CM | POA: Diagnosis not present

## 2017-12-11 DIAGNOSIS — Z136 Encounter for screening for cardiovascular disorders: Secondary | ICD-10-CM | POA: Diagnosis not present

## 2017-12-11 DIAGNOSIS — Z1231 Encounter for screening mammogram for malignant neoplasm of breast: Secondary | ICD-10-CM | POA: Diagnosis not present

## 2017-12-11 DIAGNOSIS — Z9181 History of falling: Secondary | ICD-10-CM | POA: Diagnosis not present

## 2017-12-20 DIAGNOSIS — J209 Acute bronchitis, unspecified: Secondary | ICD-10-CM | POA: Diagnosis not present

## 2018-01-03 DIAGNOSIS — E559 Vitamin D deficiency, unspecified: Secondary | ICD-10-CM | POA: Diagnosis not present

## 2018-01-07 DIAGNOSIS — Z1231 Encounter for screening mammogram for malignant neoplasm of breast: Secondary | ICD-10-CM | POA: Diagnosis not present

## 2018-01-31 DIAGNOSIS — G629 Polyneuropathy, unspecified: Secondary | ICD-10-CM | POA: Diagnosis not present

## 2018-01-31 DIAGNOSIS — M722 Plantar fascial fibromatosis: Secondary | ICD-10-CM | POA: Diagnosis not present

## 2018-01-31 DIAGNOSIS — M654 Radial styloid tenosynovitis [de Quervain]: Secondary | ICD-10-CM | POA: Diagnosis not present

## 2018-01-31 DIAGNOSIS — R42 Dizziness and giddiness: Secondary | ICD-10-CM | POA: Diagnosis not present

## 2018-01-31 DIAGNOSIS — L989 Disorder of the skin and subcutaneous tissue, unspecified: Secondary | ICD-10-CM | POA: Diagnosis not present

## 2018-02-05 DIAGNOSIS — M19031 Primary osteoarthritis, right wrist: Secondary | ICD-10-CM | POA: Diagnosis not present

## 2018-02-05 DIAGNOSIS — L820 Inflamed seborrheic keratosis: Secondary | ICD-10-CM | POA: Diagnosis not present

## 2018-02-05 DIAGNOSIS — D485 Neoplasm of uncertain behavior of skin: Secondary | ICD-10-CM | POA: Diagnosis not present

## 2018-03-11 DIAGNOSIS — J986 Disorders of diaphragm: Secondary | ICD-10-CM | POA: Diagnosis not present

## 2018-03-11 DIAGNOSIS — K5900 Constipation, unspecified: Secondary | ICD-10-CM | POA: Diagnosis not present

## 2018-03-11 DIAGNOSIS — N281 Cyst of kidney, acquired: Secondary | ICD-10-CM | POA: Diagnosis not present

## 2018-03-17 DIAGNOSIS — G894 Chronic pain syndrome: Secondary | ICD-10-CM | POA: Diagnosis not present

## 2018-03-17 DIAGNOSIS — M961 Postlaminectomy syndrome, not elsewhere classified: Secondary | ICD-10-CM | POA: Diagnosis not present

## 2018-03-17 DIAGNOSIS — G2581 Restless legs syndrome: Secondary | ICD-10-CM | POA: Diagnosis not present

## 2018-03-26 DIAGNOSIS — M5136 Other intervertebral disc degeneration, lumbar region: Secondary | ICD-10-CM | POA: Diagnosis not present

## 2018-04-25 DIAGNOSIS — R109 Unspecified abdominal pain: Secondary | ICD-10-CM | POA: Diagnosis not present

## 2018-05-21 DIAGNOSIS — L820 Inflamed seborrheic keratosis: Secondary | ICD-10-CM | POA: Diagnosis not present

## 2018-05-27 DIAGNOSIS — M25511 Pain in right shoulder: Secondary | ICD-10-CM | POA: Diagnosis not present

## 2018-06-17 DIAGNOSIS — M419 Scoliosis, unspecified: Secondary | ICD-10-CM | POA: Diagnosis not present

## 2018-06-17 DIAGNOSIS — M1990 Unspecified osteoarthritis, unspecified site: Secondary | ICD-10-CM | POA: Diagnosis not present

## 2018-06-26 DIAGNOSIS — M25511 Pain in right shoulder: Secondary | ICD-10-CM | POA: Diagnosis not present

## 2018-06-26 DIAGNOSIS — M62551 Muscle wasting and atrophy, not elsewhere classified, right thigh: Secondary | ICD-10-CM | POA: Diagnosis not present

## 2018-06-26 DIAGNOSIS — M62552 Muscle wasting and atrophy, not elsewhere classified, left thigh: Secondary | ICD-10-CM | POA: Diagnosis not present

## 2018-06-26 DIAGNOSIS — M419 Scoliosis, unspecified: Secondary | ICD-10-CM | POA: Diagnosis not present

## 2018-06-26 DIAGNOSIS — M6281 Muscle weakness (generalized): Secondary | ICD-10-CM | POA: Diagnosis not present

## 2018-06-26 DIAGNOSIS — M545 Low back pain: Secondary | ICD-10-CM | POA: Diagnosis not present

## 2018-07-01 DIAGNOSIS — M419 Scoliosis, unspecified: Secondary | ICD-10-CM | POA: Diagnosis not present

## 2018-07-01 DIAGNOSIS — M545 Low back pain: Secondary | ICD-10-CM | POA: Diagnosis not present

## 2018-07-01 DIAGNOSIS — M62552 Muscle wasting and atrophy, not elsewhere classified, left thigh: Secondary | ICD-10-CM | POA: Diagnosis not present

## 2018-07-01 DIAGNOSIS — M6281 Muscle weakness (generalized): Secondary | ICD-10-CM | POA: Diagnosis not present

## 2018-07-01 DIAGNOSIS — M62551 Muscle wasting and atrophy, not elsewhere classified, right thigh: Secondary | ICD-10-CM | POA: Diagnosis not present

## 2018-07-02 DIAGNOSIS — M25511 Pain in right shoulder: Secondary | ICD-10-CM | POA: Diagnosis not present

## 2018-07-07 DIAGNOSIS — M62521 Muscle wasting and atrophy, not elsewhere classified, right upper arm: Secondary | ICD-10-CM | POA: Diagnosis not present

## 2018-07-07 DIAGNOSIS — M62552 Muscle wasting and atrophy, not elsewhere classified, left thigh: Secondary | ICD-10-CM | POA: Diagnosis not present

## 2018-07-07 DIAGNOSIS — M62551 Muscle wasting and atrophy, not elsewhere classified, right thigh: Secondary | ICD-10-CM | POA: Diagnosis not present

## 2018-07-07 DIAGNOSIS — M6281 Muscle weakness (generalized): Secondary | ICD-10-CM | POA: Diagnosis not present

## 2018-07-07 DIAGNOSIS — M79621 Pain in right upper arm: Secondary | ICD-10-CM | POA: Diagnosis not present

## 2018-07-07 DIAGNOSIS — M419 Scoliosis, unspecified: Secondary | ICD-10-CM | POA: Diagnosis not present

## 2018-07-07 DIAGNOSIS — M545 Low back pain: Secondary | ICD-10-CM | POA: Diagnosis not present

## 2018-07-09 DIAGNOSIS — M62552 Muscle wasting and atrophy, not elsewhere classified, left thigh: Secondary | ICD-10-CM | POA: Diagnosis not present

## 2018-07-09 DIAGNOSIS — M79621 Pain in right upper arm: Secondary | ICD-10-CM | POA: Diagnosis not present

## 2018-07-09 DIAGNOSIS — M545 Low back pain: Secondary | ICD-10-CM | POA: Diagnosis not present

## 2018-07-09 DIAGNOSIS — M419 Scoliosis, unspecified: Secondary | ICD-10-CM | POA: Diagnosis not present

## 2018-07-09 DIAGNOSIS — M62551 Muscle wasting and atrophy, not elsewhere classified, right thigh: Secondary | ICD-10-CM | POA: Diagnosis not present

## 2018-07-09 DIAGNOSIS — M6281 Muscle weakness (generalized): Secondary | ICD-10-CM | POA: Diagnosis not present

## 2018-07-09 DIAGNOSIS — M62521 Muscle wasting and atrophy, not elsewhere classified, right upper arm: Secondary | ICD-10-CM | POA: Diagnosis not present

## 2018-07-15 DIAGNOSIS — M545 Low back pain: Secondary | ICD-10-CM | POA: Diagnosis not present

## 2018-07-15 DIAGNOSIS — M62551 Muscle wasting and atrophy, not elsewhere classified, right thigh: Secondary | ICD-10-CM | POA: Diagnosis not present

## 2018-07-15 DIAGNOSIS — M6281 Muscle weakness (generalized): Secondary | ICD-10-CM | POA: Diagnosis not present

## 2018-07-15 DIAGNOSIS — M62521 Muscle wasting and atrophy, not elsewhere classified, right upper arm: Secondary | ICD-10-CM | POA: Diagnosis not present

## 2018-07-15 DIAGNOSIS — M79621 Pain in right upper arm: Secondary | ICD-10-CM | POA: Diagnosis not present

## 2018-07-15 DIAGNOSIS — M62552 Muscle wasting and atrophy, not elsewhere classified, left thigh: Secondary | ICD-10-CM | POA: Diagnosis not present

## 2018-07-15 DIAGNOSIS — M419 Scoliosis, unspecified: Secondary | ICD-10-CM | POA: Diagnosis not present

## 2018-07-18 DIAGNOSIS — M25521 Pain in right elbow: Secondary | ICD-10-CM | POA: Diagnosis not present

## 2018-07-18 DIAGNOSIS — M67811 Other specified disorders of synovium, right shoulder: Secondary | ICD-10-CM | POA: Diagnosis not present

## 2018-07-18 DIAGNOSIS — M25511 Pain in right shoulder: Secondary | ICD-10-CM | POA: Diagnosis not present

## 2018-07-18 DIAGNOSIS — M7521 Bicipital tendinitis, right shoulder: Secondary | ICD-10-CM | POA: Diagnosis not present

## 2018-07-28 DIAGNOSIS — J029 Acute pharyngitis, unspecified: Secondary | ICD-10-CM | POA: Diagnosis not present

## 2018-07-28 DIAGNOSIS — R05 Cough: Secondary | ICD-10-CM | POA: Diagnosis not present

## 2018-07-30 ENCOUNTER — Other Ambulatory Visit: Payer: Self-pay | Admitting: Orthopaedic Surgery

## 2018-07-30 DIAGNOSIS — M67811 Other specified disorders of synovium, right shoulder: Secondary | ICD-10-CM

## 2018-08-02 DIAGNOSIS — R531 Weakness: Secondary | ICD-10-CM | POA: Diagnosis not present

## 2018-08-03 DIAGNOSIS — R531 Weakness: Secondary | ICD-10-CM | POA: Diagnosis not present

## 2018-08-03 DIAGNOSIS — E860 Dehydration: Secondary | ICD-10-CM | POA: Diagnosis not present

## 2018-08-03 DIAGNOSIS — R110 Nausea: Secondary | ICD-10-CM | POA: Diagnosis not present

## 2018-08-03 DIAGNOSIS — E7800 Pure hypercholesterolemia, unspecified: Secondary | ICD-10-CM | POA: Diagnosis not present

## 2018-08-03 DIAGNOSIS — Z79899 Other long term (current) drug therapy: Secondary | ICD-10-CM | POA: Diagnosis not present

## 2018-08-13 DIAGNOSIS — I480 Paroxysmal atrial fibrillation: Secondary | ICD-10-CM | POA: Diagnosis not present

## 2018-08-13 DIAGNOSIS — D509 Iron deficiency anemia, unspecified: Secondary | ICD-10-CM | POA: Diagnosis not present

## 2018-08-13 DIAGNOSIS — N183 Chronic kidney disease, stage 3 (moderate): Secondary | ICD-10-CM | POA: Diagnosis not present

## 2018-08-13 DIAGNOSIS — E1142 Type 2 diabetes mellitus with diabetic polyneuropathy: Secondary | ICD-10-CM | POA: Diagnosis not present

## 2018-08-13 DIAGNOSIS — F331 Major depressive disorder, recurrent, moderate: Secondary | ICD-10-CM | POA: Diagnosis not present

## 2018-08-13 DIAGNOSIS — G2581 Restless legs syndrome: Secondary | ICD-10-CM | POA: Diagnosis not present

## 2018-08-13 DIAGNOSIS — F419 Anxiety disorder, unspecified: Secondary | ICD-10-CM | POA: Diagnosis not present

## 2018-08-13 DIAGNOSIS — E860 Dehydration: Secondary | ICD-10-CM | POA: Diagnosis not present

## 2018-08-13 DIAGNOSIS — K219 Gastro-esophageal reflux disease without esophagitis: Secondary | ICD-10-CM | POA: Diagnosis not present

## 2018-08-13 DIAGNOSIS — E785 Hyperlipidemia, unspecified: Secondary | ICD-10-CM | POA: Diagnosis not present

## 2018-08-13 DIAGNOSIS — E782 Mixed hyperlipidemia: Secondary | ICD-10-CM | POA: Diagnosis not present

## 2018-08-13 DIAGNOSIS — I129 Hypertensive chronic kidney disease with stage 1 through stage 4 chronic kidney disease, or unspecified chronic kidney disease: Secondary | ICD-10-CM | POA: Diagnosis not present

## 2018-08-13 DIAGNOSIS — E039 Hypothyroidism, unspecified: Secondary | ICD-10-CM | POA: Diagnosis not present

## 2018-08-13 DIAGNOSIS — E559 Vitamin D deficiency, unspecified: Secondary | ICD-10-CM | POA: Diagnosis not present

## 2018-08-13 DIAGNOSIS — E1121 Type 2 diabetes mellitus with diabetic nephropathy: Secondary | ICD-10-CM | POA: Diagnosis not present

## 2018-08-13 DIAGNOSIS — G629 Polyneuropathy, unspecified: Secondary | ICD-10-CM | POA: Diagnosis not present

## 2018-08-14 DIAGNOSIS — E559 Vitamin D deficiency, unspecified: Secondary | ICD-10-CM | POA: Diagnosis not present

## 2018-08-14 DIAGNOSIS — R739 Hyperglycemia, unspecified: Secondary | ICD-10-CM | POA: Diagnosis not present

## 2018-08-14 DIAGNOSIS — E782 Mixed hyperlipidemia: Secondary | ICD-10-CM | POA: Diagnosis not present

## 2018-08-14 DIAGNOSIS — R1011 Right upper quadrant pain: Secondary | ICD-10-CM | POA: Diagnosis not present

## 2018-08-14 DIAGNOSIS — E785 Hyperlipidemia, unspecified: Secondary | ICD-10-CM | POA: Diagnosis not present

## 2018-08-14 DIAGNOSIS — G8929 Other chronic pain: Secondary | ICD-10-CM | POA: Diagnosis not present

## 2018-08-14 DIAGNOSIS — Z23 Encounter for immunization: Secondary | ICD-10-CM | POA: Diagnosis not present

## 2018-08-14 DIAGNOSIS — M5136 Other intervertebral disc degeneration, lumbar region: Secondary | ICD-10-CM | POA: Diagnosis not present

## 2018-08-14 DIAGNOSIS — D509 Iron deficiency anemia, unspecified: Secondary | ICD-10-CM | POA: Diagnosis not present

## 2018-08-19 DIAGNOSIS — I10 Essential (primary) hypertension: Secondary | ICD-10-CM | POA: Diagnosis not present

## 2018-08-19 DIAGNOSIS — M25511 Pain in right shoulder: Secondary | ICD-10-CM | POA: Diagnosis not present

## 2018-08-19 DIAGNOSIS — H524 Presbyopia: Secondary | ICD-10-CM | POA: Diagnosis not present

## 2018-08-19 DIAGNOSIS — J329 Chronic sinusitis, unspecified: Secondary | ICD-10-CM | POA: Diagnosis not present

## 2018-08-19 DIAGNOSIS — J310 Chronic rhinitis: Secondary | ICD-10-CM | POA: Diagnosis not present

## 2018-08-19 DIAGNOSIS — H25813 Combined forms of age-related cataract, bilateral: Secondary | ICD-10-CM | POA: Diagnosis not present

## 2018-08-19 DIAGNOSIS — H5203 Hypermetropia, bilateral: Secondary | ICD-10-CM | POA: Diagnosis not present

## 2018-08-19 DIAGNOSIS — M62521 Muscle wasting and atrophy, not elsewhere classified, right upper arm: Secondary | ICD-10-CM | POA: Diagnosis not present

## 2018-08-19 DIAGNOSIS — G43909 Migraine, unspecified, not intractable, without status migrainosus: Secondary | ICD-10-CM | POA: Diagnosis not present

## 2018-08-19 DIAGNOSIS — H20022 Recurrent acute iridocyclitis, left eye: Secondary | ICD-10-CM | POA: Diagnosis not present

## 2018-08-20 DIAGNOSIS — M5136 Other intervertebral disc degeneration, lumbar region: Secondary | ICD-10-CM | POA: Diagnosis not present

## 2018-08-20 DIAGNOSIS — G2581 Restless legs syndrome: Secondary | ICD-10-CM | POA: Diagnosis not present

## 2018-08-20 DIAGNOSIS — Z79891 Long term (current) use of opiate analgesic: Secondary | ICD-10-CM | POA: Diagnosis not present

## 2018-08-20 DIAGNOSIS — M25561 Pain in right knee: Secondary | ICD-10-CM | POA: Diagnosis not present

## 2018-08-20 DIAGNOSIS — M961 Postlaminectomy syndrome, not elsewhere classified: Secondary | ICD-10-CM | POA: Diagnosis not present

## 2018-08-20 DIAGNOSIS — G894 Chronic pain syndrome: Secondary | ICD-10-CM | POA: Diagnosis not present

## 2018-08-20 DIAGNOSIS — F419 Anxiety disorder, unspecified: Secondary | ICD-10-CM | POA: Diagnosis not present

## 2018-08-20 DIAGNOSIS — Z96652 Presence of left artificial knee joint: Secondary | ICD-10-CM | POA: Diagnosis not present

## 2018-08-20 DIAGNOSIS — M5416 Radiculopathy, lumbar region: Secondary | ICD-10-CM | POA: Diagnosis not present

## 2018-08-20 DIAGNOSIS — Z96651 Presence of right artificial knee joint: Secondary | ICD-10-CM | POA: Diagnosis not present

## 2018-08-21 DIAGNOSIS — M62521 Muscle wasting and atrophy, not elsewhere classified, right upper arm: Secondary | ICD-10-CM | POA: Diagnosis not present

## 2018-08-21 DIAGNOSIS — M25511 Pain in right shoulder: Secondary | ICD-10-CM | POA: Diagnosis not present

## 2018-08-26 DIAGNOSIS — M67811 Other specified disorders of synovium, right shoulder: Secondary | ICD-10-CM | POA: Diagnosis not present

## 2018-08-26 DIAGNOSIS — R918 Other nonspecific abnormal finding of lung field: Secondary | ICD-10-CM | POA: Diagnosis not present

## 2018-08-26 DIAGNOSIS — M25511 Pain in right shoulder: Secondary | ICD-10-CM | POA: Diagnosis not present

## 2018-08-26 DIAGNOSIS — M25521 Pain in right elbow: Secondary | ICD-10-CM | POA: Diagnosis not present

## 2018-08-27 DIAGNOSIS — H6123 Impacted cerumen, bilateral: Secondary | ICD-10-CM | POA: Diagnosis not present

## 2018-08-27 DIAGNOSIS — S20211A Contusion of right front wall of thorax, initial encounter: Secondary | ICD-10-CM | POA: Diagnosis not present

## 2018-08-27 DIAGNOSIS — N390 Urinary tract infection, site not specified: Secondary | ICD-10-CM | POA: Diagnosis not present

## 2018-08-27 DIAGNOSIS — R079 Chest pain, unspecified: Secondary | ICD-10-CM | POA: Diagnosis not present

## 2018-08-27 DIAGNOSIS — G9389 Other specified disorders of brain: Secondary | ICD-10-CM | POA: Diagnosis not present

## 2018-08-29 DIAGNOSIS — R51 Headache: Secondary | ICD-10-CM | POA: Diagnosis not present

## 2018-08-29 DIAGNOSIS — D496 Neoplasm of unspecified behavior of brain: Secondary | ICD-10-CM | POA: Diagnosis not present

## 2018-08-29 DIAGNOSIS — Z79899 Other long term (current) drug therapy: Secondary | ICD-10-CM | POA: Diagnosis not present

## 2018-08-29 DIAGNOSIS — G9389 Other specified disorders of brain: Secondary | ICD-10-CM | POA: Diagnosis not present

## 2018-08-29 DIAGNOSIS — R930 Abnormal findings on diagnostic imaging of skull and head, not elsewhere classified: Secondary | ICD-10-CM | POA: Diagnosis not present

## 2018-08-29 DIAGNOSIS — R5383 Other fatigue: Secondary | ICD-10-CM | POA: Diagnosis not present

## 2018-08-29 DIAGNOSIS — R55 Syncope and collapse: Secondary | ICD-10-CM | POA: Diagnosis not present

## 2018-09-04 DIAGNOSIS — M62521 Muscle wasting and atrophy, not elsewhere classified, right upper arm: Secondary | ICD-10-CM | POA: Diagnosis not present

## 2018-09-04 DIAGNOSIS — M25511 Pain in right shoulder: Secondary | ICD-10-CM | POA: Diagnosis not present

## 2018-09-08 DIAGNOSIS — G2581 Restless legs syndrome: Secondary | ICD-10-CM | POA: Insufficient documentation

## 2018-09-09 ENCOUNTER — Ambulatory Visit: Payer: PPO | Admitting: Podiatry

## 2018-09-12 DIAGNOSIS — M25511 Pain in right shoulder: Secondary | ICD-10-CM | POA: Diagnosis not present

## 2018-09-12 DIAGNOSIS — M62521 Muscle wasting and atrophy, not elsewhere classified, right upper arm: Secondary | ICD-10-CM | POA: Diagnosis not present

## 2018-09-15 DIAGNOSIS — R12 Heartburn: Secondary | ICD-10-CM | POA: Diagnosis not present

## 2018-09-15 DIAGNOSIS — R1310 Dysphagia, unspecified: Secondary | ICD-10-CM | POA: Diagnosis not present

## 2018-09-16 DIAGNOSIS — M62521 Muscle wasting and atrophy, not elsewhere classified, right upper arm: Secondary | ICD-10-CM | POA: Diagnosis not present

## 2018-09-16 DIAGNOSIS — M25511 Pain in right shoulder: Secondary | ICD-10-CM | POA: Diagnosis not present

## 2018-09-18 DIAGNOSIS — F419 Anxiety disorder, unspecified: Secondary | ICD-10-CM | POA: Diagnosis not present

## 2018-09-18 DIAGNOSIS — M5416 Radiculopathy, lumbar region: Secondary | ICD-10-CM | POA: Diagnosis not present

## 2018-09-18 DIAGNOSIS — M5136 Other intervertebral disc degeneration, lumbar region: Secondary | ICD-10-CM | POA: Diagnosis not present

## 2018-09-18 DIAGNOSIS — M25561 Pain in right knee: Secondary | ICD-10-CM | POA: Diagnosis not present

## 2018-09-18 DIAGNOSIS — Z79891 Long term (current) use of opiate analgesic: Secondary | ICD-10-CM | POA: Diagnosis not present

## 2018-09-18 DIAGNOSIS — Z96651 Presence of right artificial knee joint: Secondary | ICD-10-CM | POA: Diagnosis not present

## 2018-09-18 DIAGNOSIS — G894 Chronic pain syndrome: Secondary | ICD-10-CM | POA: Diagnosis not present

## 2018-09-18 DIAGNOSIS — Z96652 Presence of left artificial knee joint: Secondary | ICD-10-CM | POA: Diagnosis not present

## 2018-09-18 DIAGNOSIS — M961 Postlaminectomy syndrome, not elsewhere classified: Secondary | ICD-10-CM | POA: Diagnosis not present

## 2018-09-18 DIAGNOSIS — G2581 Restless legs syndrome: Secondary | ICD-10-CM | POA: Diagnosis not present

## 2018-09-22 DIAGNOSIS — M25511 Pain in right shoulder: Secondary | ICD-10-CM | POA: Diagnosis not present

## 2018-09-22 DIAGNOSIS — M62521 Muscle wasting and atrophy, not elsewhere classified, right upper arm: Secondary | ICD-10-CM | POA: Diagnosis not present

## 2018-09-25 DIAGNOSIS — M25511 Pain in right shoulder: Secondary | ICD-10-CM | POA: Diagnosis not present

## 2018-09-25 DIAGNOSIS — M62521 Muscle wasting and atrophy, not elsewhere classified, right upper arm: Secondary | ICD-10-CM | POA: Diagnosis not present

## 2018-09-26 DIAGNOSIS — R12 Heartburn: Secondary | ICD-10-CM | POA: Diagnosis not present

## 2018-09-26 DIAGNOSIS — R1310 Dysphagia, unspecified: Secondary | ICD-10-CM | POA: Diagnosis not present

## 2018-10-01 DIAGNOSIS — M4150 Other secondary scoliosis, site unspecified: Secondary | ICD-10-CM | POA: Diagnosis not present

## 2018-10-01 DIAGNOSIS — M961 Postlaminectomy syndrome, not elsewhere classified: Secondary | ICD-10-CM | POA: Diagnosis not present

## 2018-10-01 DIAGNOSIS — M4716 Other spondylosis with myelopathy, lumbar region: Secondary | ICD-10-CM | POA: Diagnosis not present

## 2018-10-01 DIAGNOSIS — M545 Low back pain: Secondary | ICD-10-CM | POA: Diagnosis not present

## 2018-10-01 DIAGNOSIS — T85112A Breakdown (mechanical) of implanted electronic neurostimulator (electrode) of spinal cord, initial encounter: Secondary | ICD-10-CM | POA: Diagnosis not present

## 2018-10-03 DIAGNOSIS — M67811 Other specified disorders of synovium, right shoulder: Secondary | ICD-10-CM | POA: Diagnosis not present

## 2018-10-03 DIAGNOSIS — M25511 Pain in right shoulder: Secondary | ICD-10-CM | POA: Diagnosis not present

## 2018-10-07 DIAGNOSIS — M25511 Pain in right shoulder: Secondary | ICD-10-CM | POA: Diagnosis not present

## 2018-10-07 DIAGNOSIS — M62521 Muscle wasting and atrophy, not elsewhere classified, right upper arm: Secondary | ICD-10-CM | POA: Diagnosis not present

## 2018-10-09 DIAGNOSIS — M25511 Pain in right shoulder: Secondary | ICD-10-CM | POA: Diagnosis not present

## 2018-10-09 DIAGNOSIS — M62521 Muscle wasting and atrophy, not elsewhere classified, right upper arm: Secondary | ICD-10-CM | POA: Diagnosis not present

## 2018-10-14 DIAGNOSIS — K219 Gastro-esophageal reflux disease without esophagitis: Secondary | ICD-10-CM | POA: Diagnosis not present

## 2018-10-14 DIAGNOSIS — R1310 Dysphagia, unspecified: Secondary | ICD-10-CM | POA: Diagnosis not present

## 2018-10-15 DIAGNOSIS — M961 Postlaminectomy syndrome, not elsewhere classified: Secondary | ICD-10-CM | POA: Diagnosis not present

## 2018-10-15 DIAGNOSIS — T85112A Breakdown (mechanical) of implanted electronic neurostimulator (electrode) of spinal cord, initial encounter: Secondary | ICD-10-CM | POA: Diagnosis not present

## 2018-10-15 DIAGNOSIS — M4150 Other secondary scoliosis, site unspecified: Secondary | ICD-10-CM | POA: Diagnosis not present

## 2018-10-15 DIAGNOSIS — Z01812 Encounter for preprocedural laboratory examination: Secondary | ICD-10-CM | POA: Diagnosis not present

## 2018-10-15 DIAGNOSIS — Z1159 Encounter for screening for other viral diseases: Secondary | ICD-10-CM | POA: Diagnosis not present

## 2018-10-17 ENCOUNTER — Other Ambulatory Visit: Payer: Medicare Other

## 2018-10-21 DIAGNOSIS — N189 Chronic kidney disease, unspecified: Secondary | ICD-10-CM | POA: Diagnosis not present

## 2018-10-21 DIAGNOSIS — T85113A Breakdown (mechanical) of implanted electronic neurostimulator, generator, initial encounter: Secondary | ICD-10-CM | POA: Diagnosis not present

## 2018-10-21 DIAGNOSIS — M961 Postlaminectomy syndrome, not elsewhere classified: Secondary | ICD-10-CM | POA: Diagnosis not present

## 2018-10-21 DIAGNOSIS — M419 Scoliosis, unspecified: Secondary | ICD-10-CM | POA: Diagnosis not present

## 2018-10-21 DIAGNOSIS — T85112A Breakdown (mechanical) of implanted electronic neurostimulator (electrode) of spinal cord, initial encounter: Secondary | ICD-10-CM | POA: Diagnosis not present

## 2018-10-21 DIAGNOSIS — T85192A Other mechanical complication of implanted electronic neurostimulator (electrode) of spinal cord, initial encounter: Secondary | ICD-10-CM | POA: Diagnosis not present

## 2018-10-21 DIAGNOSIS — Z01810 Encounter for preprocedural cardiovascular examination: Secondary | ICD-10-CM | POA: Diagnosis not present

## 2018-10-23 DIAGNOSIS — M5416 Radiculopathy, lumbar region: Secondary | ICD-10-CM | POA: Diagnosis not present

## 2018-10-23 DIAGNOSIS — Z79891 Long term (current) use of opiate analgesic: Secondary | ICD-10-CM | POA: Diagnosis not present

## 2018-10-23 DIAGNOSIS — G2581 Restless legs syndrome: Secondary | ICD-10-CM | POA: Diagnosis not present

## 2018-10-23 DIAGNOSIS — M25561 Pain in right knee: Secondary | ICD-10-CM | POA: Diagnosis not present

## 2018-10-23 DIAGNOSIS — G894 Chronic pain syndrome: Secondary | ICD-10-CM | POA: Diagnosis not present

## 2018-10-23 DIAGNOSIS — Z96651 Presence of right artificial knee joint: Secondary | ICD-10-CM | POA: Diagnosis not present

## 2018-10-23 DIAGNOSIS — M5136 Other intervertebral disc degeneration, lumbar region: Secondary | ICD-10-CM | POA: Diagnosis not present

## 2018-10-23 DIAGNOSIS — Z96652 Presence of left artificial knee joint: Secondary | ICD-10-CM | POA: Diagnosis not present

## 2018-10-23 DIAGNOSIS — F419 Anxiety disorder, unspecified: Secondary | ICD-10-CM | POA: Diagnosis not present

## 2018-10-23 DIAGNOSIS — M961 Postlaminectomy syndrome, not elsewhere classified: Secondary | ICD-10-CM | POA: Diagnosis not present

## 2018-11-01 DIAGNOSIS — M25511 Pain in right shoulder: Secondary | ICD-10-CM | POA: Diagnosis not present

## 2018-11-05 DIAGNOSIS — M67811 Other specified disorders of synovium, right shoulder: Secondary | ICD-10-CM | POA: Diagnosis not present

## 2018-11-05 DIAGNOSIS — M19111 Post-traumatic osteoarthritis, right shoulder: Secondary | ICD-10-CM | POA: Diagnosis not present

## 2018-11-10 DIAGNOSIS — E559 Vitamin D deficiency, unspecified: Secondary | ICD-10-CM | POA: Diagnosis not present

## 2018-11-10 DIAGNOSIS — Z01818 Encounter for other preprocedural examination: Secondary | ICD-10-CM | POA: Diagnosis not present

## 2018-11-13 NOTE — Pre-Procedure Instructions (Signed)
Carrie Keller  11/13/2018      [facility] [place], [place] [address] Phone: [phone] Fax: [phone]  [id number] - [place], [place] [address] Phone: [phone] Fax: [phone]    Your procedure is scheduled on 11/20/18.  Report to [facility] Admitting at 530 A.M.  Call this number if you have problems the morning of surgery:  [phone]   Remember:  Do not eat or drink after midnight.     Take these medicines the morning of surgery with A SIP OF WATER --XANAX,NEURONTIN,HYDROCODONE,PROTONIX    Do not wear jewelry, make-up or nail polish.  Do not wear lotions, powders, or perfumes, or deodorant.  Do not shave 48 hours prior to surgery.  Men may shave face and neck.  Do not bring valuables to the hospital.  [facility] is not responsible for any belongings or valuables.  Contacts, dentures or bridgework may not be worn into surgery.  Leave your suitcase in the car.  After surgery it may be brought to your room.  For patients admitted to the hospital, discharge time will be determined by your treatment team.  Patients discharged the day of surgery will not be allowed to drive home.    Special instructions:  Do not take any aspirin,anti-inflammatories,vitamins,or herbal supplements 5-7 days prior to surgery.[facility] - Preparing for Surgery  Before surgery, you can play an important role.  Because skin is not sterile, your skin needs to be as free of germs as possible.  You can reduce the number of germs on you skin by washing with CHG (chlorahexidine gluconate) soap before surgery.  CHG is an antiseptic cleaner which kills germs and bonds with the skin to continue killing germs even after washing.  Oral Hygiene is also important in reducing the risk of infection.  Remember to brush your teeth with your regular toothpaste the morning of  surgery.  Please DO NOT use if you have an allergy to CHG or antibacterial soaps.  If your skin becomes reddened/irritated stop using the CHG and inform your nurse when you arrive at Short Stay.  Do not shave (including legs and underarms) for at least 48 hours prior to the first CHG shower.  You may shave your face.  Please follow these instructions carefully:   1.  Shower with CHG Soap the night before surgery and the morning of Surgery.  2.  If you choose to wash your hair, wash your hair first as usual with your normal shampoo.  3.  After you shampoo, rinse your hair and body thoroughly to remove the shampoo. 4.  Use CHG as you would any other liquid soap.  You can apply chg directly to the skin and wash gently with a      scrungie or washcloth.           5.  Apply the CHG Soap to your body ONLY FROM THE NECK DOWN.   Do not use on open wounds or open sores. Avoid contact with your eyes, ears, mouth and genitals (private parts).  Wash genitals (private parts) with your normal soap.  6.  Wash thoroughly, paying special attention to the area where your surgery will be performed.  7.  Thoroughly rinse your body with warm water from the neck down.  8.  DO NOT shower/wash with your normal soap after using and rinsing off the CHG  Soap.  9.  Pat yourself dry with a clean towel.            10.  Wear clean pajamas.            11.  Place clean sheets on your bed the night of your first shower and do not sleep with pets.  Day of Surgery  Do not apply any lotions/deoderants the morning of surgery.   Please wear clean clothes to the hospital/surgery center. Remember to brush your teeth with toothpaste.    Please read over the following fact sheets that you were given. MRSA Information

## 2018-11-17 ENCOUNTER — Other Ambulatory Visit (HOSPITAL_COMMUNITY)
Admission: RE | Admit: 2018-11-17 | Discharge: 2018-11-17 | Disposition: A | Discharge status: Home / Self Care | Payer: PPO | Source: Ambulatory Visit | Attending: Orthopaedic Surgery | Admitting: Orthopaedic Surgery

## 2018-11-17 ENCOUNTER — Encounter (HOSPITAL_COMMUNITY)
Admission: RE | Admit: 2018-11-17 | Discharge: 2018-11-17 | Disposition: A | Discharge status: Home / Self Care | Payer: PPO | Source: Ambulatory Visit | Attending: Orthopaedic Surgery | Admitting: Orthopaedic Surgery

## 2018-11-17 ENCOUNTER — Other Ambulatory Visit: Payer: Self-pay

## 2018-11-17 DIAGNOSIS — Z1159 Encounter for screening for other viral diseases: Secondary | ICD-10-CM | POA: Diagnosis not present

## 2018-11-17 DIAGNOSIS — M75101 Unspecified rotator cuff tear or rupture of right shoulder, not specified as traumatic: Secondary | ICD-10-CM | POA: Insufficient documentation

## 2018-11-17 DIAGNOSIS — Z01812 Encounter for preprocedural laboratory examination: Secondary | ICD-10-CM | POA: Diagnosis not present

## 2018-11-17 LAB — BASIC METABOLIC PANEL
Anion gap: 8 (ref 5–15)
BUN: 8 mg/dL (ref 8–23)
CO2: 26 mmol/L (ref 22–32)
Calcium: 9.3 mg/dL (ref 8.9–10.3)
Chloride: 106 mmol/L (ref 98–111)
Creatinine, Ser: 0.62 mg/dL (ref 0.44–1.00)
GFR calc Af Amer: >60 mL/min (ref >60)
GFR calc non Af Amer: >60 mL/min (ref >60)
Glucose, Bld: 122 mg/dL — ABNORMAL HIGH (ref 70–99)
Potassium: 3.8 mmol/L (ref 3.5–5.1)
Sodium: 140 mmol/L (ref 135–145)

## 2018-11-17 LAB — CBC
HCT: 40.4 % (ref 36.0–46.0)
Hemoglobin: 12.6 g/dL (ref 12.0–15.0)
MCH: 29.9 pg (ref 26.0–34.0)
MCHC: 31.2 g/dL (ref 30.0–36.0)
MCV: 95.7 fL (ref 80.0–100.0)
Platelets: 245 10*3/uL (ref 150–400)
RBC: 4.22 MIL/uL (ref 3.87–5.11)
RDW: 13.9 % (ref 11.5–15.5)
WBC: 6.6 10*3/uL (ref 4.0–10.5)
nRBC: 0 % (ref 0.0–0.2)

## 2018-11-17 LAB — SURGICAL PCR SCREEN
MRSA, PCR: POSITIVE — AB
Staphylococcus aureus: POSITIVE — AB

## 2018-11-18 LAB — SARS CORONAVIRUS 2 (TAT 6-24 HRS): SARS Coronavirus 2: NEGATIVE

## 2018-11-18 NOTE — Anesthesia Preprocedure Evaluation (Addendum)
Anesthesia Evaluation  Patient identified by MRN, date of birth, ID band Patient awake    Reviewed: Allergy & Precautions, NPO status , Patient's Chart, lab work & pertinent test results  Airway Mallampati: II  TM Distance: >3 FB Neck ROM: Full    Dental no notable dental hx. (+) Teeth Intact, Dental Advisory Given   Pulmonary shortness of breath and with exertion, former smoker, PE (post-op)   Pulmonary exam normal breath sounds clear to auscultation       Cardiovascular + Peripheral Vascular Disease and + DVT  Normal cardiovascular exam+ Valvular Problems/Murmurs MVP  Rhythm:Regular Rate:Normal     Neuro/Psych PSYCHIATRIC DISORDERS Anxiety Depression negative neurological ROS     GI/Hepatic Neg liver ROS, hiatal hernia, GERD  Medicated,  Endo/Other  negative endocrine ROS  Renal/GU negative Renal ROS  negative genitourinary   Musculoskeletal  (+) Arthritis ,   Abdominal   Peds  Hematology negative hematology ROS (+)   Anesthesia Other Findings   Reproductive/Obstetrics                            Anesthesia Physical Anesthesia Plan  ASA: III  Anesthesia Plan: General and Regional   Post-op Pain Management:  Regional for Post-op pain   Induction: Intravenous  PONV Risk Score and Plan: 4 or greater and Treatment may vary due to age or medical condition, Dexamethasone, Ondansetron, Scopolamine patch - Pre-op and Midazolam  Airway Management Planned: Oral ETT  Additional Equipment:   Intra-op Plan:   Post-operative Plan: Extubation in OR  Informed Consent: I have reviewed the patients History and Physical, chart, labs and discussed the procedure including the risks, benefits and alternatives for the proposed anesthesia with the patient or authorized representative who has indicated his/her understanding and acceptance.     Dental advisory given  Plan Discussed with:  CRNA  Anesthesia Plan Comments: (PAT note written 11/18/2018 by Myra Gianotti, PA-C. )       Anesthesia Quick Evaluation

## 2018-11-18 NOTE — Progress Notes (Signed)
Anesthesia Chart Review:  Case: [id number] Date/Time: 11/20/18 0715   Procedure: REVERSE SHOULDER ARTHROPLASTY (Right ) - 170min   Anesthesia type: General   Pre-op diagnosis: right shoulder post traumatic osteoarthritis, rotator cuff tear arthropathy   Location: [facility] OR ROOM 07 / [place] [facility]   Surgeon: Verner Mould, MD      DISCUSSION: Patient is a 73 year old female scheduled for the above procedure.  History includes former smoker (quit 1986), post-operative N/V, MVP (diagnosed 1970, asymptomatic), anxiety, RLE DVT/PE (post right TKR), HLD, GERD, venous insufficiency, exertional dyspnea, medication (Compazine) related seizure (1960's), RLS, neuropathy, scoliosis. BMI is consistent with obesity. S/p L4-5/L5-S1 PLIF 06/03/13, L5-S1 posterior lateral fusion 07/21/14. S/p paraesophageal hernia repair 04/18/12.  - She is s/p removal of spinal cord stimulator and pulse generator on 10/21/18 in [facility].   10/2018 EKG requested from [facility]--according to Care Everywhere it showed NSR. Labs acceptable for OR. Presurgical COVID test negative on 11/17/18.  If no acute changes and I would anticipate that she could proceed as planned.   VS: BP (!) 141/75   Pulse 97   Temp 37.1 C   Resp 20   Ht 5\' 1"  (1.549 m)   Wt 78.6 kg   SpO2 98%   BMI 32.74 kg/m    PROVIDERS: Melony Overly, MD is listed as PCP ([place])   LABS: Labs reviewed: Acceptable for surgery. (all labs ordered are listed, but only abnormal results are displayed)  Labs Reviewed  SURGICAL PCR SCREEN - Abnormal; Notable for the following components:      Result Value   MRSA, PCR POSITIVE (*)    Staphylococcus aureus POSITIVE (*)    All other components within normal limits  BASIC METABOLIC PANEL - Abnormal; Notable for the following components:   Glucose, Bld 122 (*)    All other components within normal limits  CBC     EKG: 10/21/18: Tracing requested, but according to [facility] Care  Everywhere: Ventricular Rate          86    BPM          Atrial Rate            86    BPM          P-R Interval            208    ms          QRS Duration            82    ms          Q-T Interval            366    ms          QTC                437    ms          P Axis               42    degrees        R Axis               -5    degrees        T Axis               35    degrees        Sinus rhythm Normal ECG When compared with ECG of  17-Apr-2012 15:20, No significant change was found Confirmed by CHEEK, H. BARRETT ([id number]) on 10/21/2018 4:07:48 PM    CV: N/A   Past Medical History:  Diagnosis Date  . Anxiety    sees Dr Jerl Santos  psychiatrist every 3 month  . Arthritis   . Complication of anesthesia   . Depression   . DVT (deep venous thrombosis) ([place]) 2008   RIGHT /POST OP  . GERD (gastroesophageal reflux disease)   . H/O hiatal hernia   . Heart murmur 1970   MVP/ asympotmatic  . Hyperlipidemia   . 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 Neuropathy    BOTH FEET AND LEGS-WORSE ON LEFT SIDE  . Pain    LOWER BACK WITH PROLONGED BENDING OVER AND STANDING- S/P SPINAL FUSION JAN 2015.  Marland Kitchen Peripheral vascular disease ([place])    venous insufficiency/ bilateral legs  . Plantar fasciitis    LEFT  . PONV (postoperative nausea and vomiting)    NO PROBLEMS WITH N&V LAST COUPLE OF SURGERIES  . Pulmonary embolism ([place]) 2008  . Restless leg syndrome   . Scoliosis   . Seizures ([place])    last seizure 1967- states reaction to COMPAZINE  . Shortness of breath    WITH EXERTION    Past Surgical History:  Procedure Laterality Date  . ABDOMINAL HYSTERECTOMY     VAGINAL HYSTERECTOMY WITH BSO  . COLONOSCOPY    . EXTERNAL EAR SURGERY      "release of nerve behind right ear for bells palsy"  . EYE SURGERY     cataracts  . JOINT REPLACEMENT  2003/2008   bilateral knees  . KNEE ARTHROSCOPY     right knee  . KNEE ARTHROSCOPY Right 01/27/2014   Procedure: ARTHROSCOPY RIGHT KNEE WITH SYNOVECTOMY;  Surgeon: Gearlean Alf, MD;  Location: [facility] ORS;  Service: Orthopedics;  Laterality: Right;  . MAXIMUM ACCESS (MAS)POSTERIOR LUMBAR INTERBODY FUSION (PLIF) 2 LEVEL N/A 06/03/2013   Procedure: FOR MAXIMUM ACCESS (MAS) POSTERIOR LUMBAR INTERBODY FUSION (PLIF) 2 LEVEL;  Surgeon: Eustace Moore, MD;  Location: [facility] [place];  Service: Neurosurgery;  Laterality: N/A;  FOR MAXIMUM ACCESS (MAS) POSTERIOR LUMBAR INTERBODY FUSION (PLIF) 2 LEVEL  . NISSEN FUNDOPLICATION  47/09  . TOTAL KNEE REVISION Right 07/02/2012   Procedure: TOTAL KNEE REVISION;  Surgeon: Gearlean Alf, MD;  Location: [facility]S;  Service: Orthopedics;  Laterality: Right;  . TUBAL LIGATION    . tummy tuck      MEDICATIONS: . ALPRAZolam (XANAX) 0.25 MG tablet  . celecoxib (CELEBREX) 200 MG capsule  . DULoxetine (CYMBALTA) 60 MG capsule  . ergocalciferol (VITAMIN D2) 1.25 MG (50000 UT) capsule  . gabapentin (NEURONTIN) 300 MG capsule  . HYDROcodone-acetaminophen (NORCO) 7.5-325 MG tablet  . Multiple Vitamins-Minerals (MULTIVITAMIN WITH MINERALS) tablet  . oxybutynin (DITROPAN-XL) 10 MG 24 hr tablet  . pantoprazole (PROTONIX) 40 MG tablet  . polyethylene glycol (MIRALAX / GLYCOLAX) packet  . pramipexole (MIRAPEX) 1 MG tablet  . traZODone (DESYREL) 100 MG tablet  . Wheat Dextrin (BENEFIBER PO)   No current facility-administered medications for this encounter.     Myra Gianotti, PA-C Surgical Short Stay/Anesthesiology [facility] Phone [phone] [facility] Phone [phone] 11/18/2018 2:57 PM

## 2018-11-24 ENCOUNTER — Other Ambulatory Visit (HOSPITAL_COMMUNITY)
Admission: RE | Admit: 2018-11-24 | Discharge: 2018-11-24 | Disposition: A | Discharge status: Home / Self Care | Payer: PPO | Source: Ambulatory Visit | Attending: Orthopaedic Surgery | Admitting: Orthopaedic Surgery

## 2018-11-24 DIAGNOSIS — Z1159 Encounter for screening for other viral diseases: Secondary | ICD-10-CM | POA: Insufficient documentation

## 2018-11-24 LAB — SARS CORONAVIRUS 2 (TAT 6-24 HRS): SARS Coronavirus 2: NEGATIVE

## 2018-11-25 ENCOUNTER — Other Ambulatory Visit: Payer: Self-pay | Admitting: Orthopaedic Surgery

## 2018-11-27 ENCOUNTER — Inpatient Hospital Stay (HOSPITAL_COMMUNITY): Payer: PPO | Admitting: Vascular Surgery

## 2018-11-27 ENCOUNTER — Inpatient Hospital Stay (HOSPITAL_COMMUNITY): Payer: PPO

## 2018-11-27 ENCOUNTER — Encounter (HOSPITAL_COMMUNITY)
Admission: RE | Disposition: A | Discharge status: Home / Self Care | Payer: Self-pay | Source: Home / Self Care | Attending: Orthopaedic Surgery

## 2018-11-27 ENCOUNTER — Encounter (HOSPITAL_COMMUNITY): Payer: Self-pay

## 2018-11-27 ENCOUNTER — Inpatient Hospital Stay (HOSPITAL_COMMUNITY): Payer: PPO | Admitting: Anesthesiology

## 2018-11-27 ENCOUNTER — Other Ambulatory Visit: Payer: Self-pay

## 2018-11-27 ENCOUNTER — Inpatient Hospital Stay (HOSPITAL_COMMUNITY)
Admission: RE | Admit: 2018-11-27 | Discharge: 2018-11-28 | DRG: 483 | Disposition: A | Discharge status: Home / Self Care | Payer: PPO | Attending: Orthopaedic Surgery | Admitting: Orthopaedic Surgery

## 2018-11-27 DIAGNOSIS — I739 Peripheral vascular disease, unspecified: Secondary | ICD-10-CM | POA: Diagnosis present

## 2018-11-27 DIAGNOSIS — Z86718 Personal history of other venous thrombosis and embolism: Secondary | ICD-10-CM

## 2018-11-27 DIAGNOSIS — F418 Other specified anxiety disorders: Secondary | ICD-10-CM | POA: Diagnosis not present

## 2018-11-27 DIAGNOSIS — Z471 Aftercare following joint replacement surgery: Secondary | ICD-10-CM | POA: Diagnosis not present

## 2018-11-27 DIAGNOSIS — Z87891 Personal history of nicotine dependence: Secondary | ICD-10-CM

## 2018-11-27 DIAGNOSIS — Z86711 Personal history of pulmonary embolism: Secondary | ICD-10-CM | POA: Diagnosis not present

## 2018-11-27 DIAGNOSIS — Z981 Arthrodesis status: Secondary | ICD-10-CM

## 2018-11-27 DIAGNOSIS — F419 Anxiety disorder, unspecified: Secondary | ICD-10-CM | POA: Diagnosis not present

## 2018-11-27 DIAGNOSIS — Z888 Allergy status to other drugs, medicaments and biological substances status: Secondary | ICD-10-CM | POA: Diagnosis not present

## 2018-11-27 DIAGNOSIS — M19111 Post-traumatic osteoarthritis, right shoulder: Secondary | ICD-10-CM | POA: Diagnosis not present

## 2018-11-27 DIAGNOSIS — Z96619 Presence of unspecified artificial shoulder joint: Secondary | ICD-10-CM

## 2018-11-27 DIAGNOSIS — Z96653 Presence of artificial knee joint, bilateral: Secondary | ICD-10-CM | POA: Diagnosis present

## 2018-11-27 DIAGNOSIS — M75121 Complete rotator cuff tear or rupture of right shoulder, not specified as traumatic: Secondary | ICD-10-CM | POA: Diagnosis present

## 2018-11-27 DIAGNOSIS — Z79899 Other long term (current) drug therapy: Secondary | ICD-10-CM

## 2018-11-27 DIAGNOSIS — I872 Venous insufficiency (chronic) (peripheral): Secondary | ICD-10-CM | POA: Diagnosis present

## 2018-11-27 DIAGNOSIS — F329 Major depressive disorder, single episode, unspecified: Secondary | ICD-10-CM | POA: Diagnosis present

## 2018-11-27 DIAGNOSIS — G629 Polyneuropathy, unspecified: Secondary | ICD-10-CM | POA: Diagnosis present

## 2018-11-27 DIAGNOSIS — K219 Gastro-esophageal reflux disease without esophagitis: Secondary | ICD-10-CM | POA: Diagnosis present

## 2018-11-27 DIAGNOSIS — G2581 Restless legs syndrome: Secondary | ICD-10-CM | POA: Diagnosis present

## 2018-11-27 DIAGNOSIS — M12811 Other specific arthropathies, not elsewhere classified, right shoulder: Secondary | ICD-10-CM | POA: Diagnosis present

## 2018-11-27 DIAGNOSIS — Z791 Long term (current) use of non-steroidal anti-inflammatories (NSAID): Secondary | ICD-10-CM | POA: Diagnosis not present

## 2018-11-27 DIAGNOSIS — G8918 Other acute postprocedural pain: Secondary | ICD-10-CM | POA: Diagnosis not present

## 2018-11-27 DIAGNOSIS — M25811 Other specified joint disorders, right shoulder: Secondary | ICD-10-CM | POA: Diagnosis not present

## 2018-11-27 DIAGNOSIS — Z96611 Presence of right artificial shoulder joint: Secondary | ICD-10-CM | POA: Diagnosis not present

## 2018-11-27 HISTORY — PX: REVERSE SHOULDER ARTHROPLASTY: SHX5054

## 2018-11-27 SURGERY — ARTHROPLASTY, SHOULDER, TOTAL, REVERSE
Anesthesia: Regional | Laterality: Right

## 2018-11-27 MED ORDER — PANTOPRAZOLE SODIUM 40 MG PO TBEC
40.0000 mg | DELAYED_RELEASE_TABLET | Freq: Every day | ORAL | Status: DC
  Administered 2018-11-28: 40 mg via ORAL
  Filled 2018-11-27 (×2): qty 1

## 2018-11-27 MED ORDER — CHLORHEXIDINE GLUCONATE 4 % EX LIQD: 60.0000 mL | Freq: Once | CUTANEOUS | Status: DC

## 2018-11-27 MED ORDER — TRAZODONE HCL 50 MG PO TABS
100.0000 mg | ORAL_TABLET | Freq: Every day | ORAL | Status: DC
  Administered 2018-11-27: 100 mg via ORAL
  Filled 2018-11-27: qty 2

## 2018-11-27 MED ORDER — ALPRAZOLAM 0.25 MG PO TABS
0.2500 mg | ORAL_TABLET | Freq: Every day | ORAL | Status: DC | PRN
  Administered 2018-11-28: 0.25 mg via ORAL
  Filled 2018-11-27: qty 1

## 2018-11-27 MED ORDER — PROPOFOL 10 MG/ML IV BOLUS
INTRAVENOUS | Status: AC
  Filled 2018-11-27: qty 20

## 2018-11-27 MED ORDER — DIPHENHYDRAMINE HCL 12.5 MG/5ML PO ELIX: 12.5000 mg | ORAL_SOLUTION | ORAL | Status: DC | PRN

## 2018-11-27 MED ORDER — LIDOCAINE 2% (20 MG/ML) 5 ML SYRINGE
INTRAMUSCULAR | Status: DC | PRN
  Administered 2018-11-27: 60 mg via INTRAVENOUS

## 2018-11-27 MED ORDER — ONDANSETRON HCL 4 MG/2ML IJ SOLN: 4.0000 mg | Freq: Four times a day (QID) | INTRAMUSCULAR | Status: DC | PRN

## 2018-11-27 MED ORDER — CEFAZOLIN SODIUM-DEXTROSE 2-4 GM/100ML-% IV SOLN
2.0000 g | INTRAVENOUS | Status: AC
  Administered 2018-11-27: 2 g via INTRAVENOUS
  Filled 2018-11-27: qty 100

## 2018-11-27 MED ORDER — DULOXETINE HCL 60 MG PO CPEP
60.0000 mg | ORAL_CAPSULE | Freq: Every day | ORAL | Status: DC
  Administered 2018-11-27: 60 mg via ORAL
  Filled 2018-11-27: qty 1

## 2018-11-27 MED ORDER — ONDANSETRON HCL 4 MG/2ML IJ SOLN
INTRAMUSCULAR | Status: DC | PRN
  Administered 2018-11-27: 4 mg via INTRAVENOUS

## 2018-11-27 MED ORDER — HYDROCODONE-ACETAMINOPHEN 7.5-325 MG PO TABS: 1.0000 | ORAL_TABLET | ORAL | Status: DC | PRN

## 2018-11-27 MED ORDER — SENNOSIDES-DOCUSATE SODIUM 8.6-50 MG PO TABS: 1.0000 | ORAL_TABLET | Freq: Every evening | ORAL | Status: DC | PRN

## 2018-11-27 MED ORDER — HYDROCODONE-ACETAMINOPHEN 5-325 MG PO TABS
1.0000 | ORAL_TABLET | ORAL | Status: DC | PRN
  Administered 2018-11-28 (×2): 2 via ORAL
  Filled 2018-11-27 (×2): qty 2

## 2018-11-27 MED ORDER — FENTANYL CITRATE (PF) 250 MCG/5ML IJ SOLN
INTRAMUSCULAR | Status: AC
  Filled 2018-11-27: qty 5

## 2018-11-27 MED ORDER — LACTATED RINGERS IV SOLN
INTRAVENOUS | Status: DC
  Administered 2018-11-27: 07:00:00 via INTRAVENOUS

## 2018-11-27 MED ORDER — VANCOMYCIN HCL POWD
Status: DC | PRN
  Administered 2018-11-27: 1000 mg

## 2018-11-27 MED ORDER — SODIUM CHLORIDE 0.9 % IV SOLN
INTRAVENOUS | Status: AC
  Filled 2018-11-27: qty 500000

## 2018-11-27 MED ORDER — VANCOMYCIN HCL 1000 MG IV SOLR
INTRAVENOUS | Status: AC
  Filled 2018-11-27: qty 1000

## 2018-11-27 MED ORDER — TRANEXAMIC ACID-NACL 1000-0.7 MG/100ML-% IV SOLN
1000.0000 mg | INTRAVENOUS | Status: AC
  Administered 2018-11-27: 08:00:00 1000 mg via INTRAVENOUS
  Filled 2018-11-27: qty 100

## 2018-11-27 MED ORDER — THROMBIN (RECOMBINANT) 20000 UNITS EX SOLR
CUTANEOUS | Status: AC
  Filled 2018-11-27: qty 20000

## 2018-11-27 MED ORDER — SUGAMMADEX SODIUM 200 MG/2ML IV SOLN
INTRAVENOUS | Status: DC | PRN
  Administered 2018-11-27: 150 mg via INTRAVENOUS

## 2018-11-27 MED ORDER — METHOCARBAMOL 500 MG PO TABS
500.0000 mg | ORAL_TABLET | Freq: Four times a day (QID) | ORAL | Status: DC | PRN
  Administered 2018-11-28: 500 mg via ORAL
  Filled 2018-11-27: qty 1

## 2018-11-27 MED ORDER — ACETAMINOPHEN 500 MG PO TABS
1000.0000 mg | ORAL_TABLET | Freq: Once | ORAL | Status: AC
  Administered 2018-11-27: 1000 mg via ORAL
  Filled 2018-11-27: qty 2

## 2018-11-27 MED ORDER — FENTANYL CITRATE (PF) 250 MCG/5ML IJ SOLN
INTRAMUSCULAR | Status: DC | PRN
  Administered 2018-11-27: 100 ug via INTRAVENOUS

## 2018-11-27 MED ORDER — 0.9 % SODIUM CHLORIDE (POUR BTL) OPTIME
TOPICAL | Status: DC | PRN
  Administered 2018-11-27: 1000 mL

## 2018-11-27 MED ORDER — METHOCARBAMOL 1000 MG/10ML IJ SOLN
500.0000 mg | Freq: Four times a day (QID) | INTRAVENOUS | Status: DC | PRN
  Filled 2018-11-27: qty 5

## 2018-11-27 MED ORDER — PROPOFOL 10 MG/ML IV BOLUS
INTRAVENOUS | Status: DC | PRN
  Administered 2018-11-27: 120 mg via INTRAVENOUS

## 2018-11-27 MED ORDER — FENTANYL CITRATE (PF) 100 MCG/2ML IJ SOLN: 25.0000 ug | INTRAMUSCULAR | Status: DC | PRN

## 2018-11-27 MED ORDER — MIDAZOLAM HCL 5 MG/5ML IJ SOLN
INTRAMUSCULAR | Status: DC | PRN
  Administered 2018-11-27: 2 mg via INTRAVENOUS

## 2018-11-27 MED ORDER — TRANEXAMIC ACID 1000 MG/10ML IV SOLN
2000.0000 mg | Freq: Once | INTRAVENOUS | Status: DC
  Filled 2018-11-27: qty 20

## 2018-11-27 MED ORDER — SODIUM CHLORIDE 0.9 % IV SOLN
INTRAVENOUS | Status: DC | PRN
  Administered 2018-11-27: 40 ug/min via INTRAVENOUS

## 2018-11-27 MED ORDER — CEFAZOLIN SODIUM-DEXTROSE 1-4 GM/50ML-% IV SOLN
1.0000 g | Freq: Three times a day (TID) | INTRAVENOUS | Status: DC
  Administered 2018-11-27 – 2018-11-28 (×2): 1 g via INTRAVENOUS
  Filled 2018-11-27 (×3): qty 50

## 2018-11-27 MED ORDER — VANCOMYCIN HCL IN DEXTROSE 1-5 GM/200ML-% IV SOLN
1000.0000 mg | INTRAVENOUS | Status: AC
  Administered 2018-11-27: 1000 mg via INTRAVENOUS

## 2018-11-27 MED ORDER — OXYBUTYNIN CHLORIDE ER 10 MG PO TB24
10.0000 mg | ORAL_TABLET | Freq: Every day | ORAL | Status: DC
  Administered 2018-11-27: 10 mg via ORAL
  Filled 2018-11-27 (×2): qty 1

## 2018-11-27 MED ORDER — ACETAMINOPHEN 325 MG PO TABS: 325.0000 mg | ORAL_TABLET | Freq: Four times a day (QID) | ORAL | Status: DC | PRN

## 2018-11-27 MED ORDER — ONDANSETRON HCL 4 MG PO TABS: 4.0000 mg | ORAL_TABLET | Freq: Four times a day (QID) | ORAL | Status: DC | PRN

## 2018-11-27 MED ORDER — BUPIVACAINE HCL (PF) 0.5 % IJ SOLN
INTRAMUSCULAR | Status: DC | PRN
  Administered 2018-11-27: 15 mL via PERINEURAL

## 2018-11-27 MED ORDER — VASOPRESSIN 20 UNIT/ML IV SOLN
INTRAVENOUS | Status: DC | PRN
  Administered 2018-11-27: 1 [IU] via INTRAVENOUS

## 2018-11-27 MED ORDER — SODIUM CHLORIDE 0.9 % IR SOLN
Status: DC | PRN
  Administered 2018-11-27: 3000 mL

## 2018-11-27 MED ORDER — ROCURONIUM BROMIDE 10 MG/ML (PF) SYRINGE
PREFILLED_SYRINGE | INTRAVENOUS | Status: DC | PRN
  Administered 2018-11-27: 60 mg via INTRAVENOUS

## 2018-11-27 MED ORDER — POLYETHYLENE GLYCOL 3350 17 G PO PACK: 17.0000 g | PACK | Freq: Every day | ORAL | Status: DC | PRN

## 2018-11-27 MED ORDER — BUPIVACAINE LIPOSOME 1.3 % IJ SUSP
INTRAMUSCULAR | Status: DC | PRN
  Administered 2018-11-27: 10 mL via PERINEURAL

## 2018-11-27 MED ORDER — GABAPENTIN 300 MG PO CAPS
900.0000 mg | ORAL_CAPSULE | Freq: Every day | ORAL | Status: DC
  Administered 2018-11-27: 900 mg via ORAL
  Filled 2018-11-27: qty 3

## 2018-11-27 MED ORDER — VANCOMYCIN HCL IN DEXTROSE 1-5 GM/200ML-% IV SOLN
INTRAVENOUS | Status: AC
  Filled 2018-11-27: qty 200

## 2018-11-27 MED ORDER — PRAMIPEXOLE DIHYDROCHLORIDE 1 MG PO TABS
2.0000 mg | ORAL_TABLET | Freq: Every day | ORAL | Status: DC
  Administered 2018-11-27: 2 mg via ORAL
  Filled 2018-11-27 (×2): qty 2

## 2018-11-27 MED ORDER — TRANEXAMIC ACID 1000 MG/10ML IV SOLN
INTRAVENOUS | Status: DC | PRN
  Administered 2018-11-27: 09:00:00 2000 mg via TOPICAL

## 2018-11-27 MED ORDER — MIDAZOLAM HCL 2 MG/2ML IJ SOLN
INTRAMUSCULAR | Status: AC
  Filled 2018-11-27: qty 2

## 2018-11-27 MED ORDER — DEXAMETHASONE SODIUM PHOSPHATE 10 MG/ML IJ SOLN
INTRAMUSCULAR | Status: DC | PRN
  Administered 2018-11-27: 5 mg via INTRAVENOUS

## 2018-11-27 MED ORDER — METHYLENE BLUE 0.5 % INJ SOLN
INTRAVENOUS | Status: AC
  Filled 2018-11-27: qty 10

## 2018-11-27 MED ORDER — PHENYLEPHRINE 40 MCG/ML (10ML) SYRINGE FOR IV PUSH (FOR BLOOD PRESSURE SUPPORT)
PREFILLED_SYRINGE | INTRAVENOUS | Status: DC | PRN
  Administered 2018-11-27: 200 ug via INTRAVENOUS

## 2018-11-27 MED ORDER — VASOPRESSIN 20 UNIT/ML IV SOLN
INTRAVENOUS | Status: AC
  Filled 2018-11-27: qty 1

## 2018-11-27 MED ORDER — MORPHINE SULFATE (PF) 2 MG/ML IV SOLN: 0.5000 mg | INTRAVENOUS | Status: DC | PRN

## 2018-11-27 MED FILL — Vancomycin HCl For IV Soln 1 GM (Base Equivalent): INTRAVENOUS | Qty: 1000 | Status: AC

## 2018-11-27 SURGICAL SUPPLY — 80 items
BAG DECANTER FOR FLEXI CONT (MISCELLANEOUS) ×6 IMPLANT
BASEPLATE GLENIOD RSA 28 (Shoulder) ×2 IMPLANT
BASEPLATE GLENIOD RSA 28MM (Shoulder) ×1 IMPLANT
BIT DRILL 3.1 DIA REUNION (BIT) ×2 IMPLANT
BIT DRILL 3.1MM DIA REUNION (BIT) ×1
BIT DRILL 5/64X5 DISP (BIT) ×3 IMPLANT
BLADE SAW SAG 73X25 THK (BLADE) ×2
BLADE SAW SGTL 73X25 THK (BLADE) ×1 IMPLANT
BLADE SURG 15 STRL LF DISP TIS (BLADE) ×1 IMPLANT
BLADE SURG 15 STRL SS (BLADE) ×2
CHLORAPREP W/TINT 26 (MISCELLANEOUS) ×6 IMPLANT
CLOSURE STERI-STRIP 1/2X4 (GAUZE/BANDAGES/DRESSINGS) ×1
CLOSURE WOUND 1/2 X4 (GAUZE/BANDAGES/DRESSINGS) ×1
CLSR STERI-STRIP ANTIMIC 1/2X4 (GAUZE/BANDAGES/DRESSINGS) ×2 IMPLANT
COVER SURGICAL LIGHT HANDLE (MISCELLANEOUS) ×3 IMPLANT
COVER WAND RF STERILE (DRAPES) ×3 IMPLANT
CUP HUMERAL RSA REUNION 32X4MM (Shoulder) ×3 IMPLANT
DECANTER SPIKE VIAL GLASS SM (MISCELLANEOUS) IMPLANT
DRAPE INCISE IOBAN 66X45 STRL (DRAPES) ×3 IMPLANT
DRAPE ORTHO SPLIT 77X108 STRL (DRAPES) ×4
DRAPE SURG 17X23 STRL (DRAPES) ×3 IMPLANT
DRAPE SURG ORHT 6 SPLT 77X108 (DRAPES) ×2 IMPLANT
DRAPE U-SHAPE 47X51 STRL (DRAPES) ×6 IMPLANT
DRESSING AQUACEL AG SP 3.5X10 (GAUZE/BANDAGES/DRESSINGS) ×1 IMPLANT
DRSG AQUACEL AG ADV 3.5X10 (GAUZE/BANDAGES/DRESSINGS) ×3 IMPLANT
DRSG AQUACEL AG SP 3.5X10 (GAUZE/BANDAGES/DRESSINGS) ×3
DRSG PAD ABDOMINAL 8X10 ST (GAUZE/BANDAGES/DRESSINGS) ×9 IMPLANT
ELECT BLADE 4.0 EZ CLEAN MEGAD (MISCELLANEOUS) ×3
ELECT CAUTERY BLADE 6.4 (BLADE) ×3 IMPLANT
ELECT REM PT RETURN 9FT ADLT (ELECTROSURGICAL) ×3
ELECTRODE BLDE 4.0 EZ CLN MEGD (MISCELLANEOUS) ×1 IMPLANT
ELECTRODE REM PT RTRN 9FT ADLT (ELECTROSURGICAL) ×1 IMPLANT
GAUZE SPONGE 4X4 12PLY STRL LF (GAUZE/BANDAGES/DRESSINGS) IMPLANT
GLENOID CONC REUNION 32X6 (Shoulder) ×3 IMPLANT
GLOVE BIOGEL PI IND STRL 8 (GLOVE) ×1 IMPLANT
GLOVE BIOGEL PI INDICATOR 8 (GLOVE) ×2
GLOVE SURG SYN 7.5  E (GLOVE) ×2
GLOVE SURG SYN 7.5 E (GLOVE) ×1 IMPLANT
GOWN STRL REUS W/ TWL LRG LVL3 (GOWN DISPOSABLE) ×2 IMPLANT
GOWN STRL REUS W/ TWL XL LVL3 (GOWN DISPOSABLE) ×1 IMPLANT
GOWN STRL REUS W/TWL LRG LVL3 (GOWN DISPOSABLE) ×4
GOWN STRL REUS W/TWL XL LVL3 (GOWN DISPOSABLE) ×2
GUIDEWIRE SHLD REUNION 19 (WIRE) ×3 IMPLANT
HANDPIECE INTERPULSE COAX TIP (DISPOSABLE) ×2
HEMOSTAT SURGICEL 2X14 (HEMOSTASIS) ×3 IMPLANT
INSERT HUMERAL CR X3 32X4 (Insert) ×3 IMPLANT
KIT BASIN OR (CUSTOM PROCEDURE TRAY) ×3 IMPLANT
KIT TURNOVER KIT B (KITS) ×3 IMPLANT
MANIFOLD NEPTUNE II (INSTRUMENTS) ×3 IMPLANT
NEEDLE MAYO TROCAR (NEEDLE) ×3 IMPLANT
NS IRRIG 1000ML POUR BTL (IV SOLUTION) ×3 IMPLANT
PACK SHOULDER (CUSTOM PROCEDURE TRAY) ×3 IMPLANT
PAD ARMBOARD 7.5X6 YLW CONV (MISCELLANEOUS) ×6 IMPLANT
RESTRAINT HEAD UNIVERSAL NS (MISCELLANEOUS) ×3 IMPLANT
RETRIEVER SUT HEWSON (MISCELLANEOUS) ×3 IMPLANT
SCREW CENTER PERIPH 6.5X28 (Screw) ×3 IMPLANT
SCREW PERIPH 4.5X20 (Screw) ×3 IMPLANT
SCREW PERIPHERAL 4.5X16MM (Screw) ×6 IMPLANT
SCREW PERIPHERAL 4.5X24MM (Screw) ×3 IMPLANT
SET HNDPC FAN SPRY TIP SCT (DISPOSABLE) ×1 IMPLANT
SLING ARM FOAM STRAP LRG (SOFTGOODS) ×3 IMPLANT
SLING ARM FOAM STRAP MED (SOFTGOODS) IMPLANT
SMARTMIX MINI TOWER (MISCELLANEOUS)
SPONGE LAP 18X18 RF (DISPOSABLE) ×3 IMPLANT
SPONGE LAP 4X18 RFD (DISPOSABLE) IMPLANT
STEM HUMERAL PF 9X93 (Stem) ×3 IMPLANT
STRIP CLOSURE SKIN 1/2X4 (GAUZE/BANDAGES/DRESSINGS) ×2 IMPLANT
SUCTION FRAZIER HANDLE 10FR (MISCELLANEOUS) ×2
SUCTION TUBE FRAZIER 10FR DISP (MISCELLANEOUS) ×1 IMPLANT
SUPPORT WRAP ARM LG (MISCELLANEOUS) IMPLANT
SUT ETHIBOND NAB CT1 #1 30IN (SUTURE) IMPLANT
SUT FIBERWIRE #2 38 T-5 BLUE (SUTURE) ×3
SUT MNCRL AB 4-0 PS2 18 (SUTURE) IMPLANT
SUT VIC AB 2-0 CT1 27 (SUTURE)
SUT VIC AB 2-0 CT1 TAPERPNT 27 (SUTURE) IMPLANT
SUTURE FIBERWR #2 38 T-5 BLUE (SUTURE) ×1 IMPLANT
TAPE LABRALWHITE 1.5X36 (TAPE) IMPLANT
TAPE SUT LABRALTAP WHT/BLK (SUTURE) IMPLANT
TOWEL GREEN STERILE (TOWEL DISPOSABLE) ×3 IMPLANT
TOWER SMARTMIX MINI (MISCELLANEOUS) IMPLANT

## 2018-11-27 NOTE — Evaluation (Signed)
Physical Therapy Evaluation Patient Details Name: Carrie Keller MRN: [id number] DOB: [birth year] Today's Date: 11/27/2018   History of Present Illness  Pt is a 73 y/o female s/p R reverse TSA. PMH includes DVT, PVD, neuropathy, scoliosis, and seizures.   Clinical Impression  Pt is s/p surgery above with deficits below. Pt requiring min to min guard A for steadying during gait this session. Feel some of instability due to grogginess, as pt woke up from deep sleep at beginning of session. Pt reports daughter will be coming to stay with her at d/c. Anticipate pt will progress well and will not require follow up PT at d/c. Will continue to follow acutely to maximize functional mobility independence and safety.     Follow Up Recommendations No PT follow up;Supervision for mobility/OOB    Equipment Recommendations  None recommended by PT    Recommendations for Other Services       Precautions / Restrictions Precautions Precautions: Fall Required Braces or Orthoses: Sling(at all times) Restrictions Weight Bearing Restrictions: Yes RUE Weight Bearing: Non weight bearing      Mobility  Bed Mobility Overal bed mobility: Needs Assistance Bed Mobility: Supine to Sit     Supine to sit: Min assist     General bed mobility comments: Min A for trunk elevation. Cues to get up on L side of bed to avoid bearing weight on RUE.   Transfers Overall transfer level: Needs assistance Equipment used: None Transfers: Sit to/from Stand Sit to Stand: Min assist         General transfer comment: Min A for steadying assist. Pt groggy as she was in deep sleep prior to session.   Ambulation/Gait Ambulation/Gait assistance: Min assist;Min guard Gait Distance (Feet): 200 Feet Assistive device: IV Pole Gait Pattern/deviations: Step-through pattern;Decreased stride length;Drifts right/left Gait velocity: Decreased    General Gait Details: Slow, mildly unsteady gait. Required min to min guard A for  steadying assist throughout. Pt using IV pole for support.   Stairs            Wheelchair Mobility    Modified Rankin (Stroke Patients Only)       Balance Overall balance assessment: Needs assistance Sitting-balance support: No upper extremity supported;Feet supported Sitting balance-Leahy Scale: Good     Standing balance support: Single extremity supported;During functional activity Standing balance-Leahy Scale: Poor Standing balance comment: Reliant on at least 1 UE support                              Pertinent Vitals/Pain Pain Assessment: No/denies pain    Home Living Family/patient expects to be discharged to:: Private residence Living Arrangements: Alone Available Help at Discharge: Family;Available 24 hours/day Type of Home: House Home Access: Level entry     Home Layout: One level Home Equipment: Walker - 2 wheels;Cane - single point;Bedside commode Additional Comments: Reports daughter will be coming to stay with pt    Prior Function Level of Independence: Independent               Hand Dominance        Extremity/Trunk Assessment   Upper Extremity Assessment Upper Extremity Assessment: Defer to OT evaluation(RUE in sling and numb following surgery )    Lower Extremity Assessment Lower Extremity Assessment: Generalized weakness    Cervical / Trunk Assessment Cervical / Trunk Assessment: Normal  Communication   Communication: No difficulties  Cognition Arousal/Alertness: Awake/alert Behavior During Therapy: [facility] for  tasks assessed/performed Overall Cognitive Status: Within Functional Limits for tasks assessed                                        General Comments      Exercises     Assessment/Plan    PT Assessment Patient needs continued PT services  PT Problem List Decreased range of motion;Decreased balance;Decreased mobility;Decreased strength;Decreased knowledge of precautions       PT  Treatment Interventions Gait training;Functional mobility training;Therapeutic activities;Therapeutic exercise;Balance training;Patient/family education    PT Goals (Current goals can be found in the Care Plan section)  Acute Rehab PT Goals Patient Stated Goal: to go home tomorrow PT Goal Formulation: With patient Time For Goal Achievement: 12/11/18 Potential to Achieve Goals: Good    Frequency Min 3X/week   Barriers to discharge        Co-evaluation               AM-PAC PT "6 Clicks" Mobility  Outcome Measure Help needed turning from your back to your side while in a flat bed without using bedrails?: A Little Help needed moving from lying on your back to sitting on the side of a flat bed without using bedrails?: A Little Help needed moving to and from a bed to a chair (including a wheelchair)?: A Little Help needed standing up from a chair using your arms (e.g., wheelchair or bedside chair)?: A Little Help needed to walk in hospital room?: A Little Help needed climbing 3-5 steps with a railing? : A Lot 6 Click Score: 17    End of Session Equipment Utilized During Treatment: Gait belt;Other (comment)(RUE sling) Activity Tolerance: Patient tolerated treatment well Patient left: in bed;with call bell/phone within reach;with bed alarm set Nurse Communication: Mobility status PT Visit Diagnosis: Unsteadiness on feet (R26.81)    Time: 1443-1540 PT Time Calculation (min) (ACUTE ONLY): 19 min   Charges:   PT Evaluation $PT Eval Low Complexity: [place], PT, DPT  Acute Rehabilitation Services  Pager: [phone] Office: [phone]   Carrie Keller 11/27/2018, 3:53 PM

## 2018-11-27 NOTE — Anesthesia Postprocedure Evaluation (Signed)
Anesthesia Post Note  Patient: Carrie Keller  Procedure(s) Performed: REVERSE SHOULDER ARTHROPLASTY (Right )     Patient location during evaluation: PACU Anesthesia Type: Regional and General Level of consciousness: awake and alert Pain management: pain level controlled Vital Signs Assessment: post-procedure vital signs reviewed and stable Respiratory status: spontaneous breathing, nonlabored ventilation, respiratory function stable and patient connected to nasal cannula oxygen Cardiovascular status: blood pressure returned to baseline and stable Postop Assessment: no apparent nausea or vomiting Anesthetic complications: no    Last Vitals:  Vitals:   11/27/18 1015 11/27/18 1029  BP: 124/69 120/74  Pulse: 83 81  Resp: 19 20  Temp:  36.4 C  SpO2: 94% 99%    Last Pain:  Vitals:   11/27/18 1015  TempSrc:   PainSc: 0-No pain                 Terran Klinke L Blandina Renaldo

## 2018-11-27 NOTE — Anesthesia Procedure Notes (Signed)
Anesthesia Regional Block: Interscalene brachial plexus block   Pre-Anesthetic Checklist: ,, timeout performed, Correct Patient, Correct Site, Correct Laterality, Correct Procedure, Correct Position, site marked, Risks and benefits discussed,  Surgical consent,  Pre-op evaluation,  At surgeon's request and post-op pain management  Laterality: Right  Prep: Maximum Sterile Barrier Precautions used, chloraprep       Needles:  Injection technique: Single-shot  Needle Type: Echogenic Stimulator Needle     Needle Length: 5cm  Needle Gauge: 22     Additional Needles:   Procedures:,,,, ultrasound used (permanent image in chart),,,,  Narrative:  Start time: 11/27/2018 6:50 AM End time: 11/27/2018 7:00 AM Injection made incrementally with aspirations every 5 mL.  Performed by: Personally  Anesthesiologist: Freddrick March, MD  Additional Notes: Monitors applied. No increased pain on injection. No increased resistance to injection. Injection made in 5cc increments. Good needle visualization. Patient tolerated procedure well.

## 2018-11-27 NOTE — Op Note (Signed)
PREOPERATIVE DIAGNOSIS: Right shoulder rotator cuff tear arthropathy  POSTOPERATIVE DIAGNOSIS: Same  ATTENDING PHYSICIAN: Maudry Mayhew. Jeannie Fend, III, MD who was present and scrubbed for the entire case   ASSISTANT SURGEON: None.   ANESTHESIA: General with regional  SURGICAL PROCEDURES: Right shoulder reverse arthroplasty  SURGICAL INDICATIONS: Patient is a 72 year old female who was seen and evaluated by me in clinic.  She had a long history of right shoulder pain.  Through work-up she was found to have right shoulder rotator cuff tear arthropathy.  She underwent multiple steroid injections as well as physical therapy.  Despite this she had continued pain and inability to use the right arm.  After failing conservative treatment she presented today for right shoulder reverse arthroplasty.  FINDING: Full-thickness tear of the supraspinatus with retraction in addition to degenerative changes of the glenohumeral joint with cartilage loss.  Stable shoulder following right reverse arthroplasty.  DESCRIPTION OF PROCEDURE: Patient was identified in the preop holding area where the risk benefits and alternatives of the procedure were discussed the patient.  These include but are not limited to infection, bleeding, damage to surrounding structures including blood vessels and nerves, pain, stiffness, implant failure, dislocation and need for additional procedures.  Informed consent was obtained the patient's right shoulder was marked with surgical marking pen.  She then underwent right upper extremity plexus block by anesthesia.  She was then brought back to operative suite where timeout was performed identifying the correct patient operative site.  She was positioned supine on operative table and induced under general anesthesia.  Once fully asleep she was sat up into the beachchair position.  All bony prominences were well-padded.  Her head was positioned appropriately.  The right upper extremity was then  prepped and draped in usual sterile fashion.  A standard deltopectoral approach was utilized.  A longitudinal incision was made and skin and subcutaneous tissue were dissected with electrocautery.  Cephalic vein was visualized and mobilized medially.  The clavipectoral fascia was identified and incised mobilizing the conjoined tendon medially.  Retractors were placed underneath the deltoid and subdeltoid space was established.  The rotator interval was identified in the bicep tendon was tenodesed to the pectoralis major.  It was then excised proximally within the wound.  The subscapularis tendon was then elevated off of the lesser tuberosity using electrocautery.  There was full-thickness tear of the supraspinatus with capsular tissue only remaining on the superior humeral head.  This was excised.  The humeral head was retracted out into the wound.  The extra medullary cutting guide was then applied to the humerus and pinned in place.  This was set at 135 degree neck shaft angle.  The humeral head was then cut at appropriate depth and removed.  Rondure was utilized to remove small osteophytes through the inferior and posterior aspects of the remaining humeral head.  The canal finder was then inserted through the cut surface and the shaft was reamed sequentially up to a size 9.  Once a size 9 was used there was good purchase and chatter with the reamer.  The reamers were then removed and a size 7 and size 8 broach were utilized.  The 8 broach had good fit and was left in place.  Retractors were all removed and attention was turned to glenoid preparation.  Glenoid retractors were placed in the anterior, posterior and superior margins of the glenoid.  Soft tissue releases were made circumferentially excising the remaining bicep tendon as well as the labrum.  Once  full visualization of the glenoid was achieved and all soft tissue attachments were released the 10 degree inferior tilt guide was placed in the central  portion of the glenoid.  This had bicortical purchase.  The glenoid reamer was then utilized removing the cortical and subchondral bone with more being removed inferiorly then superiorly.  The guidepin was removed and the hole was measured to a depth of 24 mm.  The standard baseplate and 28 mm screw were then placed with excellent purchase through the central screw.  4 peripheral screws were then inserted after drilling which measured 24 mm inferior 20 mm superior and 16 mm anterior posterior.  The wound was then copiously irrigated and a 32+6 glenosphere was then impacted into place.  This had excellent purchase and stability.  We turned our attention back to the humeral preparation.  Trial humeral caps were placed onto the broach and the shoulder was reduced.  This had excellent stability with a 4 tray and 4 poly-.  The trial components were all removed and a size 9 Reunion S stem with a 4 tray and 4 constrained poly-were then impacted.  The shoulder was reduced and taken through series of range of motion's.  This had excellent range of motion without impingement.  He also had stability and was not easily dislocated.  At this point the wound was then once again copiously irrigated with normal saline.  Antibiotic impregnated saline was also used to cleanse the wound.  Topical TXA was flushed through the incision site followed by powdered vancomycin.  The superficial fascia was closed with a running 0 Vicryl suture.  The skin was closed with a deep 2-0 Vicryl and 3-0 running Monocryl suture.  Steri-Strips and an Aquasol dressing were placed.  The arm was placed into a shoulder immobilizer.  The patient was awoken from anesthesia and extubated in the operating room without any complications.  She was taken to the PACU in stable condition.  ESTIMATED BLOOD LOSS:  150 ml   SPECIMENS: None  POSTOPERATIVE PLAN: Patient will be admitted to the floor for postoperative observation.  Will obtain x-rays in PACU.  If  her pain is well controlled tomorrow we will plan on discharge home.  She will then follow-up with me in clinic in approximately 10 to 14 days.  At her follow-up appointment we will obtain repeat radiographs of the right shoulder.  At that point she can slowly wean out of her sling and we will start therapy at 6 weeks postoperatively.  IMPLANTS: Stryker reunion S reverse shoulder system with standard baseplate and 28 mm central screw.  Peripheral screw sizes as above.  32+6 glenosphere.  Size 9 humeral stem with 4 tray and 4 constrained poly

## 2018-11-27 NOTE — Anesthesia Procedure Notes (Signed)
Procedure Name: Intubation Date/Time: 11/27/2018 7:42 AM Performed by: Imagene Riches, CRNA Pre-anesthesia Checklist: Patient identified, Emergency Drugs available, Suction available and Patient being monitored Patient Re-evaluated:Patient Re-evaluated prior to induction Oxygen Delivery Method: Circle System Utilized Preoxygenation: Pre-oxygenation with 100% oxygen Induction Type: IV induction Ventilation: Mask ventilation without difficulty Laryngoscope Size: Miller and 2 Grade View: Grade I Tube type: Oral Tube size: 7.0 mm Number of attempts: 1 Airway Equipment and Method: Stylet and Oral airway Placement Confirmation: ETT inserted through vocal cords under direct vision,  positive ETCO2 and breath sounds checked- equal and bilateral Secured at: 21 cm Tube secured with: Tape Dental Injury: Teeth and Oropharynx as per pre-operative assessment

## 2018-11-27 NOTE — H&P (Signed)
ORTHOPAEDIC H&P  PCP:  Melony Overly, MD  Chief Complaint: Right shoulder pain  HPI: Carrie Keller is a 73 y.o. female who complains of right shoulder pain.  She has had a long history that has undergone significant conservative management including injections and therapy.  A subsequent MRI was obtained which showed severe degenerative changes glenohumeral joint as well as full-thickness tears of the rotator cuff.  After discussing treatment options she did wish to proceed with operative intervention lines of a right shoulder reverse arthroplasty.  She presents today for that.  Past Medical History:  Diagnosis Date  . Anxiety    sees Dr Jerl Santos  psychiatrist every 3 month  . Arthritis   . Complication of anesthesia   . Depression   . DVT (deep venous thrombosis) ([place]) 2008   RIGHT /POST OP  . GERD (gastroesophageal reflux disease)   . H/O hiatal hernia   . Heart murmur 1970   MVP/ asympotmatic  . Hyperlipidemia   . 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 Neuropathy    BOTH FEET AND LEGS-WORSE ON LEFT SIDE  . Pain    LOWER BACK WITH PROLONGED BENDING OVER AND STANDING- S/P SPINAL FUSION JAN 2015.  Marland Kitchen Peripheral vascular disease ([place])    venous insufficiency/ bilateral legs  . Plantar fasciitis    LEFT  . PONV (postoperative nausea and vomiting)    NO PROBLEMS WITH N&V LAST COUPLE OF SURGERIES  . Pulmonary embolism ([place]) 2008  . Restless leg syndrome   . Scoliosis   . Seizures ([place])    last seizure 1967- states reaction to COMPAZINE  . Shortness of breath    WITH EXERTION   Past Surgical History:  Procedure Laterality Date  . ABDOMINAL HYSTERECTOMY     VAGINAL HYSTERECTOMY WITH BSO  . COLONOSCOPY    . EXTERNAL EAR SURGERY     "release of nerve behind right ear for bells palsy"  . EYE SURGERY     cataracts  . JOINT REPLACEMENT  2003/2008   bilateral knees  . KNEE ARTHROSCOPY     right knee  . KNEE ARTHROSCOPY Right  01/27/2014   Procedure: ARTHROSCOPY RIGHT KNEE WITH SYNOVECTOMY;  Surgeon: Gearlean Alf, MD;  Location: [facility];  Service: Orthopedics;  Laterality: Right;  . MAXIMUM ACCESS (MAS)POSTERIOR LUMBAR INTERBODY FUSION (PLIF) 2 LEVEL N/A 06/03/2013   Procedure: FOR MAXIMUM ACCESS (MAS) POSTERIOR LUMBAR INTERBODY FUSION (PLIF) 2 LEVEL;  Surgeon: Eustace Moore, MD;  Location: [facility];  Service: Neurosurgery;  Laterality: N/A;  FOR MAXIMUM ACCESS (MAS) POSTERIOR LUMBAR INTERBODY FUSION (PLIF) 2 LEVEL  . NISSEN FUNDOPLICATION  93/71  . TOTAL KNEE REVISION Right 07/02/2012   Procedure: TOTAL KNEE REVISION;  Surgeon: Gearlean Alf, MD;  Location: [facility];  Service: Orthopedics;  Laterality: Right;  . TUBAL LIGATION    . tummy tuck     Social History   Socioeconomic History  . Marital status: Widowed    Spouse name: Not on file  . Number of children: Not on file  . Years of education: Not on file  . Highest education level: Not on file  Occupational History  . Not on file  Social Needs  . Financial resource strain: Not on file  . Food insecurity    Worry: Not on file    Inability: Not on file  . Transportation needs    Medical: Not on file  Non-medical: Not on file  Tobacco Use  . Smoking status: Former Smoker    Packs/day: 0.25    Years: 10.00    Pack years: 2.50    Types: Cigarettes    Quit date: 06/25/1984    Years since quitting: 34.4  . Smokeless tobacco: Never Used  Substance and Sexual Activity  . Alcohol use: Yes    Comment: 1 glass wine week  . Drug use: No  . Sexual activity: Not on file  Lifestyle  . Physical activity    Days per week: Not on file    Minutes per session: Not on file  . Stress: Not on file  Relationships  . Social Herbalist on phone: Not on file    Gets together: Not on file    Attends religious service: Not on file    Active member of club or organization: Not on file    Attends meetings of clubs or organizations: Not on file     Relationship status: Not on file  Other Topics Concern  . Not on file  Social History Narrative  . Not on file   History reviewed. No pertinent family history. Allergies  Allergen Reactions  . Compazine [Prochlorperazine Edisylate] Other (See Comments)    Pt reports to having torticollis symptoms and seizure activity with compazine   Prior to Admission medications   Medication Sig Start Date End Date Taking? Authorizing Provider  ALPRAZolam Duanne Moron) 0.25 MG tablet Take 0.25 mg by mouth daily as needed for anxiety or sleep.  12/22/13  Yes [provider]  celecoxib (CELEBREX) 200 MG capsule Take 200 mg by mouth daily.    Yes [provider]  DULoxetine (CYMBALTA) 60 MG capsule Take 60 mg by mouth at bedtime.    Yes [provider]  ergocalciferol (VITAMIN D2) 1.25 MG (50000 UT) capsule Take 50,000 Units by mouth 2 (two) times a week.   Yes [provider]  gabapentin (NEURONTIN) 300 MG capsule TAKE 1 CAPSULE (300 MG TOTAL) BY MOUTH 3 (THREE) TIMES DAILY. Patient taking differently: Take 900 mg by mouth at bedtime.  06/07/14  Yes RegalTamala Fothergill, DPM  HYDROcodone-acetaminophen (NORCO) 7.5-325 MG tablet Take 1 tablet by mouth daily as needed for moderate pain.   Yes [provider]  Multiple Vitamins-Minerals (MULTIVITAMIN WITH MINERALS) tablet Take 1 tablet by mouth daily.    Yes [provider]  oxybutynin (DITROPAN-XL) 10 MG 24 hr tablet Take 10 mg by mouth at bedtime.   Yes [provider]  pantoprazole (PROTONIX) 40 MG tablet Take 40 mg by mouth daily.   Yes [provider]  polyethylene glycol (MIRALAX / GLYCOLAX) packet Take 17 g by mouth daily as needed for mild constipation.    Yes [provider]  pramipexole (MIRAPEX) 1 MG tablet Take 2 mg by mouth at bedtime.    Yes [provider]  traZODone (DESYREL) 100 MG tablet Take 100 mg by mouth at bedtime.  06/14/14  Yes [provider]  Wheat  Dextrin (BENEFIBER PO) Take 1 Scoop by mouth daily.   Yes [provider]   No results found.  Positive ROS: All other systems have been reviewed and were otherwise negative with the exception of those mentioned in the HPI and as above.  Physical Exam: General: Alert, no acute distress Cardiovascular: No pedal edema Respiratory: No cyanosis, no use of accessory musculature Skin: No lesions in the area of chief complaint Neurologic: Sensation intact distally  Psychiatric: Patient is competent for consent with normal mood and affect Lymphatic: No axillary or cervical lymphadenopathy  MUSCULOSKELETAL: Shoulders: Inspection Right: no atrophy, erythema, swelling, or scapular winging. Bony Palpation Right: no tenderness of the sternoclavicular joint, the clavicle, the coracoid process, the acromioclavicular joint, or the acromial and tenderness of the greater tuberosity and the bicipital groove. Active Range of Motion Right: forward flexion (120 deg.), extension (___ deg.), external rotation at 0 deg. of abduction (30 deg.), and internal rotation (T10 deg.). Passive Range of Motion Right: normal. Special Tests Right: Hawkin's test positive, Neer's test positive, O'Brien's test positive, and empty can sign positive and belly press negative. Strength Right: supraspinatus (empty can) with pain and internal rotation with pain and no deltoid weakness.  Assessment: 73 year old female with right shoulder rotator cuff tear arthropathy  Plan: Proceed to OR today for right shoulder reverse arthroplasty.  The risks of surgery were once again discussed with the patient which include but are not limited to infection, bleeding, damage to surrounding structures including blood vessels and nerves, pain, stiffness, implant failure, dislocation and need for additional procedures.  Consent was obtained.  We will plan on admitting her postoperatively for observation.  All of her questions were answered and she is  happy with this plan    Verner Mould, MD Cell [phone]   11/27/2018 7:04 AM

## 2018-11-27 NOTE — Transfer of Care (Signed)
Immediate Anesthesia Transfer of Care Note  Patient: Carrie Keller  Procedure(s) Performed: REVERSE SHOULDER ARTHROPLASTY (Right )  Patient Location: PACU  Anesthesia Type:General  Level of Consciousness: drowsy  Airway & Oxygen Therapy: Patient Spontanous Breathing and Patient connected to nasal cannula oxygen  Post-op Assessment: Report given to RN and Post -op Vital signs reviewed and stable  Post vital signs: Reviewed and stable  Last Vitals:  Vitals Value Taken Time  BP 117/62 11/27/18 0942  Temp    Pulse 81 11/27/18 0942  Resp 21 11/27/18 0942  SpO2 84 % 11/27/18 0942  Vitals shown include unvalidated device data.  Last Pain:  Vitals:   11/27/18 0557  TempSrc:   PainSc: 6       Patients Stated Pain Goal: 2 (57/97/28 2060)  Complications: No apparent anesthesia complications

## 2018-11-28 LAB — CBC
HCT: 38.7 % (ref 36.0–46.0)
Hemoglobin: 12 g/dL (ref 12.0–15.0)
MCH: 29.1 pg (ref 26.0–34.0)
MCHC: 31 g/dL (ref 30.0–36.0)
MCV: 93.9 fL (ref 80.0–100.0)
Platelets: 296 10*3/uL (ref 150–400)
RBC: 4.12 MIL/uL (ref 3.87–5.11)
RDW: 13.4 % (ref 11.5–15.5)
WBC: 13.2 10*3/uL — ABNORMAL HIGH (ref 4.0–10.5)
nRBC: 0 % (ref 0.0–0.2)

## 2018-11-28 LAB — BASIC METABOLIC PANEL
Anion gap: 10 (ref 5–15)
BUN: 10 mg/dL (ref 8–23)
CO2: 24 mmol/L (ref 22–32)
Calcium: 9.3 mg/dL (ref 8.9–10.3)
Chloride: 107 mmol/L (ref 98–111)
Creatinine, Ser: 0.76 mg/dL (ref 0.44–1.00)
GFR calc Af Amer: >60 mL/min (ref >60)
GFR calc non Af Amer: >60 mL/min (ref >60)
Glucose, Bld: 118 mg/dL — ABNORMAL HIGH (ref 70–99)
Potassium: 4.5 mmol/L (ref 3.5–5.1)
Sodium: 141 mmol/L (ref 135–145)

## 2018-11-28 MED ORDER — HYDROCODONE-ACETAMINOPHEN 7.5-325 MG PO TABS: 1.0000 | ORAL_TABLET | Freq: Four times a day (QID) | ORAL | 0 refills | Status: AC | PRN

## 2018-11-28 NOTE — Progress Notes (Signed)
   Ortho Hand Progress Note  Subjective: No acute events last night. Had some pain in the shoulder but improved this AM. Still with some tingling in the fingers from her block.   Objective: Vital signs in last 24 hours: Temp:  [97.4 F (36.3 C)-99.4 F (37.4 C)] 99.4 F (37.4 C) (07/17 0252) Pulse Rate:  [81-117] 92 (07/17 0252) Resp:  [15-20] 15 (07/17 0252) BP: (105-124)/(61-79) 108/65 (07/17 0252) SpO2:  [84 %-99 %] 96 % (07/17 0252)  Intake/Output from previous day: 07/16 0701 - 07/17 0700 In: 569.5 [P.O.:460; IV Piggyback:109.5] Out: 100 [Blood:100] Intake/Output this shift: No intake/output data recorded.  Recent Labs    11/28/18 0413  HGB 12.0   Recent Labs    11/28/18 0413  WBC 13.2*  RBC 4.12  HCT 38.7  PLT 296   Recent Labs    11/28/18 0413  NA 141  K 4.5  CL 107  CO2 24  BUN 10  CREATININE 0.76  GLUCOSE 118*  CALCIUM 9.3   No results for input(s): LABPT, INR in the last 72 hours.  aaox3 nad resp nonlabored rrr RUE: Sling in place. Dressing on anterior shoulder c/d/i. Intact motor to ain/pin/u. SILT m/u/r/a. Fingers wwp with bcr.  Assessment/Plan: 73 yo F with R shoulder rotator cuff tear arthropathy s/p R RSA, POD1 - Doing well - post op abx - regular diet - Sling at all times. NWB RUE - Pendulum exercises to the right arm - pt/ot - continue oral pain meds - OK for discharge home later today after pt/ot   Avanell Shackleton III 11/28/2018, 7:14 AM  [phone]

## 2018-11-28 NOTE — Progress Notes (Signed)
Physical Therapy Treatment Patient Details Name: Carrie Keller MRN: [id number] DOB: 08/21/1945 Today's Date: 11/28/2018    History of Present Illness Pt is a 73 y/o female s/p R reverse TSA. PMH includes DVT, PVD, neuropathy, scoliosis, and seizures.     PT Comments    Pt performed gait training and functional mobility during session.  She is overall supervision with intermittent response.  Her balance remains impaired but right response is intact.  Pt has cane at home for use if needed. Performed standing exercises with LUE counter support.  Plan for return home with follow up outpatient OT for shoulder.    Follow Up Recommendations  No PT follow up;Supervision for mobility/OOB     Equipment Recommendations  None recommended by PT    Recommendations for Other Services       Precautions / Restrictions Precautions Precautions: Fall Required Braces or Orthoses: Sling(on at all times) Restrictions Weight Bearing Restrictions: Yes RUE Weight Bearing: Non weight bearing    Mobility  Bed Mobility Overal bed mobility: Needs Assistance Bed Mobility: Supine to Sit     Supine to sit: Supervision     General bed mobility comments: Pt in sidelying on pull out couch in room but able to come to sitting unassisted.  Transfers Overall transfer level: Needs assistance Equipment used: None Transfers: Sit to/from Stand Sit to Stand: Supervision         General transfer comment: No LOB minor instability but able to right balance.  Ambulation/Gait Ambulation/Gait assistance: Min guard;Min assist Gait Distance (Feet): 250 Feet Assistive device: None Gait Pattern/deviations: Step-through pattern;Decreased stride length;Drifts right/left Gait velocity: Decreased    General Gait Details: Slow, mildly unsteady gait. Required min to min guard A for steadying assist throughout. Pt using IV pole for support.    Stairs             Wheelchair Mobility    Modified Rankin  (Stroke Patients Only)       Balance Overall balance assessment: Needs assistance Sitting-balance support: No upper extremity supported Sitting balance-Leahy Scale: Good     Standing balance support: No upper extremity supported;During functional activity Standing balance-Leahy Scale: Fair Standing balance comment: occasional LUE support during standing. Able to complete pendulum exercises in standing without significant LOB                            Cognition Arousal/Alertness: Awake/alert Behavior During Therapy: WFL for tasks assessed/performed Overall Cognitive Status: Within Functional Limits for tasks assessed                                        Exercises General Exercises - Lower Extremity Hip Flexion/Marching: AROM;Both;10 reps;Supine Heel Raises: AROM;Both;10 reps;Supine Mini-Sqauts: AROM;Both;10 reps;Supine Shoulder Exercises Pendulum Exercise: Right;10 reps;Standing Donning/doffing shirt without moving shoulder: Minimal assistance Method for sponge bathing under operated UE: Supervision/safety Donning/doffing sling/immobilizer: Minimal assistance Correct positioning of sling/immobilizer: Minimal assistance Pendulum exercises (written home exercise program): Minimal assistance ROM for elbow, wrist and digits of operated UE: Supervision/safety Sling wearing schedule (on at all times/off for ADL's): Supervision/safety Proper positioning of operated UE when showering: Minimal assistance Dressing change: Minimal assistance Positioning of UE while sleeping: Minimal assistance    General Comments General comments (skin integrity, edema, etc.): Pt requried cueing for adherence to precautions      Pertinent Vitals/Pain Pain Assessment: 0-10 Pain  Score: 4  Pain Location: R shoulder Pain Descriptors / Indicators: Aching;Sore Pain Intervention(s): Monitored during session;Repositioned    Home Living Family/patient expects to be  discharged to:: Private residence Living Arrangements: Alone Available Help at Discharge: Family;Available 24 hours/day Type of Home: House Home Access: Level entry   Home Layout: One level Home Equipment: Walker - 2 wheels;Cane - single point;Shower seat Additional Comments: Reports daughter will be coming to stay with pt    Prior Function Level of Independence: Independent          PT Goals (current goals can now be found in the care plan section) Acute Rehab PT Goals Patient Stated Goal: "get back to normal" Potential to Achieve Goals: Good Progress towards PT goals: Progressing toward goals    Frequency    Min 3X/week      PT Plan Current plan remains appropriate    Co-evaluation              AM-PAC PT "6 Clicks" Mobility   Outcome Measure  Help needed turning from your back to your side while in a flat bed without using bedrails?: A Little Help needed moving from lying on your back to sitting on the side of a flat bed without using bedrails?: A Little Help needed moving to and from a bed to a chair (including a wheelchair)?: A Little Help needed standing up from a chair using your arms (e.g., wheelchair or bedside chair)?: A Little Help needed to walk in hospital room?: A Little Help needed climbing 3-5 steps with a railing? : A Lot 6 Click Score: 17    End of Session Equipment Utilized During Treatment: Gait belt Activity Tolerance: Patient tolerated treatment well Patient left: in bed;with call bell/phone within reach;with bed alarm set Nurse Communication: Mobility status PT Visit Diagnosis: Unsteadiness on feet (R26.81)     Time: 6073-7106 PT Time Calculation (min) (ACUTE ONLY): 10 min  Charges:  $Gait Training: 8-22 mins                     Governor Rooks, PTA Acute Rehabilitation Services Pager [phone] Office [phone]     Latosha Gaylord Eli Hose 11/28/2018, 11:24 AM

## 2018-11-28 NOTE — Discharge Summary (Signed)
Patient ID: Carrie Keller MRN: [id number] DOB/AGE: 75/09/1945 75 y.o.  Admit date: 11/27/2018 Discharge date:   Admission Diagnoses: right shoulder post traumatic osteoarthritis, rotator cuff tear arthropathy Past Medical History:  Diagnosis Date  . Anxiety    sees Dr Jerl Santos  psychiatrist every 3 month  . Arthritis   . Complication of anesthesia   . Depression   . DVT (deep venous thrombosis) ([place]) 2008   RIGHT /POST OP  . GERD (gastroesophageal reflux disease)   . H/O hiatal hernia   . Heart murmur 1970   MVP/ asympotmatic  . Hyperlipidemia   . 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 Neuropathy    BOTH FEET AND LEGS-WORSE ON LEFT SIDE  . Pain    LOWER BACK WITH PROLONGED BENDING OVER AND STANDING- S/P SPINAL FUSION JAN 2015.  Marland Kitchen Peripheral vascular disease ([place])    venous insufficiency/ bilateral legs  . Plantar fasciitis    LEFT  . PONV (postoperative nausea and vomiting)    NO PROBLEMS WITH N&V LAST COUPLE OF SURGERIES  . Pulmonary embolism ([place]) 2008  . Restless leg syndrome   . Scoliosis   . Seizures ([place])    last seizure 1967- states reaction to COMPAZINE  . Shortness of breath    WITH EXERTION    Discharge Diagnoses:  Active Problems:   Rotator cuff tear arthropathy of right shoulder   Surgeries: Procedure(s): REVERSE SHOULDER ARTHROPLASTY on 11/27/2018    Consultants: PT/OT  Discharged Condition: Improved  Hospital Course: MERRIANNE MCCUMBERS is an 73 y.o. female who was admitted 11/27/2018 with a chief complaint of right shoulder pain, and found to have a diagnosis of right shoulder post traumatic osteoarthritis, rotator cuff tear arthropathy.  They were brought to the operating room on 11/27/2018 and underwent Procedure(s): Right REVERSE SHOULDER ARTHROPLASTY.    They were given perioperative antibiotics:  Anti-infectives (From admission, onward)   Start     Dose/Rate Route Frequency Ordered Stop   11/27/18 1400  ceFAZolin  (ANCEF) IVPB 1 g/50 mL premix     1 g 100 mL/hr over 30 Minutes Intravenous Every 8 hours 11/27/18 1115 11/28/18 1359   11/27/18 0600  ceFAZolin (ANCEF) IVPB 2g/100 mL premix     2 g 200 mL/hr over 30 Minutes Intravenous On call to O.R. 11/27/18 0540 11/27/18 0750   11/27/18 0600  vancomycin (VANCOCIN) IVPB 1000 mg/200 mL premix     1,000 mg 200 mL/hr over 60 Minutes Intravenous To ShortStay Surgical 11/27/18 0546 11/27/18 0800   11/27/18 0548  vancomycin (VANCOCIN) 1-5 GM/200ML-% IVPB    Note to Pharmacy: Gustavo Lah   : cabinet override      11/27/18 0548 11/27/18 0700    .  They were given sequential compression devices and early ambulation for DVT prophylaxis.  Recent vital signs:  Patient Vitals for the past 24 hrs:  BP Temp Temp src Pulse Resp SpO2  11/28/18 0252 108/65 99.4 F (37.4 C) Oral 92 15 96 %  11/27/18 1932 105/61 98.2 F (36.8 C) Oral (!) 117 16 92 %  11/27/18 1540 124/79 98 F (36.7 C) Oral (!) 103 18 (!) 84 %  11/27/18 1043 116/70 (!) 97.4 F (36.3 C) - 87 20 95 %  11/27/18 1029 120/74 97.6 F (36.4 C) - 81 20 99 %  11/27/18 1015 124/69 - - 83 19 94 %  11/27/18 1000 111/68 - - 84 18 97 %  11/27/18 0945 117/62 97.6 F (36.4 C) - 81 16 90 %  .  Recent laboratory studies: Dg Shoulder Right Port  Result Date: 11/27/2018 CLINICAL DATA:  Status post right shoulder replacement EXAM: PORTABLE RIGHT SHOULDER COMPARISON:  None. FINDINGS: The patient is status post right shoulder replacement. Postoperative air seen in the soft tissues. Hardware is in good position on the single frontal view. IMPRESSION: Patient is status post right shoulder replacement. No acute abnormalities otherwise seen. Electronically Signed   By: Dorise Bullion III M.D   On: 11/27/2018 10:17    Discharge Medications:   Allergies as of 11/28/2018      Reactions   Compazine [prochlorperazine Edisylate] Other (See Comments)   Pt reports to having torticollis symptoms and seizure activity with  compazine      Medication List    TAKE these medications   ALPRAZolam 0.25 MG tablet Commonly known as: XANAX Take 0.25 mg by mouth daily as needed for anxiety or sleep.   BENEFIBER PO Take 1 Scoop by mouth daily.   celecoxib 200 MG capsule Commonly known as: CELEBREX Take 200 mg by mouth daily.   DULoxetine 60 MG capsule Commonly known as: CYMBALTA Take 60 mg by mouth at bedtime.   ergocalciferol 1.25 MG (50000 UT) capsule Commonly known as: VITAMIN D2 Take 50,000 Units by mouth 2 (two) times a week.   gabapentin 300 MG capsule Commonly known as: NEURONTIN TAKE 1 CAPSULE (300 MG TOTAL) BY MOUTH 3 (THREE) TIMES DAILY. What changed: See the new instructions.   HYDROcodone-acetaminophen 7.5-325 MG tablet Commonly known as: NORCO Take 1-2 tablets by mouth every 6 (six) hours as needed for up to 7 days for moderate pain. What changed:   how much to take  when to take this   multivitamin with minerals tablet Take 1 tablet by mouth daily.   oxybutynin 10 MG 24 hr tablet Commonly known as: DITROPAN-XL Take 10 mg by mouth at bedtime.   pantoprazole 40 MG tablet Commonly known as: PROTONIX Take 40 mg by mouth daily.   polyethylene glycol 17 g packet Commonly known as: MIRALAX / GLYCOLAX Take 17 g by mouth daily as needed for mild constipation.   pramipexole 1 MG tablet Commonly known as: MIRAPEX Take 2 mg by mouth at bedtime.   traZODone 100 MG tablet Commonly known as: DESYREL Take 100 mg by mouth at bedtime.       Diagnostic Studies: Dg Shoulder Right Port  Result Date: 11/27/2018 CLINICAL DATA:  Status post right shoulder replacement EXAM: PORTABLE RIGHT SHOULDER COMPARISON:  None. FINDINGS: The patient is status post right shoulder replacement. Postoperative air seen in the soft tissues. Hardware is in good position on the single frontal view. IMPRESSION: Patient is status post right shoulder replacement. No acute abnormalities otherwise seen.  Electronically Signed   By: Dorise Bullion III M.D   On: 11/27/2018 10:17    On post operative day one. Her pain was well controlled. She was instructed on proper care of the right shoulder. They benefited maximally from their hospital stay and there were no complications.     Disposition: Discharge disposition: 01-Home or Self Care      Discharge Instructions    Call MD / Call 911   Complete by: As directed    If you experience chest pain or shortness of breath, CALL 911 and be transported to the hospital emergency room.  If you develope a fever above 101 F, pus (white drainage) or increased drainage  or redness at the wound, or calf pain, call your surgeon's office.   Constipation Prevention   Complete by: As directed    Drink plenty of fluids.  Prune juice may be helpful.  You may use a stool softener, such as Colace (over the counter) 100 mg twice a day.  Use MiraLax (over the counter) for constipation as needed.   Diet - low sodium heart healthy   Complete by: As directed    Increase activity slowly as tolerated   Complete by: As directed    Lifting restrictions   Complete by: As directed    Remain non-weight bearing on the right arm. Sling at all times.     Follow-up Information    Avanell Shackleton III, MD. Schedule an appointment as soon as possible for a visit in 14 day(s).   Contact information: [address] [place] [address] [phone]            Signed: Verner Mould, MD  11/28/2018, 7:26 AM

## 2018-11-28 NOTE — Plan of Care (Signed)
  Problem: Education: Goal: Knowledge of the prescribed therapeutic regimen will improve Outcome: Progressing   Problem: Activity: Goal: Ability to tolerate increased activity will improve Outcome: Progressing   Problem: Pain Management: Goal: Pain level will decrease with appropriate interventions Outcome: Progressing   

## 2018-11-28 NOTE — Evaluation (Signed)
Occupational Therapy Evaluation Patient Details Name: Carrie Keller MRN: [id number] DOB: [birth year] Today's Date: 11/28/2018    History of Present Illness Pt is a 73 y/o female s/p R reverse TSA. PMH includes DVT, PVD, neuropathy, scoliosis, and seizures.    Clinical Impression   Pt admitted s/o R reverse TSA. Pt was independent prior to admission and is familiar with rehab following replacement sx's, with hx of B TKA. Pt is motivated to return to her baseline, however is requiring min A for UB ADLs  2/2 shoulder precautions and restrictions. Pt required frequent cueing during session for adherence. Introduced pendulum exercises which pt returned demonstration of. Pt will require supervision from her daughter at home d/t slight balance deficits. Recommend OPOT at d/c for f/u on shoulder rehab.     Follow Up Recommendations  Outpatient OT;Follow surgeon's recommendation for DC plan and follow-up therapies    Equipment Recommendations  None recommended by OT       Precautions / Restrictions Precautions Precautions: Fall Required Braces or Orthoses: Sling(at all times) Restrictions Weight Bearing Restrictions: Yes RUE Weight Bearing: Non weight bearing      Mobility Bed Mobility Overal bed mobility: Needs Assistance Bed Mobility: Supine to Sit     Supine to sit: Min assist     General bed mobility comments: Min A for trunk elevation. Cues to get up on L side of bed to avoid bearing weight on RUE.   Transfers Overall transfer level: Needs assistance Equipment used: None Transfers: Sit to/from Stand Sit to Stand: Supervision         General transfer comment: occasional slight LOB in standing    Balance Overall balance assessment: Needs assistance Sitting-balance support: No upper extremity supported;Feet supported Sitting balance-Leahy Scale: Good     Standing balance support: No upper extremity supported;During functional activity Standing balance-Leahy Scale:  Fair Standing balance comment: occasional LUE support during standing. Able to complete pendulum exercises in standing without significant LOB                           ADL either performed or assessed with clinical judgement   ADL Overall ADL's : Needs assistance/impaired Eating/Feeding: Independent   Grooming: Wash/dry face;Oral care;Modified independent;Sitting   Upper Body Bathing: Supervision/ safety;Sitting Upper Body Bathing Details (indicate cue type and reason): cueing/edu for shoulder precautions Lower Body Bathing: Supervison/ safety;Sit to/from stand;Cueing for safety   Upper Body Dressing : Minimal assistance;Sitting;Cueing for UE precautions;Cueing for compensatory techniques Upper Body Dressing Details (indicate cue type and reason): min A to don sling Lower Body Dressing: Supervision/safety;Sit to/from stand;Cueing for safety   Toilet Transfer: Supervision/safety   Toileting- Clothing Manipulation and Hygiene: Supervision/safety;Sit to/from stand       Functional mobility during ADLs: Supervision/safety General ADL Comments: Pt required moderate cueing for adherence to shoulder precautions, moving R shoulder often despite cueing. Pt reporting no increase in pain     Vision Baseline Vision/History: Wears glasses Wears Glasses: At all times Patient Visual Report: No change from baseline Vision Assessment?: No apparent visual deficits            Pertinent Vitals/Pain Pain Assessment: 0-10 Pain Score: 4  Pain Location: R shoulder Pain Descriptors / Indicators: Aching;Sore Pain Intervention(s): Monitored during session;Relaxation     Hand Dominance Right   Extremity/Trunk Assessment Upper Extremity Assessment Upper Extremity Assessment: RUE deficits/detail RUE Deficits / Details: s/p R shoulder arthoplasty, pendulum only, no ROM RUE: Unable to fully assess  due to immobilization RUE Sensation: decreased light touch(numbness) RUE Coordination:  (unable to assess 2/2 precautions)   Lower Extremity Assessment Lower Extremity Assessment: Defer to PT evaluation   Cervical / Trunk Assessment Cervical / Trunk Assessment: Normal   Communication Communication Communication: No difficulties   Cognition Arousal/Alertness: Awake/alert Behavior During Therapy: WFL for tasks assessed/performed Overall Cognitive Status: Within Functional Limits for tasks assessed                                     General Comments  Pt requried cueing for adherence to precautions    Exercises Exercises: Shoulder Shoulder Exercises Pendulum Exercise: Right;10 reps;Standing   Shoulder Instructions Shoulder Instructions Donning/doffing shirt without moving shoulder: Minimal assistance Method for sponge bathing under operated UE: Supervision/safety Donning/doffing sling/immobilizer: Minimal assistance Correct positioning of sling/immobilizer: Minimal assistance Pendulum exercises (written home exercise program): Minimal assistance ROM for elbow, wrist and digits of operated UE: Supervision/safety Sling wearing schedule (on at all times/off for ADL's): Supervision/safety Proper positioning of operated UE when showering: Minimal assistance Dressing change: Minimal assistance Positioning of UE while sleeping: Minimal assistance    Home Living Family/patient expects to be discharged to:: Private residence Living Arrangements: Alone Available Help at Discharge: Family;Available 24 hours/day Type of Home: House Home Access: Level entry     Home Layout: One level     Bathroom Shower/Tub: Walk-in shower;Tub/shower unit   Bathroom Toilet: Standard Bathroom Accessibility: Yes How Accessible: Accessible via walker Home Equipment: Rice - 2 wheels;Cane - single point;Shower seat   Additional Comments: Reports daughter will be coming to stay with pt      Prior Functioning/Environment Level of Independence: Independent                  OT Problem List: Decreased strength;Decreased range of motion;Decreased activity tolerance;Impaired balance (sitting and/or standing);Decreased coordination;Decreased knowledge of use of DME or AE;Decreased knowledge of precautions;Impaired sensation      OT Treatment/Interventions: Self-care/ADL training;Therapeutic exercise;Patient/family education;Balance training;Therapeutic activities;Energy conservation;Splinting;Modalities;DME and/or AE instruction;Manual therapy    OT Goals(Current goals can be found in the care plan section) Acute Rehab OT Goals Patient Stated Goal: "get back to normal" OT Goal Formulation: With patient Time For Goal Achievement: 12/06/18 Potential to Achieve Goals: Good  OT Frequency: Min 2X/week   Barriers to D/C: Decreased caregiver support  daughter will stay for several days, she really needs to be staying with her for at least a week          AM-PAC OT "6 Clicks" Daily Activity     Outcome Measure Help from another person eating meals?: None Help from another person taking care of personal grooming?: None Help from another person toileting, which includes using toliet, bedpan, or urinal?: A Little Help from another person bathing (including washing, rinsing, drying)?: A Little Help from another person to put on and taking off regular upper body clothing?: A Little Help from another person to put on and taking off regular lower body clothing?: A Little 6 Click Score: 20   End of Session Equipment Utilized During Treatment: (shoulder immobilizer)  Activity Tolerance: Patient tolerated treatment well Patient left: in chair;with call bell/phone within reach  OT Visit Diagnosis: Pain;Muscle weakness (generalized) (M62.81) Pain - Right/Left: Right Pain - part of body: Shoulder                Time: 1941-7408 OT Time Calculation (min): 19 min Charges:  OT General Charges $OT Visit: 1 Visit OT Evaluation $OT Eval Low Complexity: 1 Low    Curtis Sites OTR/L  11/28/2018, 8:14 AM

## 2018-11-28 NOTE — Progress Notes (Signed)
Pt is ambulating to the hall. Right arm sling on at all times. Right shoulder dressing dry and intact.  Pain is controlled. Discharge instructions given to pt.  Discharged to home picked up by family.

## 2018-11-28 NOTE — Discharge Instructions (Signed)
Discharge Instructions  - Keep dressings in place for the next 5-7 days. You may remove the brown dressing at that point but leave the steri strips in place below. OK to shower over top of the current dressing. If the incision is clean and dry after the brown dressing has been removed it is also ok to shower and get the incisions slightly wet.  - Take all medication as prescribed. Transition to over the counter pain medication as your pain improves - Remain in your sling at all times. Perform gentle pendulum exercises twice a day. Be sure to move the elbow to prevent stiffness - Ice the shoulder for 15 minute intervals as needed during the day - Please call to schedule a follow up appointment with Dr. Jeannie Fend and therapy at [phone] for 10-14 days following surgery - Your pain medication have been send digitally to your pharmacy

## 2018-11-30 ENCOUNTER — Encounter (HOSPITAL_COMMUNITY): Payer: Self-pay | Admitting: Orthopaedic Surgery

## 2018-12-04 DIAGNOSIS — R930 Abnormal findings on diagnostic imaging of skull and head, not elsewhere classified: Secondary | ICD-10-CM | POA: Diagnosis not present

## 2018-12-04 DIAGNOSIS — R0683 Snoring: Secondary | ICD-10-CM | POA: Diagnosis not present

## 2018-12-04 DIAGNOSIS — R400 Somnolence: Secondary | ICD-10-CM | POA: Diagnosis not present

## 2018-12-04 DIAGNOSIS — R42 Dizziness and giddiness: Secondary | ICD-10-CM | POA: Diagnosis not present

## 2018-12-12 DIAGNOSIS — Z4789 Encounter for other orthopedic aftercare: Secondary | ICD-10-CM | POA: Diagnosis not present

## 2018-12-12 DIAGNOSIS — M19111 Post-traumatic osteoarthritis, right shoulder: Secondary | ICD-10-CM | POA: Diagnosis not present

## 2018-12-16 DIAGNOSIS — R5383 Other fatigue: Secondary | ICD-10-CM | POA: Diagnosis not present

## 2018-12-16 DIAGNOSIS — M7989 Other specified soft tissue disorders: Secondary | ICD-10-CM | POA: Diagnosis not present

## 2018-12-16 DIAGNOSIS — E538 Deficiency of other specified B group vitamins: Secondary | ICD-10-CM | POA: Diagnosis not present

## 2018-12-16 DIAGNOSIS — Z79899 Other long term (current) drug therapy: Secondary | ICD-10-CM | POA: Diagnosis not present

## 2018-12-16 DIAGNOSIS — F329 Major depressive disorder, single episode, unspecified: Secondary | ICD-10-CM | POA: Diagnosis not present

## 2018-12-16 DIAGNOSIS — F419 Anxiety disorder, unspecified: Secondary | ICD-10-CM | POA: Diagnosis not present

## 2018-12-23 ENCOUNTER — Other Ambulatory Visit: Payer: Self-pay | Admitting: Pharmacist

## 2018-12-23 DIAGNOSIS — Z6832 Body mass index (BMI) 32.0-32.9, adult: Secondary | ICD-10-CM | POA: Diagnosis not present

## 2018-12-23 DIAGNOSIS — Z9181 History of falling: Secondary | ICD-10-CM | POA: Diagnosis not present

## 2018-12-23 DIAGNOSIS — N959 Unspecified menopausal and perimenopausal disorder: Secondary | ICD-10-CM | POA: Diagnosis not present

## 2018-12-23 DIAGNOSIS — E785 Hyperlipidemia, unspecified: Secondary | ICD-10-CM | POA: Diagnosis not present

## 2018-12-23 DIAGNOSIS — Z1231 Encounter for screening mammogram for malignant neoplasm of breast: Secondary | ICD-10-CM | POA: Diagnosis not present

## 2018-12-23 DIAGNOSIS — Z0000 Encounter for general adult medical examination without abnormal findings: Secondary | ICD-10-CM | POA: Diagnosis not present

## 2018-12-23 DIAGNOSIS — Z1331 Encounter for screening for depression: Secondary | ICD-10-CM | POA: Diagnosis not present

## 2018-12-23 NOTE — Patient Outreach (Signed)
[facility]) Care Management  [place]   12/23/2018  Carrie Keller [birth year] [id number]  Reason for referral: Medication Assistance, Medication Review  Referral source: [employer] Utilization Management Department Current insurance: [employer]  PMHx includes but not limited to:  Anxiety / depression, GERD, HLD, arthritis, hx PE/DVT, RLS, scoliosis, s/p recent hospitalization for reverse shoulder rotator cuff tear arthroplasty on 7/16  Outreach:  Unsuccessful telephone call attempt #1 to patient. HIPAA compliant voicemail left requesting a return call  Plan:  -I will mail patient an unsuccessful outreach letter.  -I will make another outreach attempt to patient within 3-4 business days.    Ralene Bathe, PharmD, [place] [phone]

## 2018-12-26 ENCOUNTER — Ambulatory Visit: Payer: Self-pay | Admitting: Pharmacist

## 2018-12-26 ENCOUNTER — Other Ambulatory Visit: Payer: Self-pay | Admitting: Pharmacist

## 2018-12-26 NOTE — Patient Outreach (Signed)
[facility]) Care Management  [place]   12/26/2018  Carrie Keller Oct 03, 1945 [id number]  Reason for referral: Medication Assistance, Medication Management  Referral source: [place] Utilization Management Department Current insurance: [employer]  PMHx includes but not limited to:   Anxiety / depression, GERD, HLD, arthritis, hx PE/DVT, RLS, scoliosis, s/p recent hospitalization for reverse shoulder rotator cuff tear arthroplasty on 7/16  Outreach:  Successful telephone call with patient.  HIPAA identifiers verified.   Patient able to review medications telephonically. Patient reports difficulty paying for Cymbalta and another medication however phone call was disconnected / call lost during conversation.  Unsuccessful return call to patient. Message left requesting a return call.     Objective: The ASCVD Risk score Mikey Bussing DC Jr., et al., 2013) failed to calculate for the following reasons:   Cannot find a previous HDL lab   Cannot find a previous total cholesterol lab  Lab Results  Component Value Date   CREATININE 0.76 11/28/2018   CREATININE 0.62 11/17/2018   CREATININE 0.68 07/13/2014    No results found for: HGBA1C  Lipid Panel  No results found for: CHOL, TRIG, HDL, CHOLHDL, VLDL, LDLCALC, LDLDIRECT  BP Readings from Last 3 Encounters:  11/28/18 109/71  11/17/18 (!) 141/75  07/23/14 109/76    Allergies  Allergen Reactions  . Compazine [Prochlorperazine Edisylate] Other (See Comments)    Pt reports to having torticollis symptoms and seizure activity with compazine    Medications Reviewed Today    Reviewed by Rudean Haskell, RPH (Pharmacist) on 12/26/18 at [facility] List Status: <None>  Medication Order Taking? Sig Documenting Provider Last Dose Status Informant  ALPRAZolam (XANAX) 0.25 MG tablet [id number] Yes Take 0.25 mg by mouth daily as needed for anxiety or sleep.  [provider] Taking Active  Self           Med Note Sheryle Spray Jul 12, 2014  9:44 AM)    celecoxib (CELEBREX) 200 MG capsule [id number] Yes Take 200 mg by mouth daily.  [provider] Taking Active Self           Med Note Sheryle Spray Jul 12, 2014  9:44 AM)    DULoxetine (CYMBALTA) 60 MG capsule [id number] Yes Take 60 mg by mouth at bedtime.  [provider] Taking Active Self  ergocalciferol (VITAMIN D2) 1.25 MG (50000 UT) capsule [id number] No Take 50,000 Units by mouth 2 (two) times a week. [provider] Not Taking Active Self  gabapentin (NEURONTIN) 300 MG capsule [id number] Yes TAKE 1 CAPSULE (300 MG TOTAL) BY MOUTH 3 (THREE) TIMES DAILY.  Patient taking differently: Take 900 mg by mouth at bedtime.    Wallene Huh, DPM Taking Active Self  Multiple Vitamins-Minerals (MULTIVITAMIN WITH MINERALS) tablet [id number] Yes Take 1 tablet by mouth daily.  [provider] Taking Active Self  oxybutynin (DITROPAN-XL) 10 MG 24 hr tablet [id number] Yes Take 10 mg by mouth at bedtime. [provider] Taking Active Self  pantoprazole (PROTONIX) 40 MG tablet [id number] Yes Take 40 mg by mouth daily. [provider] Taking Active Self  polyethylene glycol (MIRALAX / GLYCOLAX) packet [id number] Yes Take 17 g by mouth daily as needed for mild constipation.  [provider] Taking Active Self  pramipexole (MIRAPEX) 1 MG tablet [id number] Yes Take 2 mg by mouth at bedtime.  [provider] Taking Active Self  traZODone (DESYREL) 100 MG  tablet [id number] Yes Take 100 mg by mouth at bedtime.  [provider] Taking Active Self           Med Note Wynona Canes   Tue Nov 11, 2018  1:09 PM)    Wheat Dextrin (BENEFIBER PO) [id number] Yes Take 1 Scoop by mouth daily. [provider] Taking Active Self          Assessment: Drugs sorted by system:  Neurologic/Psychologic: alprazolam, duloxetine, gabapentin, pramipexole,  trazodone  Gastrointestinal: pantoprazole, polyethylene glycol, benefiber  Pain: celecoxib, hydrocodone-APAP  Genitourinary: oxybutynin  Vitamins/Minerals/Supplements: MVI, vitamin D  Medication Review Findings:  . Alprazolam: caution with use in geriatric population due to CNS side effects, risk of falls . High dose of vitamin D 50,000, patient not currently taking, will confirm dose with PCP   Medication Assistance Findings:  Patient reports difficulty with co-pays but call ended before she could review specific medications.   Plan: . Will try to reach patient again next week re: medication assistance  Ralene Bathe, PharmD, [place] [phone]

## 2018-12-30 DIAGNOSIS — G629 Polyneuropathy, unspecified: Secondary | ICD-10-CM | POA: Diagnosis not present

## 2018-12-30 DIAGNOSIS — Z6832 Body mass index (BMI) 32.0-32.9, adult: Secondary | ICD-10-CM | POA: Diagnosis not present

## 2018-12-30 DIAGNOSIS — F419 Anxiety disorder, unspecified: Secondary | ICD-10-CM | POA: Diagnosis not present

## 2018-12-30 DIAGNOSIS — E039 Hypothyroidism, unspecified: Secondary | ICD-10-CM | POA: Diagnosis not present

## 2018-12-30 DIAGNOSIS — K5900 Constipation, unspecified: Secondary | ICD-10-CM | POA: Diagnosis not present

## 2018-12-30 DIAGNOSIS — F329 Major depressive disorder, single episode, unspecified: Secondary | ICD-10-CM | POA: Diagnosis not present

## 2018-12-30 DIAGNOSIS — E785 Hyperlipidemia, unspecified: Secondary | ICD-10-CM | POA: Diagnosis not present

## 2018-12-31 ENCOUNTER — Other Ambulatory Visit: Payer: Self-pay | Admitting: Pharmacist

## 2018-12-31 ENCOUNTER — Ambulatory Visit: Payer: Self-pay | Admitting: Pharmacist

## 2018-12-31 NOTE — Patient Outreach (Signed)
[facility]) Care Management  [place] 12/31/2018  Carrie Keller 16-May-1945 [id number]  Successful 2nd call attempt to [name] regarding medication assistance.  Patient reports she is having trouble paying for gabapentin, celecoxib, and pantoprazole.  Per review of [employer] formulary, gabapentin and pantoprazole are Tier 1, celecoxib is Tier 3.  Reviewed co-pays with Tier 1 medications with patient $5/30 days vs $10/90 days and Tier 3 medications $45/30 days vs $90/90 days.  Patient voiced understanding.  No patient assistance programs available for these medications.  Good RX coupon for celecoxib $19.53 at [employer].  Sent coupon to patient's mobile phone via text from website.  No further medication assistance programs for these medications that is lower cost than co-pays from [employer].  Patient appreciative of [facility] efforts and has no further questions.    Plan: Will close [place] case but am happy to assist in the future as needed.   Ralene Bathe, PharmD, [place] [phone]

## 2019-01-01 DIAGNOSIS — R2681 Unsteadiness on feet: Secondary | ICD-10-CM | POA: Diagnosis not present

## 2019-01-01 DIAGNOSIS — R739 Hyperglycemia, unspecified: Secondary | ICD-10-CM | POA: Diagnosis not present

## 2019-01-01 DIAGNOSIS — R930 Abnormal findings on diagnostic imaging of skull and head, not elsewhere classified: Secondary | ICD-10-CM | POA: Diagnosis not present

## 2019-01-09 DIAGNOSIS — M19111 Post-traumatic osteoarthritis, right shoulder: Secondary | ICD-10-CM | POA: Diagnosis not present

## 2019-01-09 DIAGNOSIS — Z4789 Encounter for other orthopedic aftercare: Secondary | ICD-10-CM | POA: Diagnosis not present

## 2019-01-13 DIAGNOSIS — Z96611 Presence of right artificial shoulder joint: Secondary | ICD-10-CM | POA: Diagnosis not present

## 2019-01-13 DIAGNOSIS — M25511 Pain in right shoulder: Secondary | ICD-10-CM | POA: Diagnosis not present

## 2019-01-13 DIAGNOSIS — M62521 Muscle wasting and atrophy, not elsewhere classified, right upper arm: Secondary | ICD-10-CM | POA: Diagnosis not present

## 2019-01-16 DIAGNOSIS — M62521 Muscle wasting and atrophy, not elsewhere classified, right upper arm: Secondary | ICD-10-CM | POA: Diagnosis not present

## 2019-01-16 DIAGNOSIS — E538 Deficiency of other specified B group vitamins: Secondary | ICD-10-CM | POA: Diagnosis not present

## 2019-01-16 DIAGNOSIS — Z96611 Presence of right artificial shoulder joint: Secondary | ICD-10-CM | POA: Diagnosis not present

## 2019-01-16 DIAGNOSIS — M25511 Pain in right shoulder: Secondary | ICD-10-CM | POA: Diagnosis not present

## 2019-01-16 DIAGNOSIS — M545 Low back pain: Secondary | ICD-10-CM | POA: Diagnosis not present

## 2019-01-20 DIAGNOSIS — Z96611 Presence of right artificial shoulder joint: Secondary | ICD-10-CM | POA: Diagnosis not present

## 2019-01-20 DIAGNOSIS — M25511 Pain in right shoulder: Secondary | ICD-10-CM | POA: Diagnosis not present

## 2019-01-20 DIAGNOSIS — M62521 Muscle wasting and atrophy, not elsewhere classified, right upper arm: Secondary | ICD-10-CM | POA: Diagnosis not present

## 2019-01-23 DIAGNOSIS — M25511 Pain in right shoulder: Secondary | ICD-10-CM | POA: Diagnosis not present

## 2019-01-23 DIAGNOSIS — Z96611 Presence of right artificial shoulder joint: Secondary | ICD-10-CM | POA: Diagnosis not present

## 2019-01-23 DIAGNOSIS — M62521 Muscle wasting and atrophy, not elsewhere classified, right upper arm: Secondary | ICD-10-CM | POA: Diagnosis not present

## 2019-01-28 DIAGNOSIS — M62521 Muscle wasting and atrophy, not elsewhere classified, right upper arm: Secondary | ICD-10-CM | POA: Diagnosis not present

## 2019-01-28 DIAGNOSIS — M25511 Pain in right shoulder: Secondary | ICD-10-CM | POA: Diagnosis not present

## 2019-01-28 DIAGNOSIS — Z96611 Presence of right artificial shoulder joint: Secondary | ICD-10-CM | POA: Diagnosis not present

## 2019-01-30 DIAGNOSIS — M62521 Muscle wasting and atrophy, not elsewhere classified, right upper arm: Secondary | ICD-10-CM | POA: Diagnosis not present

## 2019-01-30 DIAGNOSIS — Z96611 Presence of right artificial shoulder joint: Secondary | ICD-10-CM | POA: Diagnosis not present

## 2019-01-30 DIAGNOSIS — M25511 Pain in right shoulder: Secondary | ICD-10-CM | POA: Diagnosis not present

## 2019-02-03 DIAGNOSIS — Z96611 Presence of right artificial shoulder joint: Secondary | ICD-10-CM | POA: Diagnosis not present

## 2019-02-03 DIAGNOSIS — M25511 Pain in right shoulder: Secondary | ICD-10-CM | POA: Diagnosis not present

## 2019-02-03 DIAGNOSIS — M62521 Muscle wasting and atrophy, not elsewhere classified, right upper arm: Secondary | ICD-10-CM | POA: Diagnosis not present

## 2019-02-06 DIAGNOSIS — R0902 Hypoxemia: Secondary | ICD-10-CM | POA: Diagnosis not present

## 2019-02-16 DIAGNOSIS — M25511 Pain in right shoulder: Secondary | ICD-10-CM | POA: Diagnosis not present

## 2019-02-16 DIAGNOSIS — Z96611 Presence of right artificial shoulder joint: Secondary | ICD-10-CM | POA: Diagnosis not present

## 2019-02-16 DIAGNOSIS — M62521 Muscle wasting and atrophy, not elsewhere classified, right upper arm: Secondary | ICD-10-CM | POA: Diagnosis not present

## 2019-02-18 DIAGNOSIS — K5900 Constipation, unspecified: Secondary | ICD-10-CM | POA: Diagnosis not present

## 2019-02-18 DIAGNOSIS — G629 Polyneuropathy, unspecified: Secondary | ICD-10-CM | POA: Diagnosis not present

## 2019-02-18 DIAGNOSIS — M62521 Muscle wasting and atrophy, not elsewhere classified, right upper arm: Secondary | ICD-10-CM | POA: Diagnosis not present

## 2019-02-18 DIAGNOSIS — E538 Deficiency of other specified B group vitamins: Secondary | ICD-10-CM | POA: Diagnosis not present

## 2019-02-18 DIAGNOSIS — M25511 Pain in right shoulder: Secondary | ICD-10-CM | POA: Diagnosis not present

## 2019-02-18 DIAGNOSIS — F419 Anxiety disorder, unspecified: Secondary | ICD-10-CM | POA: Diagnosis not present

## 2019-02-18 DIAGNOSIS — G4734 Idiopathic sleep related nonobstructive alveolar hypoventilation: Secondary | ICD-10-CM | POA: Diagnosis not present

## 2019-02-18 DIAGNOSIS — Z96611 Presence of right artificial shoulder joint: Secondary | ICD-10-CM | POA: Diagnosis not present

## 2019-02-18 DIAGNOSIS — Z6832 Body mass index (BMI) 32.0-32.9, adult: Secondary | ICD-10-CM | POA: Diagnosis not present

## 2019-02-18 DIAGNOSIS — F329 Major depressive disorder, single episode, unspecified: Secondary | ICD-10-CM | POA: Diagnosis not present

## 2019-02-18 DIAGNOSIS — J449 Chronic obstructive pulmonary disease, unspecified: Secondary | ICD-10-CM | POA: Diagnosis not present

## 2019-02-20 DIAGNOSIS — Z4789 Encounter for other orthopedic aftercare: Secondary | ICD-10-CM | POA: Diagnosis not present

## 2019-02-20 DIAGNOSIS — M19111 Post-traumatic osteoarthritis, right shoulder: Secondary | ICD-10-CM | POA: Diagnosis not present

## 2019-02-23 DIAGNOSIS — M25511 Pain in right shoulder: Secondary | ICD-10-CM | POA: Diagnosis not present

## 2019-02-23 DIAGNOSIS — Z96611 Presence of right artificial shoulder joint: Secondary | ICD-10-CM | POA: Diagnosis not present

## 2019-02-23 DIAGNOSIS — M62521 Muscle wasting and atrophy, not elsewhere classified, right upper arm: Secondary | ICD-10-CM | POA: Diagnosis not present

## 2019-02-24 ENCOUNTER — Institutional Professional Consult (permissible substitution): Payer: PPO | Admitting: Neurology

## 2019-02-26 ENCOUNTER — Encounter: Payer: Self-pay | Admitting: Neurology

## 2019-02-27 DIAGNOSIS — M25511 Pain in right shoulder: Secondary | ICD-10-CM | POA: Diagnosis not present

## 2019-02-27 DIAGNOSIS — Z96611 Presence of right artificial shoulder joint: Secondary | ICD-10-CM | POA: Diagnosis not present

## 2019-02-27 DIAGNOSIS — M62521 Muscle wasting and atrophy, not elsewhere classified, right upper arm: Secondary | ICD-10-CM | POA: Diagnosis not present

## 2019-03-02 ENCOUNTER — Institutional Professional Consult (permissible substitution): Payer: PPO | Admitting: Neurology

## 2019-03-02 ENCOUNTER — Telehealth: Payer: Self-pay | Admitting: Neurology

## 2019-03-02 NOTE — Telephone Encounter (Signed)
Pt was a no show to apt today for sleep consult

## 2019-03-03 ENCOUNTER — Encounter: Payer: Self-pay | Admitting: Neurology

## 2019-03-03 ENCOUNTER — Other Ambulatory Visit: Payer: Self-pay

## 2019-03-03 ENCOUNTER — Ambulatory Visit: Payer: PPO | Admitting: Neurology

## 2019-03-03 VITALS — BP 138/84 | HR 91 | Temp 97.9°F | Ht 62.0 in | Wt 168.0 lb

## 2019-03-03 DIAGNOSIS — M41125 Adolescent idiopathic scoliosis, thoracolumbar region: Secondary | ICD-10-CM | POA: Diagnosis not present

## 2019-03-03 DIAGNOSIS — R0683 Snoring: Secondary | ICD-10-CM | POA: Diagnosis not present

## 2019-03-03 DIAGNOSIS — Z8719 Personal history of other diseases of the digestive system: Secondary | ICD-10-CM | POA: Diagnosis not present

## 2019-03-03 DIAGNOSIS — R053 Chronic cough: Secondary | ICD-10-CM

## 2019-03-03 DIAGNOSIS — I2782 Chronic pulmonary embolism: Secondary | ICD-10-CM

## 2019-03-03 DIAGNOSIS — R05 Cough: Secondary | ICD-10-CM

## 2019-03-03 DIAGNOSIS — G4734 Idiopathic sleep related nonobstructive alveolar hypoventilation: Secondary | ICD-10-CM

## 2019-03-03 DIAGNOSIS — Z9889 Other specified postprocedural states: Secondary | ICD-10-CM

## 2019-03-03 DIAGNOSIS — F419 Anxiety disorder, unspecified: Secondary | ICD-10-CM | POA: Diagnosis not present

## 2019-03-03 NOTE — Patient Instructions (Signed)

## 2019-03-03 NOTE — Progress Notes (Signed)
SLEEP MEDICINE CLINIC    Provider:  Larey Seat, MD  Primary Care Physician:  Renaldo Reel, PA [place] [address]     Referring Provider: Melony Overly, Md [address] [place],  [place] [address]          Chief Complaint according to patient   Patient presents with:     New Patient (Initial Visit)           HISTORY OF PRESENT ILLNESS:  Carrie Keller is a 73 y.o. year old  Caucasian female patient seen on 03/03/2019 .  Chief concern according to patient : " I am on night time oxygen and hate that machine but I sleep ok with it.- oxygen was ordered by NP Margarita Grizzle  about a month ago."    I have the pleasure of seeing Carrie Keller, a right-handed White or Caucasian female with a possible sleep disorder.   The patient's primary care provider, nurse practitioner in [place] but [place] stated that she had investigated the patient's concern about fragmented and nonrestorative sleep, waking up fatigued and being fatigued all day.  She checked an overnight pulse oximetry and based on that report was able to order oxygen at 2 L per nasal cannula.  The patient began treatment 5 weeks ago.  She also gets a B12 shot once a month.. There is a history of pulmonary embolism.  Pulmonary she has chronic kidney disease stage II, rotator cuff injuries, degenerative disc disease, osteoarthritis which is chronic and especially of rises at the cervical spine- her "back doctor is Dr. Sherley Bounds " , and iron deficiency anemia vitamin B12 deficiency she gets monthly shots.  Hyperlipidemia is also listed. Right shoulder surgery - currently in PT.  The patient is known to snore.  She has a past medical history of Anxiety, Arthritis, Complication of anesthesia, Depression, DVT (deep venous thrombosis) ([place]) (2008), GERD (gastroesophageal reflux disease), H/O hiatal hernia, Heart murmur (1970), Hyperlipidemia, Neuromuscular disorder ([place]), Neuropathy, Pain,  Peripheral vascular disease ([place]), Plantar fasciitis, PONV (postoperative nausea and vomiting), Pulmonary embolism ([place]) (2008), Restless leg syndrome, Scoliosis, Seizures ([place]), and Shortness of breath.   Sleep relevant medical history: Nocturia 2-6 times,  No neck trauma  ,but stiffness.   Family medical /sleep history: No other family member on CPAP with OSA, insomnia, brother was a sleep walker.    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 She lives in a household with alone. Family status is widowed, with one adult daughter, stepchildren.  Pets : 1 dog. Tobacco use quit in 1986.  ETOH use : socially Caffeine intake in form of Coffee( 1 cup) Soda( none ) Tea ( green/ 3 week) .Regular exercise in form of walking .  Hobbies : dog.   Sleep habits are as follows: The patient's dinner time is between 5-6 PM. The patient goes to bed at 10 PM and once asleep she continues to sleep for 2 hours, wakes for many bathroom breaks, the first time at 0 AM.   The preferred sleep position is supine, with the support of 2 pillows.  Dreams are reportedly rare.  6 AM is the usual rise time. The patient wakes up spntaneously.   She reports not feeling refreshed or restored in AM, with symptoms such as dry mouth, morning headaches , dry nose, sinus irritation,  and postnasal  drip and residual fatigue.  Non -scheduled naps are taken frequently,but when she rests she is asleep- and stays asleep 20 minutes.    Review of Systems: Out of a complete 14 system review, the patient complains of only the following symptoms, and all other reviewed systems are negative.:  Excessive daytime sleepiness , snoring, fragmented sleep, Nocturia, fatigue and  hypoxemia.  On hydrocodone since surgery in July.   How likely are you to doze in the following situations: 0 = not likely, 1 = slight chance, 2 = moderate chance, 3 = high  chance   Sitting and Reading? Watching Television? Sitting inactive in a public place (theater or meeting)? As a passenger in a car for an hour without a break? Lying down in the afternoon when circumstances permit? Sitting and talking to someone? Sitting quietly after lunch without alcohol? In a car, while stopped for a few minutes in traffic?   Total = 16/ 24 points  Even on oxygen high sleepiness, struggles to stay wake when reading, when driving !!!  FSS endorsed at 36/ 63 points.   Social History   Socioeconomic History   Marital status: Widowed    Spouse name: Not on file   Number of children: Not on file   Years of education: Not on file   Highest education level: Not on file  Occupational History   Not on file  Social Needs   Financial resource strain: Not on file   Food insecurity    Worry: Not on file    Inability: Not on file   Transportation needs    Medical: Not on file    Non-medical: Not on file  Tobacco Use   Smoking status: Former Smoker    Packs/day: 0.25    Years: 10.00    Pack years: 2.50    Types: Cigarettes    Quit date: 06/25/1984    Years since quitting: 34.7   Smokeless tobacco: Never Used  Substance and Sexual Activity   Alcohol use: Yes    Comment: 1 glass wine week   Drug use: No   Sexual activity: Not on file  Lifestyle   Physical activity    Days per week: Not on file    Minutes per session: Not on file   Stress: Not on file  Relationships   Social connections    Talks on phone: Not on file    Gets together: Not on file    Attends religious service: Not on file    Active member of club or organization: Not on file    Attends meetings of clubs or organizations: Not on file    Relationship status: Not on file  Other Topics Concern   Not on file  Social History Narrative   Not on file    Family History  Problem Relation Age of Onset   Heart disease Mother    Prostate cancer Father    Pancreatic cancer  Father    Hypertension Father    Diabetes Father    Alcohol abuse Sister    Hypertension Brother    Liver cancer Paternal Uncle    Colon cancer Paternal Grandmother     Past Medical History:  Diagnosis Date   Anxiety    sees Dr Jerl Santos  psychiatrist every 3 month   Arthritis    Complication of anesthesia    Depression    DVT (deep venous thrombosis) ([facility]) 2008   RIGHT /POST OP   GERD (gastroesophageal reflux disease)  H/O hiatal hernia    Heart murmur 1970   MVP/ asympotmatic   Hyperlipidemia    Neuromuscular disorder ([place])    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   Peripheral vascular disease ([facility])    venous insufficiency/ bilateral legs   Plantar fasciitis    LEFT   PONV (postoperative nausea and vomiting)    NO PROBLEMS WITH N&V LAST COUPLE OF SURGERIES   Pulmonary embolism ([place]) 2008   Restless leg syndrome    Scoliosis    Seizures ([place])    last seizure 1967- states reaction to COMPAZINE   Shortness of breath    WITH EXERTION    Past Surgical History:  Procedure Laterality Date   ABDOMINAL HYSTERECTOMY     VAGINAL HYSTERECTOMY WITH BSO   COLONOSCOPY     EXTERNAL EAR SURGERY     "release of nerve behind right ear for bells palsy"   EYE SURGERY     cataracts   JOINT REPLACEMENT  2003/2008   bilateral knees   KNEE ARTHROSCOPY     right knee   KNEE ARTHROSCOPY Right 01/27/2014   Procedure: ARTHROSCOPY RIGHT KNEE WITH SYNOVECTOMY;  Surgeon: Gearlean Alf, MD;  Location: [facility];  Service: Orthopedics;  Laterality: Right;   MAXIMUM ACCESS (MAS)POSTERIOR LUMBAR INTERBODY FUSION (PLIF) 2 LEVEL N/A 06/03/2013   Procedure: FOR MAXIMUM ACCESS (MAS) POSTERIOR LUMBAR INTERBODY FUSION (PLIF) 2 LEVEL;  Surgeon: Eustace Moore, MD;  Location: [facility];  Service: Neurosurgery;  Laterality:  N/A;  FOR MAXIMUM ACCESS (MAS) POSTERIOR LUMBAR INTERBODY FUSION (PLIF) 2 LEVEL   NISSEN FUNDOPLICATION  [id number]   REVERSE SHOULDER ARTHROPLASTY Right 11/27/2018   Procedure: REVERSE SHOULDER ARTHROPLASTY;  Surgeon: Verner Mould, MD;  Location: [facility];  Service: Orthopedics;  Laterality: Right;  139min   TOTAL KNEE REVISION Right 07/02/2012   Procedure: TOTAL KNEE REVISION;  Surgeon: Gearlean Alf, MD;  Location: [facility];  Service: Orthopedics;  Laterality: Right;   TUBAL LIGATION     tummy tuck       Current Outpatient Medications on File Prior to Visit  Medication Sig Dispense Refill   ALPRAZolam (XANAX) 0.25 MG tablet Take 0.25 mg by mouth daily as needed for anxiety or sleep.      celecoxib (CELEBREX) 200 MG capsule Take 200 mg by mouth daily.      ergocalciferol (VITAMIN D2) 1.25 MG (50000 UT) capsule Take 50,000 Units by mouth 2 (two) times a week.     ferrous sulfate 325 (65 FE) MG EC tablet Take 325 mg by mouth 3 (three) times daily with meals.     gabapentin (NEURONTIN) 300 MG capsule TAKE 1 CAPSULE (300 MG TOTAL) BY MOUTH 3 (THREE) TIMES DAILY. (Patient taking differently: Take 900 mg by mouth at bedtime. ) 90 capsule 3   HYDROcodone-acetaminophen (NORCO) 7.5-325 MG tablet Take 1 tablet by mouth every 6 (six) hours as needed for moderate pain.     meclizine (ANTIVERT) 25 MG tablet Take 25 mg by mouth 3 (three) times daily as needed for dizziness.     Multiple Vitamins-Minerals (MULTIVITAMIN WITH MINERALS) tablet Take 1 tablet by mouth daily.      omeprazole (PRILOSEC) 40 MG capsule Take by mouth daily.     ondansetron (ZOFRAN) 8 MG tablet  Take by mouth every 8 (eight) hours as needed for nausea or vomiting.     oxybutynin (DITROPAN-XL) 10 MG 24 hr tablet Take 10 mg by mouth at bedtime.     pantoprazole (PROTONIX) 40 MG tablet Take 40 mg by mouth daily.     polyethylene glycol (MIRALAX / GLYCOLAX) packet Take 17 g by mouth daily as needed for mild  constipation.      pramipexole (MIRAPEX) 1 MG tablet Take 2 mg by mouth at bedtime.      pravastatin (PRAVACHOL) 80 MG tablet Take 80 mg by mouth daily.     traZODone (DESYREL) 100 MG tablet Take 100 mg by mouth at bedtime.   2   Wheat Dextrin (BENEFIBER PO) Take 1 Scoop by mouth daily.     No current facility-administered medications on file prior to visit.     Allergies  Allergen Reactions   Compazine [Prochlorperazine Edisylate] Other (See Comments)    Pt reports to having torticollis symptoms and seizure activity with compazine   Prochlorperazine Other (See Comments)    Pt reports to having torticollis symptoms and seizure activity with compazine   Phenergan [Promethazine] Other (See Comments)    Seizures     Physical exam:  Keller's Vitals   03/03/19 1048  BP: 138/84  Pulse: 91  Temp: 97.9 F (36.6 C)  Weight: 168 lb (76.2 kg)  Height: 5\' 2"  (1.575 m)   Body mass index is 30.73 kg/m.   Wt Readings from Last 3 Encounters:  03/03/19 168 lb (76.2 kg)  11/27/18 170 lb (77.1 kg)  11/17/18 173 lb 4.8 oz (78.6 kg)     Ht Readings from Last 3 Encounters:  03/03/19 5\' 2"  (1.575 m)  11/27/18 5\' 1"  (1.549 m)  11/17/18 5\' 1"  (1.549 m)      General: The patient is awake, alert and appears not in acute distress. The patient is well groomed. Head: Normocephalic, atraumatic. Neck is supple. Mallampati: 3  15.25  " neck circumference .  Nasal airflow congested .  Retrognathia is not seen.  Dental status: poor  Cardiovascular:  Regular rate and cardiac rhythm by pulse, without distended neck veins. Respiratory: Lungs are clear to auscultation.  Skin:  Without evidence of ankle edema, or rash. Trunk: The patient's posture is erect.   Neurologic exam : The patient is awake and alert, oriented to place and time.   Memory subjective described as intact.  Attention span & concentration ability appears normal.  Speech is fluent,  without  dysarthria, dysphonia or  aphasia.  Mood and affect are appropriate.   Cranial nerves: no loss of smell or taste reported. Pupils are equal and briskly reactive to light. Funduscopic exam deferred.   Extraocular movements in vertical and horizontal planes were intact and without nystagmus. No Diplopia. Visual fields by finger perimetry are intact. Hearing was intact to soft voice and finger rubbing.  Facial sensation intact to fine touch. Facial motor strength is symmetric and tongue and uvula move midline.  Neck ROM : rotation, tilt and flexion extension were normal for age and shoulder shrug was symmetrical.  Motor exam:  Symmetric bulk, tone and ROM.   Normal tone without cog wheeling, symmetric grip strength . Sensory:  Fine touch, pinprick and vibration were tested  and  normal.  Proprioception tested in the upper extremities was normal. Coordination: Rapid alternating movements in the fingers/hands were of normal speed.  The Finger-to-nose maneuver was intact without evidence of ataxia, dysmetria or tremor.  Gait and station: Patient could rise by bracing herself, unassisted from a seated position, walked without assistive device.  Stance is of normal width/ base and the patient turned with 3 steps, and fluent movements. .  Toe and heel walk were deferred.  Deep tendon reflexes: in the  upper and lower extremities are symmetric and intact.  Babinski response was deferred .      After spending a total time of 45 minutes face to face and additional time for physical and neurologic examination, review of laboratory studies,  personal review of imaging studies, reports and results of other testing and review of referral information / records as far as provided in visit, I have established the following assessments:  1) I feel it is important to make sure that Mrs. Wakes sleep hypoxemia was not just a manifestation of sleep apnea.  She definitely has higher risk factors partially due to her scoliosis, her history of  pulmonary embolism, her slight elevation in body mass index, and a remote history of cigarette smoking.  Also some of her medications especially those takes oxycodone will suppress breathing and may allow for hypoxemia.  I will ask the overnight sleep study for the patient better than a home sleep test I hope that will be permitted by her insurance.  During the Covid crisis we did home sleep test only we have open to sleep lab since July 1.    My Plan is to proceed with:  1) attended sleep study SPLIT night.   I would like to thank Renaldo Reel, PA and Melony Overly, Md [address] [place],   [address] for allowing me to meet with and to take care of this pleasant patient.   In short, Carrie Keller is presenting with PE, mild COPD and scoliosis and documented hypoxemia in sleep, as well as snoring. - now she reports being extremely sleepy and fatigued- symptoms that can be attributed to sleep disordered breathing..   I plan to follow up either personally or through our NP within 2-3  month.   CC: I will share my notes with PCP.  Electronically signed by: Larey Seat, MD 03/03/2019 11:19 AM  [facility] Board certified by The [employer] of Sleep Medicine and Diplomate of the [employer] of Sleep Medicine. Board certified In Neurology through the [employer], Fellow of the [employer] of Neurology. Medical Director of [facility].

## 2019-03-04 DIAGNOSIS — M25511 Pain in right shoulder: Secondary | ICD-10-CM | POA: Diagnosis not present

## 2019-03-04 DIAGNOSIS — Z96611 Presence of right artificial shoulder joint: Secondary | ICD-10-CM | POA: Diagnosis not present

## 2019-03-04 DIAGNOSIS — M62521 Muscle wasting and atrophy, not elsewhere classified, right upper arm: Secondary | ICD-10-CM | POA: Diagnosis not present

## 2019-03-08 DIAGNOSIS — R0902 Hypoxemia: Secondary | ICD-10-CM | POA: Diagnosis not present

## 2019-03-10 DIAGNOSIS — M62521 Muscle wasting and atrophy, not elsewhere classified, right upper arm: Secondary | ICD-10-CM | POA: Diagnosis not present

## 2019-03-10 DIAGNOSIS — M25511 Pain in right shoulder: Secondary | ICD-10-CM | POA: Diagnosis not present

## 2019-03-10 DIAGNOSIS — Z96611 Presence of right artificial shoulder joint: Secondary | ICD-10-CM | POA: Diagnosis not present

## 2019-03-13 DIAGNOSIS — M62521 Muscle wasting and atrophy, not elsewhere classified, right upper arm: Secondary | ICD-10-CM | POA: Diagnosis not present

## 2019-03-13 DIAGNOSIS — Z96611 Presence of right artificial shoulder joint: Secondary | ICD-10-CM | POA: Diagnosis not present

## 2019-03-13 DIAGNOSIS — M25511 Pain in right shoulder: Secondary | ICD-10-CM | POA: Diagnosis not present

## 2019-03-15 ENCOUNTER — Ambulatory Visit (INDEPENDENT_AMBULATORY_CARE_PROVIDER_SITE_OTHER): Payer: PPO | Admitting: Neurology

## 2019-03-15 ENCOUNTER — Other Ambulatory Visit: Payer: Self-pay

## 2019-03-15 DIAGNOSIS — G4733 Obstructive sleep apnea (adult) (pediatric): Secondary | ICD-10-CM

## 2019-03-15 DIAGNOSIS — G4734 Idiopathic sleep related nonobstructive alveolar hypoventilation: Secondary | ICD-10-CM | POA: Diagnosis not present

## 2019-03-15 DIAGNOSIS — Z9889 Other specified postprocedural states: Secondary | ICD-10-CM

## 2019-03-15 DIAGNOSIS — Z8719 Personal history of other diseases of the digestive system: Secondary | ICD-10-CM

## 2019-03-15 DIAGNOSIS — R053 Chronic cough: Secondary | ICD-10-CM

## 2019-03-15 DIAGNOSIS — F419 Anxiety disorder, unspecified: Secondary | ICD-10-CM

## 2019-03-15 DIAGNOSIS — M41125 Adolescent idiopathic scoliosis, thoracolumbar region: Secondary | ICD-10-CM

## 2019-03-15 DIAGNOSIS — R0683 Snoring: Secondary | ICD-10-CM

## 2019-03-15 DIAGNOSIS — I2782 Chronic pulmonary embolism: Secondary | ICD-10-CM

## 2019-03-15 DIAGNOSIS — R05 Cough: Secondary | ICD-10-CM

## 2019-03-17 DIAGNOSIS — M62521 Muscle wasting and atrophy, not elsewhere classified, right upper arm: Secondary | ICD-10-CM | POA: Diagnosis not present

## 2019-03-17 DIAGNOSIS — M25511 Pain in right shoulder: Secondary | ICD-10-CM | POA: Diagnosis not present

## 2019-03-17 DIAGNOSIS — Z96611 Presence of right artificial shoulder joint: Secondary | ICD-10-CM | POA: Diagnosis not present

## 2019-03-19 DIAGNOSIS — M62521 Muscle wasting and atrophy, not elsewhere classified, right upper arm: Secondary | ICD-10-CM | POA: Diagnosis not present

## 2019-03-19 DIAGNOSIS — M25511 Pain in right shoulder: Secondary | ICD-10-CM | POA: Diagnosis not present

## 2019-03-19 DIAGNOSIS — Z96611 Presence of right artificial shoulder joint: Secondary | ICD-10-CM | POA: Diagnosis not present

## 2019-03-23 DIAGNOSIS — E538 Deficiency of other specified B group vitamins: Secondary | ICD-10-CM | POA: Diagnosis not present

## 2019-03-24 DIAGNOSIS — Z96611 Presence of right artificial shoulder joint: Secondary | ICD-10-CM | POA: Diagnosis not present

## 2019-03-24 DIAGNOSIS — M62521 Muscle wasting and atrophy, not elsewhere classified, right upper arm: Secondary | ICD-10-CM | POA: Diagnosis not present

## 2019-03-24 DIAGNOSIS — M25511 Pain in right shoulder: Secondary | ICD-10-CM | POA: Diagnosis not present

## 2019-03-25 DIAGNOSIS — M25511 Pain in right shoulder: Secondary | ICD-10-CM | POA: Diagnosis not present

## 2019-03-25 DIAGNOSIS — M62521 Muscle wasting and atrophy, not elsewhere classified, right upper arm: Secondary | ICD-10-CM | POA: Diagnosis not present

## 2019-03-25 DIAGNOSIS — Z96611 Presence of right artificial shoulder joint: Secondary | ICD-10-CM | POA: Diagnosis not present

## 2019-03-27 DIAGNOSIS — Z1231 Encounter for screening mammogram for malignant neoplasm of breast: Secondary | ICD-10-CM | POA: Diagnosis not present

## 2019-03-27 DIAGNOSIS — N959 Unspecified menopausal and perimenopausal disorder: Secondary | ICD-10-CM | POA: Diagnosis not present

## 2019-03-27 DIAGNOSIS — K589 Irritable bowel syndrome without diarrhea: Secondary | ICD-10-CM | POA: Diagnosis not present

## 2019-03-27 DIAGNOSIS — M2550 Pain in unspecified joint: Secondary | ICD-10-CM | POA: Diagnosis not present

## 2019-03-27 DIAGNOSIS — Z13820 Encounter for screening for osteoporosis: Secondary | ICD-10-CM | POA: Diagnosis not present

## 2019-03-30 ENCOUNTER — Telehealth: Payer: Self-pay | Admitting: Neurology

## 2019-03-30 DIAGNOSIS — M41125 Adolescent idiopathic scoliosis, thoracolumbar region: Secondary | ICD-10-CM | POA: Insufficient documentation

## 2019-03-30 DIAGNOSIS — G4734 Idiopathic sleep related nonobstructive alveolar hypoventilation: Secondary | ICD-10-CM | POA: Insufficient documentation

## 2019-03-30 DIAGNOSIS — R0683 Snoring: Secondary | ICD-10-CM | POA: Insufficient documentation

## 2019-03-30 DIAGNOSIS — I2782 Chronic pulmonary embolism: Secondary | ICD-10-CM | POA: Insufficient documentation

## 2019-03-30 NOTE — Procedures (Signed)
PATIENT'S NAME:  Carrie Keller, Carrie Keller DOB:      Sep 12, 1945      MRN:    [id number]  [facility]    DATE OF RECORDING: 03/15/2019 REFERRING M.D.:  Quintella Reichert, MD Study Performed:   Baseline Polysomnogram HISTORY:  Carrie Keller is a 73- year- old Caucasian female patient seen on 03/03/2019 in consultation. Chief concern according to patient: " I am on night time oxygen and hate that machine but I sleep ok with it.- Oxygen was ordered by NP Margarita Grizzle  about a month ago." The patient's primary care provider, nurse practitioner in [place] stated that she had investigated the patient's concern about fragmented and nonrestorative sleep, waking up fatigued and being fatigued all day.  She checked an overnight pulse oximetry and based on that report was able to order oxygen at 2 L per nasal cannula.  The patient began treatment 5 weeks ago.  She also gets a B12 shot once a month.. There is a history of pulmonary embolism, she has chronic kidney disease stage II, rotator cuff injuries, degenerative disc disease, osteoarthritis which is chronic and especially of rises at the cervical spine- her "back doctor is Dr. Sherley Bounds " and iron deficiency anemia. Hyperlipidemia is also listed. Right shoulder surgery - currently in PT.  The patient is known to snore.  She has a past medical history of Anxiety, Arthritis, Complication of anesthesia, Depression, DVT (deep venous thrombosis) ([place]) (2008), GERD (gastroesophageal reflux disease), H/O hiatal hernia, Heart murmur (1970), Hyperlipidemia, Neuromuscular disorder ([place]), Neuropathy, Pain, Peripheral vascular disease ([place]), Plantar fasciitis, PONV (postoperative nausea and vomiting), Pulmonary embolism ([place]) (2008), Restless leg syndrome, Scoliosis, Seizures ([place]), and Shortness of breath.  Sleep relevant medical history: Nocturia up to 6 times. Family medical /sleep history: No other family member with OSA, insomnia, but her brother was a sleep walker.   She reports not feeling  refreshed or restored in AM, with symptoms such as dry mouth, morning headaches, dry nose, sinus irritation, postnasal drip and residual fatigue.  Non -scheduled naps are taken frequently, but when she rests she is asleep- and stays asleep 20 minutes.    The patient endorsed the Epworth Sleepiness Scale at 16/24 points.   The patient's weight 168 pounds with a height of 62 (inches), resulting in a BMI of 30.8 kg/m2. The patient's neck circumference measured 15.2 inches.  CURRENT MEDICATIONS: Xanax, Celebrex, Vitamin D2, Fe sulfate, Neurontin, Norco, Antivert, Multivitamin, Prilosec, Zofran, Ditropan XL, Protonix, Miralax, Mirapex, Pravachol, Desyrel, Benefiber.   PROCEDURE:  This is a multichannel digital polysomnogram utilizing the Somnostar 11.2 system.  Electrodes and sensors were applied and monitored per AASM Specifications.   EEG, EOG, Chin and Limb EMG, were sampled at 200 Hz.  ECG, Snore and Nasal Pressure, Thermal Airflow, Respiratory Effort, CPAP Flow and Pressure, Oximetry was sampled at 50 Hz. Digital video and audio were recorded.      BASELINE STUDY: Lights Out was at 21:51 and Lights On at 05:03.  Total recording time (TRT) was 433 minutes, with a total sleep time (TST) of 411.5 minutes.   The patient's sleep latency was 13.5 minutes.  REM latency was 43.5 minutes.  The sleep efficiency was 95.0 %.     SLEEP ARCHITECTURE: WASO (Wake after sleep onset) was 7.5 minutes.  There were 5 minutes in Stage N1, 216.5 minutes Stage N2, 99.5 minutes Stage N3 and 90.5 minutes in Stage REM.  The percentage of Stage N1 was 1.2%, Stage N2 was 52.6%, Stage N3 was  24.2% and Stage R (REM sleep) was 22.0%.  RESPIRATORY ANALYSIS:  There were a total of 159 respiratory events:  70 obstructive apneas and 89 hypopneas.     The total APNEA/HYPOPNEA INDEX (AHI) was 23.2 /hour. 86 events occurred in REM sleep and 114 events in NREM. The REM AHI was 57.0 /hour, versus a non-REM AHI of 13.6/h.  The patient spent  411.5 minutes of total sleep time in the supine position and 0 minutes in non-supine. The supine AHI was 23.2/h.  OXYGEN SATURATION & C02:  The Wake baseline 02 saturation was 87%, with the lowest being 65%. Time spent below 89% saturation equaled 321 minutes.   AROUSALS:  The arousals were noted as: 36 were spontaneous, 0 were associated with PLMs, and 31 were associated with respiratory events. The patient had a total of 0 Periodic Limb Movements. Audio and video analysis did not show any abnormal or unusual movements, complex behaviors, phonations or vocalizations.   EKG in normal sinus rhythm (NSR).  IMPRESSION:  1. Clear evidence of moderate degree of Obstructive Sleep Apnea (OSA). 2. There is severe hypoxemia, exceeding the expected apnea related hypoxemia.  3. No Periodic Limb Movement Disorder (PLMD)  RECOMMENDATIONS:  1. Advise full-night, attended, CPAP titration study to optimize therapy. The patient has been apprehensive about oxygen and CPAP- Goal is to document that CPAP can improve the oxygenation sufficiently and if not to titrate oxygen, allowing for prescription.    I certify that I have reviewed the entire raw data recording prior to the issuance of this report in accordance with the Standards of Accreditation of the American Academy of Sleep Medicine (AASM)   Larey Seat, MD  03-30-2019 Diplomat, American Board of Psychiatry and Neurology  Diplomat, American Board of [place] Director, [employer] [facility] [employer]

## 2019-03-30 NOTE — Telephone Encounter (Signed)
I called pt. I advised pt that Dr. Dohmeier reviewed their sleep study results and found that moderate sleep apnea and recommends that pt be treated with a cpap. Dr. Dohmeier recommends that pt return for a repeat sleep study in order to properly titrate the cpap and ensure a good mask fit. Pt is agreeable to returning for a titration study. I advised pt that our sleep lab will file with pt's insurance and call pt to schedule the sleep study when we hear back from the pt's insurance regarding coverage of this sleep study. Pt verbalized understanding of results. Pt had no questions at this time but was encouraged to call back if questions arise.  

## 2019-03-30 NOTE — Addendum Note (Signed)
Addended by: Larey Seat on: 03/30/2019 08:48 AM   Modules accepted: Orders

## 2019-03-30 NOTE — Telephone Encounter (Signed)
-----   Message from Larey Seat, MD sent at 03/30/2019  8:48 AM EST ----- IMPRESSION:   1. Clear evidence of moderate degree of Obstructive Sleep Apnea  (OSA).  2. There is severe hypoxemia, exceeding the expected apnea  related hypoxemia.  3. No Periodic Limb Movement Disorder (PLMD)   RECOMMENDATIONS:   1. Advise full-night, attended, CPAP titration study to optimize  therapy. The patient has been apprehensive about oxygen and CPAP-  Goal is to document that CPAP can improve the oxygenation  sufficiently and if not to titrate oxygen, allowing for  prescription.

## 2019-04-07 DIAGNOSIS — M25511 Pain in right shoulder: Secondary | ICD-10-CM | POA: Diagnosis not present

## 2019-04-07 DIAGNOSIS — Z96611 Presence of right artificial shoulder joint: Secondary | ICD-10-CM | POA: Diagnosis not present

## 2019-04-07 DIAGNOSIS — M62521 Muscle wasting and atrophy, not elsewhere classified, right upper arm: Secondary | ICD-10-CM | POA: Diagnosis not present

## 2019-04-08 DIAGNOSIS — R0902 Hypoxemia: Secondary | ICD-10-CM | POA: Diagnosis not present

## 2019-04-13 DIAGNOSIS — M25511 Pain in right shoulder: Secondary | ICD-10-CM | POA: Diagnosis not present

## 2019-04-13 DIAGNOSIS — Z96611 Presence of right artificial shoulder joint: Secondary | ICD-10-CM | POA: Diagnosis not present

## 2019-04-13 DIAGNOSIS — M62521 Muscle wasting and atrophy, not elsewhere classified, right upper arm: Secondary | ICD-10-CM | POA: Diagnosis not present

## 2019-04-15 DIAGNOSIS — M62521 Muscle wasting and atrophy, not elsewhere classified, right upper arm: Secondary | ICD-10-CM | POA: Diagnosis not present

## 2019-04-15 DIAGNOSIS — M25511 Pain in right shoulder: Secondary | ICD-10-CM | POA: Diagnosis not present

## 2019-04-15 DIAGNOSIS — Z96611 Presence of right artificial shoulder joint: Secondary | ICD-10-CM | POA: Diagnosis not present

## 2019-04-17 DIAGNOSIS — M25511 Pain in right shoulder: Secondary | ICD-10-CM | POA: Diagnosis not present

## 2019-04-17 DIAGNOSIS — M62521 Muscle wasting and atrophy, not elsewhere classified, right upper arm: Secondary | ICD-10-CM | POA: Diagnosis not present

## 2019-04-17 DIAGNOSIS — Z96611 Presence of right artificial shoulder joint: Secondary | ICD-10-CM | POA: Diagnosis not present

## 2019-04-22 DIAGNOSIS — M25511 Pain in right shoulder: Secondary | ICD-10-CM | POA: Diagnosis not present

## 2019-04-22 DIAGNOSIS — Z96611 Presence of right artificial shoulder joint: Secondary | ICD-10-CM | POA: Diagnosis not present

## 2019-04-22 DIAGNOSIS — M62521 Muscle wasting and atrophy, not elsewhere classified, right upper arm: Secondary | ICD-10-CM | POA: Diagnosis not present

## 2019-04-22 DIAGNOSIS — E538 Deficiency of other specified B group vitamins: Secondary | ICD-10-CM | POA: Diagnosis not present

## 2019-04-24 DIAGNOSIS — M62521 Muscle wasting and atrophy, not elsewhere classified, right upper arm: Secondary | ICD-10-CM | POA: Diagnosis not present

## 2019-04-24 DIAGNOSIS — Z96611 Presence of right artificial shoulder joint: Secondary | ICD-10-CM | POA: Diagnosis not present

## 2019-04-24 DIAGNOSIS — M25511 Pain in right shoulder: Secondary | ICD-10-CM | POA: Diagnosis not present

## 2019-04-25 ENCOUNTER — Other Ambulatory Visit (HOSPITAL_COMMUNITY)
Admission: RE | Admit: 2019-04-25 | Discharge: 2019-04-25 | Disposition: A | Discharge status: Home / Self Care | Payer: PPO | Source: Ambulatory Visit | Attending: Neurology | Admitting: Neurology

## 2019-04-25 DIAGNOSIS — Z20828 Contact with and (suspected) exposure to other viral communicable diseases: Secondary | ICD-10-CM | POA: Diagnosis not present

## 2019-04-25 LAB — SARS CORONAVIRUS 2 (TAT 6-24 HRS): SARS Coronavirus 2: NEGATIVE

## 2019-05-02 ENCOUNTER — Ambulatory Visit (INDEPENDENT_AMBULATORY_CARE_PROVIDER_SITE_OTHER): Payer: PPO | Admitting: Neurology

## 2019-05-02 ENCOUNTER — Other Ambulatory Visit: Payer: Self-pay

## 2019-05-02 DIAGNOSIS — I2782 Chronic pulmonary embolism: Secondary | ICD-10-CM

## 2019-05-02 DIAGNOSIS — G4734 Idiopathic sleep related nonobstructive alveolar hypoventilation: Secondary | ICD-10-CM

## 2019-05-02 DIAGNOSIS — R0683 Snoring: Secondary | ICD-10-CM

## 2019-05-02 DIAGNOSIS — G4733 Obstructive sleep apnea (adult) (pediatric): Secondary | ICD-10-CM | POA: Diagnosis not present

## 2019-05-02 DIAGNOSIS — Z8719 Personal history of other diseases of the digestive system: Secondary | ICD-10-CM

## 2019-05-05 DIAGNOSIS — M62521 Muscle wasting and atrophy, not elsewhere classified, right upper arm: Secondary | ICD-10-CM | POA: Diagnosis not present

## 2019-05-05 DIAGNOSIS — Z96611 Presence of right artificial shoulder joint: Secondary | ICD-10-CM | POA: Diagnosis not present

## 2019-05-05 DIAGNOSIS — G4733 Obstructive sleep apnea (adult) (pediatric): Secondary | ICD-10-CM | POA: Insufficient documentation

## 2019-05-05 DIAGNOSIS — M25511 Pain in right shoulder: Secondary | ICD-10-CM | POA: Diagnosis not present

## 2019-05-05 NOTE — Progress Notes (Signed)
DIAGNOSIS  1. The total APNEA/HYPOPNEA INDEX (AHI) was 1.2 /hourObstructive Sleep Apnea controlled under 9 cm water pressure CPAP.  2. Time spent below 89% saturation  equaled 35 minutes. Hypoxemia was controlled - no additional oxygen was used.    PLANS/RECOMMENDATIONS:  Autotitration capable CPAP device with heated humidity, being set  for a range of pressure from 5-12 cm water, 2 cm EPR. The  patient was fitted with a Small Simplus FFM mask.  No oxygen needed.

## 2019-05-05 NOTE — Procedures (Signed)
PATIENT'S NAME:  Carrie Keller, Carrie Keller DOB:      1945-11-20      MR#:    [id number]     DATE OF RECORDING: 05/02/2019 REFERRING M.D.:  Quintella Reichert, MD Study Performed:   Titration to positive airway pressure HISTORY:  Carrie Keller is a 73- year- old Caucasian female patient seen on 03/03/2019 in consultation. Her Diagnostic polysomnogram performed on 03/15/2019 revealed: Clear evidence of moderate degree of Obstructive Sleep Apnea (OSA). AHI was 23.2 and REM AHI was 57/h.  There is severe hypoxemia, exceeding the expected apnea related hypoxemia ( 321 minutes) .  Here for CPAP titration, and to see if oxygen is needed once CPAP is fully titrated.  Chief concern according to patient: " I am on night-time oxygen and hate that machine but I sleep ok with it".  Supplemental Oxygen at 2 L was ordered by NP Margarita Grizzle about a month ago."  The patient endorsed the Epworth Sleepiness Scale at 16 points.   The patient's weight 168 pounds with a height of 62 (inches), resulting in a BMI of 30.8 kg/m2. The patient's neck circumference measured 15.25 inches.  CURRENT MEDICATIONS: Xanax, Celebrex, Vitamin D2, Ferrous sulfate, Neurontin, Norco, Antivert, Multivitamin, Prilosec, Zofran, Ditropan XL, Protonix, Miralax, Mirapex, Pravachol, Desyrel, Benefiber.    PROCEDURE:  This is a multichannel digital polysomnogram utilizing the SomnoStar 11.2 system.  Electrodes and sensors were applied and monitored per AASM Specifications.   EEG, EOG, Chin and Limb EMG, were sampled at 200 Hz.  ECG, Snore and Nasal Pressure, Thermal Airflow, Respiratory Effort, CPAP Flow and Pressure, Oximetry was sampled at 50 Hz. Digital video and audio were recorded.     CPAP was initiated at 5 cmH20 with heated humidity per AASM split night standards and pressure was advanced to 9cmH20 because of hypopneas, apneas and desaturations.  At a PAP pressure of 9 cmH20, there was a reduction of the AHI to 0.0 with improvement of sleep apnea , but the  oxygen nadir was still 88%.  A Simplus FFM in small was used.  Lights Out was at 22:00 and Lights On at 04:59. Total recording time (TRT) was 419 minutes, with a total sleep time (TST) of 389.5 minutes. The patient's sleep latency was 6.5 minutes. REM latency was 84.5 minutes.  The sleep efficiency was 93.1 %.    SLEEP ARCHITECTURE: WASO (Wake after sleep onset) was 23 minutes.  There were 6 minutes in Stage N1, 274 minutes Stage N2, 56 minutes Stage N3 and 53.5 minutes in Stage REM.  The percentage of Stage N1 was 1.5%, Stage N2 was 70.3%, Stage N3 was 14.4% and Stage R (REM sleep) was 13.7%. The sleep architecture was notable for sustained sleep.  RESPIRATORY ANALYSIS:  There was a total of 8 respiratory events 0 apneas and 8 hypopneas.     The total APNEA/HYPOPNEA INDEX (AHI) was 1.2 /hour.  4 events occurred in REM sleep and 4 events in NREM. The REM AHI was 4.5 /hour versus a non-REM AHI of 0.7 /hour.  The patient spent 389.5 minutes of total sleep time in the supine position and 0 minutes in non-supine. The supine AHI was 1.2, versus a non-supine AHI of 0.0.  OXYGEN SATURATION & C02:  The baseline 02 saturation was 96%, with the lowest being 81%. Time spent below 89% saturation equaled 35 minutes. The arousals were noted as: 26 were spontaneous, 2 were associated with PLMs, 0 were associated with respiratory events. The patient had a total of  5 Periodic Limb Movements. The Periodic Limb Movement (PLM) index was .8 and the PLM Arousal index was .3 /hour.  Audio and video analysis did not show any abnormal or unusual movements, behaviors, phonations or vocalizations.  Snoring was controlled. EKG was in keeping with normal sinus rhythm.   DIAGNOSIS 1. Obstructive Sleep Apnea controlled under 9 cm water pressure CPAP.  2. Hypoxemia was controlled - no additional oxygen was used.    PLANS/RECOMMENDATIONS: Autotitration capable CPAP device with heated humidity, being set for a range of  pressure from 5-112 cm water, 2 cm EPR. The patient was fitted with a Small Simplus FFM mask. No oxygen needed.   A follow up appointment will be scheduled with our NP in the Sleep Clinic at [facility].   Please call [phone] with any questions.     I certify that I have reviewed the entire raw data recording prior to the issuance of this report in accordance with the Standards of Accreditation of the American Academy of Sleep Medicine (AASM)    Larey Seat, M.D. Diplomat, Tax adviser of Psychiatry and Neurology  Diplomat, Tax adviser of Sleep Medicine Market researcher, [employer] [facility] at [employer]

## 2019-05-05 NOTE — Addendum Note (Signed)
Addended by: Larey Seat on: 05/05/2019 04:11 PM   Modules accepted: Orders

## 2019-05-06 ENCOUNTER — Telehealth: Payer: Self-pay | Admitting: Neurology

## 2019-05-06 NOTE — Telephone Encounter (Signed)
I called pt. I advised pt that Dr. Brett Fairy reviewed their sleep study results and found that pt best tolerated CPAP at pressure of 9 cm. Dr. Brett Fairy recommends that pt starts auto CPAP 5-12 cm water pressure. I reviewed PAP compliance expectations with the pt. Pt is agreeable to starting a CPAP. I advised pt that an order will be sent to a DME, [facility], and [facility] will call the pt within about one week after they file with the pt's insurance. [facility] will show the pt how to use the machine, fit for masks, and troubleshoot the CPAP if needed. A follow up appt was made for insurance purposes with Dr. Brett Fairy on March 3,2021 at 10:30 am. Pt verbalized understanding to arrive 15 minutes early and bring their CPAP. A letter with all of this information in it will be mailed to the pt as a reminder. I verified with the pt that the address we have on file is correct. Pt verbalized understanding of results. Pt had no questions at this time but was encouraged to call back if questions arise. I have sent the order to [facility] and have received confirmation that they have received the order.

## 2019-05-06 NOTE — Telephone Encounter (Signed)
-----   Message from Larey Seat, MD sent at 05/05/2019  4:11 PM EST ----- DIAGNOSIS  1. The total APNEA/HYPOPNEA INDEX (AHI) was 1.2 /hourObstructive Sleep Apnea controlled under 9 cm water pressure CPAP.  2. Time spent below 89% saturation  equaled 35 minutes. Hypoxemia was controlled - no additional oxygen was used.    PLANS/RECOMMENDATIONS:  Autotitration capable CPAP device with heated humidity, being set  for a range of pressure from 5-12 cm water, 2 cm EPR. The  patient was fitted with a Small Simplus FFM mask.  No oxygen needed.

## 2019-05-08 DIAGNOSIS — R0902 Hypoxemia: Secondary | ICD-10-CM | POA: Diagnosis not present

## 2019-05-12 DIAGNOSIS — M25511 Pain in right shoulder: Secondary | ICD-10-CM | POA: Diagnosis not present

## 2019-05-12 DIAGNOSIS — M62521 Muscle wasting and atrophy, not elsewhere classified, right upper arm: Secondary | ICD-10-CM | POA: Diagnosis not present

## 2019-05-12 DIAGNOSIS — Z96611 Presence of right artificial shoulder joint: Secondary | ICD-10-CM | POA: Diagnosis not present

## 2019-05-14 DIAGNOSIS — Z96611 Presence of right artificial shoulder joint: Secondary | ICD-10-CM | POA: Diagnosis not present

## 2019-05-14 DIAGNOSIS — M25511 Pain in right shoulder: Secondary | ICD-10-CM | POA: Diagnosis not present

## 2019-05-14 DIAGNOSIS — M62521 Muscle wasting and atrophy, not elsewhere classified, right upper arm: Secondary | ICD-10-CM | POA: Diagnosis not present

## 2019-06-02 ENCOUNTER — Telehealth: Payer: Self-pay | Admitting: Neurology

## 2019-06-02 NOTE — Telephone Encounter (Signed)
Pt has r/s

## 2019-06-02 NOTE — Telephone Encounter (Signed)
Called the patient to advise that we had her apt is scheduled for 1/20. I saw where she just started the machine on 05/22/19. Pt needs to push apt out 06/23/19-08/20/19 to be within the insurance compliance guidelines. Pt didn't answer. LVM infoming the patient we can cancel the 1/20 apt and reschedule within the above dates.

## 2019-06-03 ENCOUNTER — Ambulatory Visit: Payer: Self-pay | Admitting: Neurology

## 2019-07-15 ENCOUNTER — Ambulatory Visit: Payer: Self-pay | Admitting: Neurology

## 2019-07-16 DIAGNOSIS — Z6830 Body mass index (BMI) 30.0-30.9, adult: Secondary | ICD-10-CM | POA: Diagnosis not present

## 2019-07-16 DIAGNOSIS — M545 Low back pain: Secondary | ICD-10-CM | POA: Diagnosis not present

## 2019-07-16 DIAGNOSIS — R030 Elevated blood-pressure reading, without diagnosis of hypertension: Secondary | ICD-10-CM | POA: Diagnosis not present

## 2019-07-20 DIAGNOSIS — G4733 Obstructive sleep apnea (adult) (pediatric): Secondary | ICD-10-CM | POA: Diagnosis not present

## 2019-08-10 DIAGNOSIS — D509 Iron deficiency anemia, unspecified: Secondary | ICD-10-CM | POA: Diagnosis not present

## 2019-08-11 ENCOUNTER — Ambulatory Visit: Payer: Self-pay | Admitting: Neurology

## 2019-08-18 ENCOUNTER — Ambulatory Visit: Payer: Self-pay | Admitting: Neurology

## 2019-08-20 DIAGNOSIS — G4733 Obstructive sleep apnea (adult) (pediatric): Secondary | ICD-10-CM | POA: Diagnosis not present

## 2019-08-27 DIAGNOSIS — E538 Deficiency of other specified B group vitamins: Secondary | ICD-10-CM | POA: Diagnosis not present

## 2019-08-27 DIAGNOSIS — D509 Iron deficiency anemia, unspecified: Secondary | ICD-10-CM | POA: Diagnosis not present

## 2019-08-27 DIAGNOSIS — M419 Scoliosis, unspecified: Secondary | ICD-10-CM | POA: Diagnosis not present

## 2019-08-27 DIAGNOSIS — Z6832 Body mass index (BMI) 32.0-32.9, adult: Secondary | ICD-10-CM | POA: Diagnosis not present

## 2019-08-27 DIAGNOSIS — E785 Hyperlipidemia, unspecified: Secondary | ICD-10-CM | POA: Diagnosis not present

## 2019-08-27 DIAGNOSIS — R739 Hyperglycemia, unspecified: Secondary | ICD-10-CM | POA: Diagnosis not present

## 2019-08-27 DIAGNOSIS — F419 Anxiety disorder, unspecified: Secondary | ICD-10-CM | POA: Diagnosis not present

## 2019-08-27 DIAGNOSIS — F329 Major depressive disorder, single episode, unspecified: Secondary | ICD-10-CM | POA: Diagnosis not present

## 2019-08-28 DIAGNOSIS — M19111 Post-traumatic osteoarthritis, right shoulder: Secondary | ICD-10-CM | POA: Diagnosis not present

## 2019-09-07 DIAGNOSIS — M48061 Spinal stenosis, lumbar region without neurogenic claudication: Secondary | ICD-10-CM | POA: Diagnosis not present

## 2019-09-07 DIAGNOSIS — M419 Scoliosis, unspecified: Secondary | ICD-10-CM | POA: Diagnosis not present

## 2019-09-07 DIAGNOSIS — M961 Postlaminectomy syndrome, not elsewhere classified: Secondary | ICD-10-CM | POA: Diagnosis not present

## 2019-09-11 DIAGNOSIS — G4733 Obstructive sleep apnea (adult) (pediatric): Secondary | ICD-10-CM | POA: Diagnosis not present

## 2019-09-16 ENCOUNTER — Ambulatory Visit: Payer: Self-pay | Admitting: Neurology

## 2019-09-16 DIAGNOSIS — F419 Anxiety disorder, unspecified: Secondary | ICD-10-CM | POA: Diagnosis not present

## 2019-09-16 DIAGNOSIS — Z6832 Body mass index (BMI) 32.0-32.9, adult: Secondary | ICD-10-CM | POA: Diagnosis not present

## 2019-09-16 DIAGNOSIS — M25561 Pain in right knee: Secondary | ICD-10-CM | POA: Diagnosis not present

## 2019-09-16 DIAGNOSIS — F329 Major depressive disorder, single episode, unspecified: Secondary | ICD-10-CM | POA: Diagnosis not present

## 2019-09-17 DIAGNOSIS — M50321 Other cervical disc degeneration at C4-C5 level: Secondary | ICD-10-CM | POA: Diagnosis not present

## 2019-09-17 DIAGNOSIS — M47812 Spondylosis without myelopathy or radiculopathy, cervical region: Secondary | ICD-10-CM | POA: Diagnosis not present

## 2019-09-19 DIAGNOSIS — G4733 Obstructive sleep apnea (adult) (pediatric): Secondary | ICD-10-CM | POA: Diagnosis not present

## 2019-09-30 ENCOUNTER — Ambulatory Visit: Payer: Self-pay | Admitting: Neurology

## 2019-10-20 DIAGNOSIS — G4733 Obstructive sleep apnea (adult) (pediatric): Secondary | ICD-10-CM | POA: Diagnosis not present

## 2019-10-22 ENCOUNTER — Ambulatory Visit: Payer: Self-pay | Admitting: Neurology

## 2019-10-22 ENCOUNTER — Telehealth: Payer: Self-pay | Admitting: Neurology

## 2019-10-22 NOTE — Telephone Encounter (Signed)
Pt states she is returning a call to [facility].  Pt has asked that Jinny Blossom be informed she is not using the CPAP and she'd like to know what her other options are

## 2019-10-22 NOTE — Telephone Encounter (Signed)
I contacted the pt back and advised via vm that she keep her 130 appt for today and we could discuss further.

## 2019-10-28 DIAGNOSIS — D692 Other nonthrombocytopenic purpura: Secondary | ICD-10-CM | POA: Diagnosis not present

## 2019-10-28 DIAGNOSIS — R21 Rash and other nonspecific skin eruption: Secondary | ICD-10-CM | POA: Diagnosis not present

## 2019-10-28 DIAGNOSIS — Z6830 Body mass index (BMI) 30.0-30.9, adult: Secondary | ICD-10-CM | POA: Diagnosis not present

## 2019-10-28 DIAGNOSIS — T148XXA Other injury of unspecified body region, initial encounter: Secondary | ICD-10-CM | POA: Diagnosis not present

## 2019-10-30 DIAGNOSIS — M961 Postlaminectomy syndrome, not elsewhere classified: Secondary | ICD-10-CM | POA: Diagnosis not present

## 2019-10-30 DIAGNOSIS — M419 Scoliosis, unspecified: Secondary | ICD-10-CM | POA: Diagnosis not present

## 2019-10-30 DIAGNOSIS — M47812 Spondylosis without myelopathy or radiculopathy, cervical region: Secondary | ICD-10-CM | POA: Diagnosis not present

## 2019-11-17 ENCOUNTER — Telehealth: Payer: Self-pay | Admitting: Neurology

## 2019-11-17 ENCOUNTER — Ambulatory Visit: Payer: Self-pay | Admitting: Neurology

## 2019-11-17 NOTE — Telephone Encounter (Signed)
Pt states she is unable to make apt at 1:30 today due to vertigo spell she has currently.

## 2019-11-19 DIAGNOSIS — G8929 Other chronic pain: Secondary | ICD-10-CM | POA: Diagnosis not present

## 2019-11-19 DIAGNOSIS — F329 Major depressive disorder, single episode, unspecified: Secondary | ICD-10-CM | POA: Diagnosis not present

## 2019-11-19 DIAGNOSIS — R42 Dizziness and giddiness: Secondary | ICD-10-CM | POA: Diagnosis not present

## 2019-11-19 DIAGNOSIS — F419 Anxiety disorder, unspecified: Secondary | ICD-10-CM | POA: Diagnosis not present

## 2019-11-19 DIAGNOSIS — G4733 Obstructive sleep apnea (adult) (pediatric): Secondary | ICD-10-CM | POA: Diagnosis not present

## 2019-11-19 DIAGNOSIS — M79659 Pain in unspecified thigh: Secondary | ICD-10-CM | POA: Diagnosis not present

## 2019-12-15 ENCOUNTER — Encounter: Payer: Self-pay | Admitting: Neurology

## 2019-12-15 ENCOUNTER — Ambulatory Visit: Payer: Self-pay | Admitting: Neurology

## 2019-12-15 ENCOUNTER — Telehealth: Payer: Self-pay

## 2019-12-15 NOTE — Telephone Encounter (Signed)
Dismissal , please send to [facility].

## 2019-12-15 NOTE — Telephone Encounter (Signed)
Pt no showed 12/15/2019 appt with Dr. Brett Fairy.  This is her 3rd no show since 10/22/2019.

## 2019-12-17 ENCOUNTER — Encounter: Payer: Self-pay | Admitting: Neurology

## 2019-12-18 DIAGNOSIS — E538 Deficiency of other specified B group vitamins: Secondary | ICD-10-CM | POA: Diagnosis not present

## 2019-12-18 DIAGNOSIS — G629 Polyneuropathy, unspecified: Secondary | ICD-10-CM | POA: Diagnosis not present

## 2019-12-18 DIAGNOSIS — E785 Hyperlipidemia, unspecified: Secondary | ICD-10-CM | POA: Diagnosis not present

## 2019-12-18 DIAGNOSIS — R69 Illness, unspecified: Secondary | ICD-10-CM | POA: Diagnosis not present

## 2019-12-18 DIAGNOSIS — Z6830 Body mass index (BMI) 30.0-30.9, adult: Secondary | ICD-10-CM | POA: Diagnosis not present

## 2019-12-18 DIAGNOSIS — F329 Major depressive disorder, single episode, unspecified: Secondary | ICD-10-CM | POA: Diagnosis not present

## 2019-12-18 DIAGNOSIS — R739 Hyperglycemia, unspecified: Secondary | ICD-10-CM | POA: Diagnosis not present

## 2019-12-18 DIAGNOSIS — G2581 Restless legs syndrome: Secondary | ICD-10-CM | POA: Diagnosis not present

## 2019-12-18 DIAGNOSIS — D509 Iron deficiency anemia, unspecified: Secondary | ICD-10-CM | POA: Diagnosis not present

## 2020-01-08 DIAGNOSIS — M19111 Post-traumatic osteoarthritis, right shoulder: Secondary | ICD-10-CM | POA: Diagnosis not present

## 2020-01-12 DIAGNOSIS — M48061 Spinal stenosis, lumbar region without neurogenic claudication: Secondary | ICD-10-CM | POA: Diagnosis not present

## 2020-01-12 DIAGNOSIS — M961 Postlaminectomy syndrome, not elsewhere classified: Secondary | ICD-10-CM | POA: Diagnosis not present

## 2020-01-12 DIAGNOSIS — M419 Scoliosis, unspecified: Secondary | ICD-10-CM | POA: Diagnosis not present

## 2020-01-19 DIAGNOSIS — E538 Deficiency of other specified B group vitamins: Secondary | ICD-10-CM | POA: Diagnosis not present

## 2020-01-23 DIAGNOSIS — Z79899 Other long term (current) drug therapy: Secondary | ICD-10-CM | POA: Diagnosis not present

## 2020-01-23 DIAGNOSIS — R69 Illness, unspecified: Secondary | ICD-10-CM | POA: Diagnosis not present

## 2020-01-25 DIAGNOSIS — F4323 Adjustment disorder with mixed anxiety and depressed mood: Secondary | ICD-10-CM | POA: Diagnosis not present

## 2020-01-25 DIAGNOSIS — E785 Hyperlipidemia, unspecified: Secondary | ICD-10-CM | POA: Diagnosis not present

## 2020-01-25 DIAGNOSIS — G2581 Restless legs syndrome: Secondary | ICD-10-CM | POA: Diagnosis not present

## 2020-01-25 DIAGNOSIS — R69 Illness, unspecified: Secondary | ICD-10-CM | POA: Diagnosis not present

## 2020-01-25 DIAGNOSIS — F410 Panic disorder [episodic paroxysmal anxiety] without agoraphobia: Secondary | ICD-10-CM | POA: Diagnosis not present

## 2020-01-25 DIAGNOSIS — G629 Polyneuropathy, unspecified: Secondary | ICD-10-CM | POA: Diagnosis not present

## 2020-01-25 DIAGNOSIS — E669 Obesity, unspecified: Secondary | ICD-10-CM | POA: Diagnosis not present

## 2020-01-25 DIAGNOSIS — G8929 Other chronic pain: Secondary | ICD-10-CM | POA: Diagnosis not present

## 2020-01-25 DIAGNOSIS — G4700 Insomnia, unspecified: Secondary | ICD-10-CM | POA: Diagnosis not present

## 2020-01-25 DIAGNOSIS — F329 Major depressive disorder, single episode, unspecified: Secondary | ICD-10-CM | POA: Diagnosis not present

## 2020-01-28 DIAGNOSIS — M5136 Other intervertebral disc degeneration, lumbar region: Secondary | ICD-10-CM | POA: Diagnosis not present

## 2020-01-28 DIAGNOSIS — M48061 Spinal stenosis, lumbar region without neurogenic claudication: Secondary | ICD-10-CM | POA: Diagnosis not present

## 2020-02-24 DIAGNOSIS — M961 Postlaminectomy syndrome, not elsewhere classified: Secondary | ICD-10-CM | POA: Diagnosis not present

## 2020-02-24 DIAGNOSIS — M419 Scoliosis, unspecified: Secondary | ICD-10-CM | POA: Diagnosis not present

## 2020-02-24 DIAGNOSIS — M48061 Spinal stenosis, lumbar region without neurogenic claudication: Secondary | ICD-10-CM | POA: Diagnosis not present

## 2020-02-25 DIAGNOSIS — F32A Depression, unspecified: Secondary | ICD-10-CM | POA: Diagnosis not present

## 2020-02-25 DIAGNOSIS — Z23 Encounter for immunization: Secondary | ICD-10-CM | POA: Diagnosis not present

## 2020-02-25 DIAGNOSIS — F419 Anxiety disorder, unspecified: Secondary | ICD-10-CM | POA: Diagnosis not present

## 2020-02-25 DIAGNOSIS — R69 Illness, unspecified: Secondary | ICD-10-CM | POA: Diagnosis not present

## 2020-02-25 DIAGNOSIS — D519 Vitamin B12 deficiency anemia, unspecified: Secondary | ICD-10-CM | POA: Diagnosis not present

## 2020-03-21 DIAGNOSIS — M961 Postlaminectomy syndrome, not elsewhere classified: Secondary | ICD-10-CM | POA: Diagnosis not present

## 2020-03-21 DIAGNOSIS — M4186 Other forms of scoliosis, lumbar region: Secondary | ICD-10-CM | POA: Diagnosis not present

## 2020-03-21 DIAGNOSIS — M545 Low back pain, unspecified: Secondary | ICD-10-CM | POA: Diagnosis not present

## 2020-03-21 DIAGNOSIS — Z981 Arthrodesis status: Secondary | ICD-10-CM | POA: Diagnosis not present

## 2020-03-21 DIAGNOSIS — M48061 Spinal stenosis, lumbar region without neurogenic claudication: Secondary | ICD-10-CM | POA: Diagnosis not present

## 2020-03-21 DIAGNOSIS — M47817 Spondylosis without myelopathy or radiculopathy, lumbosacral region: Secondary | ICD-10-CM | POA: Diagnosis not present

## 2020-03-21 DIAGNOSIS — M5126 Other intervertebral disc displacement, lumbar region: Secondary | ICD-10-CM | POA: Diagnosis not present

## 2020-03-24 DIAGNOSIS — G629 Polyneuropathy, unspecified: Secondary | ICD-10-CM | POA: Diagnosis not present

## 2020-03-24 DIAGNOSIS — E559 Vitamin D deficiency, unspecified: Secondary | ICD-10-CM | POA: Diagnosis not present

## 2020-03-24 DIAGNOSIS — F331 Major depressive disorder, recurrent, moderate: Secondary | ICD-10-CM | POA: Diagnosis not present

## 2020-03-24 DIAGNOSIS — J449 Chronic obstructive pulmonary disease, unspecified: Secondary | ICD-10-CM | POA: Diagnosis not present

## 2020-03-24 DIAGNOSIS — D509 Iron deficiency anemia, unspecified: Secondary | ICD-10-CM | POA: Diagnosis not present

## 2020-03-24 DIAGNOSIS — E785 Hyperlipidemia, unspecified: Secondary | ICD-10-CM | POA: Diagnosis not present

## 2020-03-24 DIAGNOSIS — R69 Illness, unspecified: Secondary | ICD-10-CM | POA: Diagnosis not present

## 2020-03-24 DIAGNOSIS — R739 Hyperglycemia, unspecified: Secondary | ICD-10-CM | POA: Diagnosis not present

## 2020-03-24 DIAGNOSIS — G2581 Restless legs syndrome: Secondary | ICD-10-CM | POA: Diagnosis not present

## 2020-03-24 DIAGNOSIS — E538 Deficiency of other specified B group vitamins: Secondary | ICD-10-CM | POA: Diagnosis not present

## 2020-03-30 DIAGNOSIS — M47812 Spondylosis without myelopathy or radiculopathy, cervical region: Secondary | ICD-10-CM | POA: Diagnosis not present

## 2020-03-30 DIAGNOSIS — M419 Scoliosis, unspecified: Secondary | ICD-10-CM | POA: Diagnosis not present

## 2020-03-30 DIAGNOSIS — M48061 Spinal stenosis, lumbar region without neurogenic claudication: Secondary | ICD-10-CM | POA: Diagnosis not present

## 2020-03-30 DIAGNOSIS — M961 Postlaminectomy syndrome, not elsewhere classified: Secondary | ICD-10-CM | POA: Diagnosis not present

## 2020-04-06 ENCOUNTER — Other Ambulatory Visit: Payer: Self-pay | Admitting: Psychiatric/Mental Health

## 2020-04-06 ENCOUNTER — Emergency Department (HOSPITAL_COMMUNITY): Payer: Medicare HMO

## 2020-04-06 ENCOUNTER — Emergency Department (HOSPITAL_COMMUNITY)
Admission: EM | Admit: 2020-04-06 | Discharge: 2020-04-06 | Disposition: A | Discharge status: Home / Self Care | Payer: Medicare HMO | Attending: Emergency Medicine | Admitting: Emergency Medicine

## 2020-04-06 ENCOUNTER — Encounter (HOSPITAL_COMMUNITY): Payer: Self-pay | Admitting: Behavioral Health

## 2020-04-06 DIAGNOSIS — Z96653 Presence of artificial knee joint, bilateral: Secondary | ICD-10-CM | POA: Insufficient documentation

## 2020-04-06 DIAGNOSIS — Z96611 Presence of right artificial shoulder joint: Secondary | ICD-10-CM | POA: Diagnosis not present

## 2020-04-06 DIAGNOSIS — Z20822 Contact with and (suspected) exposure to covid-19: Secondary | ICD-10-CM | POA: Diagnosis not present

## 2020-04-06 DIAGNOSIS — R45851 Suicidal ideations: Secondary | ICD-10-CM | POA: Diagnosis not present

## 2020-04-06 DIAGNOSIS — F410 Panic disorder [episodic paroxysmal anxiety] without agoraphobia: Secondary | ICD-10-CM | POA: Diagnosis not present

## 2020-04-06 DIAGNOSIS — R69 Illness, unspecified: Secondary | ICD-10-CM | POA: Diagnosis not present

## 2020-04-06 DIAGNOSIS — R9431 Abnormal electrocardiogram [ECG] [EKG]: Secondary | ICD-10-CM | POA: Diagnosis not present

## 2020-04-06 DIAGNOSIS — Z87891 Personal history of nicotine dependence: Secondary | ICD-10-CM | POA: Insufficient documentation

## 2020-04-06 DIAGNOSIS — F39 Unspecified mood [affective] disorder: Secondary | ICD-10-CM | POA: Diagnosis not present

## 2020-04-06 DIAGNOSIS — R0902 Hypoxemia: Secondary | ICD-10-CM | POA: Diagnosis not present

## 2020-04-06 DIAGNOSIS — R001 Bradycardia, unspecified: Secondary | ICD-10-CM | POA: Diagnosis not present

## 2020-04-06 DIAGNOSIS — Z79899 Other long term (current) drug therapy: Secondary | ICD-10-CM | POA: Diagnosis not present

## 2020-04-06 DIAGNOSIS — F332 Major depressive disorder, recurrent severe without psychotic features: Secondary | ICD-10-CM | POA: Insufficient documentation

## 2020-04-06 DIAGNOSIS — G2581 Restless legs syndrome: Secondary | ICD-10-CM | POA: Diagnosis not present

## 2020-04-06 DIAGNOSIS — J9811 Atelectasis: Secondary | ICD-10-CM | POA: Diagnosis not present

## 2020-04-06 LAB — CBC WITH DIFFERENTIAL/PLATELET
Abs Immature Granulocytes: 0.02 10*3/uL (ref 0.00–0.07)
Basophils Absolute: 0 10*3/uL (ref 0.0–0.1)
Basophils Relative: 1 %
Eosinophils Absolute: 0.1 10*3/uL (ref 0.0–0.5)
Eosinophils Relative: 2 %
HCT: 43.2 % (ref 36.0–46.0)
Hemoglobin: 13.6 g/dL (ref 12.0–15.0)
Immature Granulocytes: 0 %
Lymphocytes Relative: 32 %
Lymphs Abs: 2 10*3/uL (ref 0.7–4.0)
MCH: 30 pg (ref 26.0–34.0)
MCHC: 31.5 g/dL (ref 30.0–36.0)
MCV: 95.2 fL (ref 80.0–100.0)
Monocytes Absolute: 0.6 10*3/uL (ref 0.1–1.0)
Monocytes Relative: 11 %
Neutro Abs: 3.3 10*3/uL (ref 1.7–7.7)
Neutrophils Relative %: 54 %
Platelets: 288 10*3/uL (ref 150–400)
RBC: 4.54 MIL/uL (ref 3.87–5.11)
RDW: 13.9 % (ref 11.5–15.5)
WBC: 6.1 10*3/uL (ref 4.0–10.5)
nRBC: 0 % (ref 0.0–0.2)

## 2020-04-06 LAB — COMPREHENSIVE METABOLIC PANEL
ALT: 19 U/L (ref 0–44)
AST: 17 U/L (ref 15–41)
Albumin: 4.1 g/dL (ref 3.5–5.0)
Alkaline Phosphatase: 89 U/L (ref 38–126)
Anion gap: 8 (ref 5–15)
BUN: 16 mg/dL (ref 8–23)
CO2: 27 mmol/L (ref 22–32)
Calcium: 9.5 mg/dL (ref 8.9–10.3)
Chloride: 105 mmol/L (ref 98–111)
Creatinine, Ser: 0.6 mg/dL (ref 0.44–1.00)
GFR, Estimated: >60 mL/min (ref >60)
Glucose, Bld: 86 mg/dL (ref 70–99)
Potassium: 4.3 mmol/L (ref 3.5–5.1)
Sodium: 140 mmol/L (ref 135–145)
Total Bilirubin: 0.5 mg/dL (ref 0.3–1.2)
Total Protein: 7.5 g/dL (ref 6.5–8.1)

## 2020-04-06 LAB — SALICYLATE LEVEL: Salicylate Lvl: <7 mg/dL — ABNORMAL LOW (ref 7.0–30.0)

## 2020-04-06 LAB — RESP PANEL BY RT-PCR (FLU A&B, COVID) ARPGX2
Influenza A by PCR: NEGATIVE
Influenza B by PCR: NEGATIVE
SARS Coronavirus 2 by RT PCR: NEGATIVE

## 2020-04-06 LAB — RAPID URINE DRUG SCREEN, HOSP PERFORMED
Amphetamines: NOT DETECTED
Barbiturates: NOT DETECTED
Benzodiazepines: POSITIVE — AB
Cocaine: NOT DETECTED
Opiates: POSITIVE — AB
Tetrahydrocannabinol: NOT DETECTED

## 2020-04-06 LAB — ACETAMINOPHEN LEVEL: Acetaminophen (Tylenol), Serum: 12 ug/mL (ref 10–30)

## 2020-04-06 LAB — ETHANOL: Alcohol, Ethyl (B): <10 mg/dL (ref <10)

## 2020-04-06 MED ORDER — ONDANSETRON HCL 4 MG PO TABS: 8.0000 mg | ORAL_TABLET | Freq: Three times a day (TID) | ORAL | Status: DC | PRN

## 2020-04-06 MED ORDER — OXYCODONE HCL 5 MG PO TABS: 5.0000 mg | ORAL_TABLET | Freq: Two times a day (BID) | ORAL | Status: DC | PRN

## 2020-04-06 MED ORDER — GABAPENTIN 300 MG PO CAPS: 600.0000 mg | ORAL_CAPSULE | Freq: Every day | ORAL | Status: DC

## 2020-04-06 MED ORDER — PRAVASTATIN SODIUM 20 MG PO TABS
80.0000 mg | ORAL_TABLET | Freq: Every day | ORAL | Status: DC
  Administered 2020-04-06: 80 mg via ORAL
  Filled 2020-04-06: qty 4

## 2020-04-06 MED ORDER — PANTOPRAZOLE SODIUM 40 MG PO TBEC
40.0000 mg | DELAYED_RELEASE_TABLET | Freq: Every day | ORAL | Status: DC
  Administered 2020-04-06: 40 mg via ORAL
  Filled 2020-04-06: qty 1

## 2020-04-06 MED ORDER — FERROUS SULFATE 325 (65 FE) MG PO TABS: 325.0000 mg | ORAL_TABLET | Freq: Three times a day (TID) | ORAL | Status: DC

## 2020-04-06 MED ORDER — TRAZODONE HCL 100 MG PO TABS: 100.0000 mg | ORAL_TABLET | Freq: Every day | ORAL | Status: DC

## 2020-04-06 MED ORDER — OXYBUTYNIN CHLORIDE ER 10 MG PO TB24
10.0000 mg | ORAL_TABLET | Freq: Every day | ORAL | Status: DC
  Filled 2020-04-06: qty 1

## 2020-04-06 MED ORDER — PRAMIPEXOLE DIHYDROCHLORIDE 1 MG PO TABS
2.0000 mg | ORAL_TABLET | Freq: Every day | ORAL | Status: DC
  Filled 2020-04-06: qty 2

## 2020-04-06 MED ORDER — EZETIMIBE 10 MG PO TABS
10.0000 mg | ORAL_TABLET | Freq: Every day | ORAL | Status: DC
  Filled 2020-04-06: qty 1

## 2020-04-06 MED ORDER — ALPRAZOLAM 0.5 MG PO TABS: 0.5000 mg | ORAL_TABLET | Freq: Every evening | ORAL | Status: DC | PRN

## 2020-04-06 MED ORDER — POLYETHYLENE GLYCOL 3350 17 G PO PACK: 17.0000 g | PACK | Freq: Every day | ORAL | Status: DC | PRN

## 2020-04-06 NOTE — ED Notes (Signed)
Dinner tray provided

## 2020-04-06 NOTE — ED Notes (Signed)
Tele psych computer at bedside for consult.

## 2020-04-06 NOTE — BH Assessment (Signed)
[place] Assessment Progress Note  Per Harriett Sine, NP, this voluntary pt requires psychiatric hospitalization at this time.  At 16:04 Shirlean Mylar calls from [facility].  Pt has been accepted to their facility by Dr Jannette Spanner to the [facility], Rm 152-2.  Marcie Bal and EDP Dorie Rank, MD, concur with this disposition, as does the pt who is currently under voluntary status.  Pt has signed the [facility] consent for admission and treatment form, which I have sent to [facility]. Pt's nurse, Arrie Aran, has been notified, and she agrees to call report to [phone].  Pt is to be transported via [facility].  [facility] stipulates that pt must arrive either before 23:00 tonight, or after 06:00 tomorrow.    Jalene Mullet, [place] Coordinator [phone]

## 2020-04-06 NOTE — ED Notes (Signed)
Report given to [facility] in [place].

## 2020-04-06 NOTE — ED Notes (Signed)
Pt crying and very tearful " what is wrong with me". Reassurance provided.

## 2020-04-06 NOTE — ED Notes (Signed)
[facility] notified of transport.

## 2020-04-06 NOTE — ED Notes (Signed)
Report given to [facility] at [facility].

## 2020-04-06 NOTE — ED Provider Notes (Signed)
[facility] DEPT Provider Note   CSN: [id number] Arrival date & time: 04/06/20  1014     History Chief Complaint  Patient presents with  . Ingestion  . Suicidal    Carrie Keller is a 74 y.o. female.  HPI   Patient presents to the ED for depression and suicidal ideation.  Patient states she has been feeling depressed and lonely recently.  Last night the symptoms were more severe.  She was really anxious and afraid.  Not sure what exactly was going on.  She did speak to her daughter last night.  Patient states she told her about how she was feeling.  Patient states she is depressed and lonely and does not want to be alone.  She is not going to see her family this Thanksgiving holiday.  She is upset about that.  When I asked her why she states she was not invited.  Patient did take a Xanax and oxycodone this morning.  She is prescribed those medications and took them for her pain and anxiety.  Patient denies any overdose.  Patient has had thoughts about not wanting to feel this way anymore and not wanting to continue to live like this but has not thought about specifically harming herself. patient did not call EMS.  Reportedly the patient's daughter spoke with the primary care doctor this morning and they contacted EMS.  Past Medical History:  Diagnosis Date  . Anxiety    sees Dr Jerl Santos  psychiatrist every 3 month  . Arthritis   . Complication of anesthesia   . Depression   . DVT (deep venous thrombosis) ([place]) 2008   RIGHT /POST OP  . GERD (gastroesophageal reflux disease)   . H/O hiatal hernia   . Heart murmur 1970   MVP/ asympotmatic  . Hyperlipidemia   . 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 Neuropathy    BOTH FEET AND LEGS-WORSE ON LEFT SIDE  . Pain    LOWER BACK WITH PROLONGED BENDING OVER AND STANDING- S/P SPINAL FUSION JAN 2015.  Marland Kitchen Peripheral vascular disease ([place])    venous insufficiency/ bilateral legs   . Plantar fasciitis    LEFT  . PONV (postoperative nausea and vomiting)    NO PROBLEMS WITH N&V LAST COUPLE OF SURGERIES  . Pulmonary embolism ([place]) 2008  . Restless leg syndrome   . Scoliosis   . Seizures ([place])    last seizure 1967- states reaction to COMPAZINE  . Shortness of breath    WITH EXERTION    Patient Active Problem List   Diagnosis Date Noted  . Severe obstructive sleep apnea-hypopnea syndrome 05/05/2019  . Adolescent idiopathic scoliosis of thoracolumbar region 03/30/2019  . Chronic pulmonary embolism without acute cor pulmonale ([place]) 03/30/2019  . Sleep-related hypoxia 03/30/2019  . Snoring 03/30/2019  . Rotator cuff tear arthropathy of right shoulder 11/27/2018  . Restless leg syndrome 09/08/2018  . S/P lumbar fusion 07/21/2014  . 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 S/P lumbar spinal fusion 06/03/2013  . Postoperative anemia due to acute blood loss 07/17/2012  . Failed total knee arthroplasty ([place]) 07/02/2012  . Status post repair of paraesophageal diaphragmatic hernia 05/21/2012  . Anxiety 01/23/2012  . Chronic cough 01/23/2012  . Depression 01/23/2012  . Hiatal hernia 01/23/2012  . Gastroesophageal reflux disease with hiatal hernia 01/23/2012  . Hypercholesteremia 01/23/2012    Past Surgical History:  Procedure Laterality Date  .  ABDOMINAL HYSTERECTOMY     VAGINAL HYSTERECTOMY WITH BSO  . COLONOSCOPY    . EXTERNAL EAR SURGERY     "release of nerve behind right ear for bells palsy"  . EYE SURGERY     cataracts  . JOINT REPLACEMENT  2003/2008   bilateral knees  . KNEE ARTHROSCOPY     right knee  . KNEE ARTHROSCOPY Right 01/27/2014   Procedure: ARTHROSCOPY RIGHT KNEE WITH SYNOVECTOMY;  Surgeon: Gearlean Alf, MD;  Location: [facility];  Service: Orthopedics;  Laterality: Right;  . MAXIMUM ACCESS (MAS)POSTERIOR LUMBAR INTERBODY FUSION (PLIF) 2 LEVEL N/A 06/03/2013   Procedure: FOR MAXIMUM ACCESS (MAS) POSTERIOR LUMBAR  INTERBODY FUSION (PLIF) 2 LEVEL;  Surgeon: Eustace Moore, MD;  Location: [facility];  Service: Neurosurgery;  Laterality: N/A;  FOR MAXIMUM ACCESS (MAS) POSTERIOR LUMBAR INTERBODY FUSION (PLIF) 2 LEVEL  . NISSEN FUNDOPLICATION  42/70  . REVERSE SHOULDER ARTHROPLASTY Right 11/27/2018   Procedure: REVERSE SHOULDER ARTHROPLASTY;  Surgeon: Verner Mould, MD;  Location: [facility];  Service: Orthopedics;  Laterality: Right;  134min  . TOTAL KNEE REVISION Right 07/02/2012   Procedure: TOTAL KNEE REVISION;  Surgeon: Gearlean Alf, MD;  Location: [facility];  Service: Orthopedics;  Laterality: Right;  . TUBAL LIGATION    . tummy tuck       OB History   No obstetric history on file.     Family History  Problem Relation Age of Onset  . Heart disease Mother   . Prostate cancer Father   . Pancreatic cancer Father   . Hypertension Father   . Diabetes Father   . Alcohol abuse Sister   . Hypertension Brother   . Liver cancer Paternal Uncle   . Colon cancer Paternal Grandmother     Social History   Tobacco Use  . Smoking status: Former Smoker    Packs/day: 0.25    Years: 10.00    Pack years: 2.50    Types: Cigarettes    Quit date: 06/25/1984    Years since quitting: 35.8  . Smokeless tobacco: Never Used  Substance Use Topics  . Alcohol use: Yes    Comment: 1 glass wine week  . Drug use: No    Home Medications Prior to Admission medications   Medication Sig Start Date End Date Taking? Authorizing Provider  ALPRAZolam Duanne Moron) 0.5 MG tablet Take 0.5 mg by mouth at bedtime as needed for anxiety.   Yes [provider]  ergocalciferol (VITAMIN D2) 1.25 MG (50000 UT) capsule Take 50,000 Units by mouth 2 (two) times a week.   Yes [provider]  ezetimibe (ZETIA) 10 MG tablet Take 10 mg by mouth daily.   Yes [provider]  ferrous sulfate 325 (65 FE) MG EC tablet Take 325 mg by mouth 3 (three) times daily with meals.   Yes [provider]    gabapentin (NEURONTIN) 300 MG capsule TAKE 1 CAPSULE (300 MG TOTAL) BY MOUTH 3 (THREE) TIMES DAILY. Patient taking differently: Take 600 mg by mouth at bedtime.  06/07/14  Yes Regal, Tamala Fothergill, DPM  ondansetron (ZOFRAN) 8 MG tablet Take 8 mg by mouth every 8 (eight) hours as needed for nausea or vomiting.    Yes [provider]  oxybutynin (DITROPAN-XL) 10 MG 24 hr tablet Take 10 mg by mouth at bedtime.   Yes [provider]  oxyCODONE (OXY IR/ROXICODONE) 5 MG immediate release tablet Take 5 mg by mouth in the morning and  at bedtime.   Yes [provider]  pantoprazole (PROTONIX) 40 MG tablet Take 40 mg by mouth daily.   Yes [provider]  polyethylene glycol (MIRALAX / GLYCOLAX) packet Take 17 g by mouth daily as needed for mild constipation.    Yes [provider]  pramipexole (MIRAPEX) 1 MG tablet Take 2 mg by mouth at bedtime.    Yes [provider]  pravastatin (PRAVACHOL) 80 MG tablet Take 80 mg by mouth daily.   Yes [provider]  traZODone (DESYREL) 100 MG tablet Take 100 mg by mouth at bedtime.  06/14/14  Yes [provider]    Allergies    Compazine [prochlorperazine edisylate] and Phenergan [promethazine]  Review of Systems   Review of Systems  All other systems reviewed and are negative.   Physical Exam Updated Vital Signs BP 121/74   Pulse 78   Temp 98.7 F (37.1 C) (Oral)   Resp 18   SpO2 97%   Physical Exam Vitals and nursing note reviewed.  Constitutional:      General: She is not in acute distress.    Appearance: She is well-developed.  HENT:     Head: Normocephalic and atraumatic.     Right Ear: External ear normal.     Left Ear: External ear normal.  Eyes:     General: No scleral icterus.       Right eye: No discharge.        Left eye: No discharge.     Conjunctiva/sclera: Conjunctivae normal.  Neck:     Trachea: No tracheal deviation.  Cardiovascular:     Rate and Rhythm: Normal  rate and regular rhythm.  Pulmonary:     Effort: Pulmonary effort is normal. No respiratory distress.     Breath sounds: Normal breath sounds. No stridor. No wheezing or rales.  Abdominal:     General: Bowel sounds are normal. There is no distension.     Palpations: Abdomen is soft.     Tenderness: There is no abdominal tenderness. There is no guarding or rebound.  Musculoskeletal:        General: No tenderness.     Cervical back: Neck supple.  Skin:    General: Skin is warm and dry.     Findings: No rash.  Neurological:     Mental Status: She is alert.     Cranial Nerves: No cranial nerve deficit (no facial droop, extraocular movements intact, no slurred speech).     Sensory: No sensory deficit.     Motor: No abnormal muscle tone or seizure activity.     Coordination: Coordination normal.  Psychiatric:        Mood and Affect: Mood is depressed.        Speech: Speech is not slurred or tangential.        Behavior: Behavior is withdrawn.        Thought Content: Thought content does not include homicidal ideation.     ED Results / Procedures / Treatments   Labs (all labs ordered are listed, but only abnormal results are displayed) Labs Reviewed  RAPID URINE DRUG SCREEN, HOSP PERFORMED - Abnormal; Notable for the following components:      Result Value   Opiates POSITIVE (*)    Benzodiazepines POSITIVE (*)    All other components within normal limits  SALICYLATE LEVEL - Abnormal; Notable for the following components:   Salicylate Lvl <7.9 (*)    All other components within normal limits  RESP PANEL BY RT-PCR (FLU A&B, COVID) ARPGX2  COMPREHENSIVE METABOLIC PANEL  ETHANOL  CBC WITH DIFFERENTIAL/PLATELET  ACETAMINOPHEN LEVEL    EKG EKG Interpretation  Date/Time:  Wednesday April 06 2020 11:21:08 EST Ventricular Rate:  63 PR Interval:    QRS Duration: 91 QT Interval:  389 QTC Calculation: 399 R Axis:   16 Text Interpretation: Sinus rhythm Prolonged PR interval Low  voltage, precordial leads Since last tracing rate slower Confirmed by Dorie Rank [id number]) on 04/06/2020 11:38:00 AM   Radiology No results found.  Procedures Procedures (including critical care time)  Medications Ordered in ED Medications  ALPRAZolam (XANAX) tablet 0.5 mg (has no administration in time range)  ezetimibe (ZETIA) tablet 10 mg (has no administration in time range)  ferrous sulfate EC tablet 325 mg (has no administration in time range)  gabapentin (NEURONTIN) capsule 600 mg (has no administration in time range)  ondansetron (ZOFRAN) tablet 8 mg (has no administration in time range)  oxybutynin (DITROPAN-XL) 24 hr tablet 10 mg (has no administration in time range)  oxyCODONE (Oxy IR/ROXICODONE) immediate release tablet 5 mg (has no administration in time range)  pantoprazole (PROTONIX) EC tablet 40 mg (has no administration in time range)  polyethylene glycol (MIRALAX / GLYCOLAX) packet 17 g (has no administration in time range)  pramipexole (MIRAPEX) tablet 2 mg (has no administration in time range)  pravastatin (PRAVACHOL) tablet 80 mg (has no administration in time range)  traZODone (DESYREL) tablet 100 mg (has no administration in time range)    ED Course  I have reviewed the triage vital signs and the nursing notes.  Pertinent labs & imaging results that were available during my care of the patient were reviewed by me and considered in my medical decision making (see chart for details).  Clinical Course as of Apr 06 1329  Wed Apr 06, 2020  1136 CBC with differential normal   [JK]    Clinical Course User Index [JK] Dorie Rank, MD   MDM Rules/Calculators/A&P                         Patient presents with depression and some vague suicidal thoughts.  Patient did take medications this morning but does not sound like an overdose.  Will check laboratory test.  Plan on consultation with psychiatry  Patient's lab reviewed.  Patient is medically cleared.  Patient was  assessed by TTS.  They recommend inpatient treatment.  The patient has been placed in psychiatric observation due to the need to provide a safe environment for the patient while obtaining psychiatric consultation and evaluation, as well as ongoing medical and medication management to treat the patient's condition.  The patient has not been placed under full IVC at this time.  Final Clinical Impression(s) / ED Diagnoses Final diagnoses:  Suicidal thoughts      Dorie Rank, MD 04/06/20 1331

## 2020-04-06 NOTE — BH Assessment (Addendum)
[facility] Assessment Progress Note  Per Harriett Sine, NP, this voluntary pt requires psychiatric hospitalization at this time, most likely at a facility providing specialty geriatric care.  The following facilities have been contacted to seek placement for this pt, with results as noted:  Beds available, information sent, decision pending: [facility]  Unable to reach: [facility] (left message at 12:28) [facility] (left message at 12:30)  At capacity: [facility]  If this voluntary pt is accepted to a facility, please discuss disposition with pt to be sure that she agrees to the plan.  If a facility agrees to accept pt and the plan changes in any way please call the facility to inform them of the change.  Final disposition is pending as of this writing.   Jalene Mullet, [place] Coordinator [phone]

## 2020-04-06 NOTE — ED Triage Notes (Signed)
Presents per EMS with c/o possible drug overdose. Upon arrival pt admits to taking 1 xanax 0.5 mg and 1 oxycodone, unsure of mg. Both meds are prescribed to her. At this time she denies and SI or HI.

## 2020-04-06 NOTE — BH Assessment (Signed)
Tele Assessment Note   Patient Name: Carrie Keller MRN: [id number] Referring Physician: EDP Location of Patient: [facility] Location of Provider: [place]  Carrie Keller is a 74 y.o. female who presented to [facility] on a voluntary basis with complaint of suicidal ideation, panic, despondency, and other symptoms.  Pt was referred by her daughter and PCP, who expressed concern over Pt's stated suicidal ideation.  Pt lives alone in [place].  She is unemployed and does not receive outpatient psychiatric or therapy services.  Pt has a primary care physician in [place].  She also receives pain management services from a clinic for treatment of scoliosis.  Pt reported as follows:  She stated that she has experienced significant panic daily for several months, and also that she feels persistently depressed.  Pt stated that she was recently suicidal (although denied SI at the time of assessment).  Pt endorsed the following symptoms:  Recent suicidal ideation without plan or intent; persistent despondency; isolation; tearfulness; daily panic; fatigue; guilt; irritability; hopelessness and worthlessness.  Pt denied hallucination, self-injurious behavior, and substance use concerns.  Pt also denied past suicide attempts.  Pt endorsed several stressors:  She recently lost over $100,000 in a ''lon[employer] hearts'' scam; she is estranged from her sister; she feels isolated from her daughter Carrie Keller who lives in [place].  Pt stated that she feels safer at the hospital because she is not sure what she would do if alone.  Author also spoke with Pt's daughter Carrie Keller.  Per daughter, Pt called her this morning to express suicidal ideation.  Daughter stated also that Pt has called her and Pt's sister several times over the last two weeks to express a desire to die.  Daughter stated that Pt is prescribed Xanax and Oxycontin, and she is concerned Pt will overdose.  During assessment, Pt presented as alert and  oriented.  She had good eye contact and was cooperative.  Pt was appropriately groomed.  Pt's mood was depressed, and affect was blunted.  Pt's speech was normal in rate, rhythm, and volume.  Pt's thought processes were within normal range, and thought content was logical and goal-oriented.  There was no evidence of delusion.  Memory and concentration were normal.  Judgment was poor.  Insight and impulse control were fair.  Consulted with Nicholos Johns, NP who determined that Pt meets inpatient criteria.  Diagnosis: Major Depressive Disorder, Recurrent, Severe w/o psychotic featuers  Past Medical History:  Past Medical History:  Diagnosis Date  . Anxiety    sees Dr Jerl Santos  psychiatrist every 3 month  . Arthritis   . Complication of anesthesia   . Depression   . DVT (deep venous thrombosis) ([place]) 2008   RIGHT /POST OP  . GERD (gastroesophageal reflux disease)   . H/O hiatal hernia   . Heart murmur 1970   MVP/ asympotmatic  . Hyperlipidemia   . 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 Neuropathy    BOTH FEET AND LEGS-WORSE ON LEFT SIDE  . Pain    LOWER BACK WITH PROLONGED BENDING OVER AND STANDING- S/P SPINAL FUSION JAN 2015.  Marland Kitchen Peripheral vascular disease ([place])    venous insufficiency/ bilateral legs  . Plantar fasciitis    LEFT  . PONV (postoperative nausea and vomiting)    NO PROBLEMS WITH N&V LAST COUPLE OF SURGERIES  . Pulmonary embolism ([place]) 2008  . Restless leg syndrome   . Scoliosis   .  Seizures ([place])    last seizure 1967- states reaction to COMPAZINE  . Shortness of breath    WITH EXERTION    Past Surgical History:  Procedure Laterality Date  . ABDOMINAL HYSTERECTOMY     VAGINAL HYSTERECTOMY WITH BSO  . COLONOSCOPY    . EXTERNAL EAR SURGERY     "release of nerve behind right ear for bells palsy"  . EYE SURGERY     cataracts  . JOINT REPLACEMENT  2003/2008   bilateral knees  . KNEE ARTHROSCOPY     right knee  . KNEE ARTHROSCOPY  Right 01/27/2014   Procedure: ARTHROSCOPY RIGHT KNEE WITH SYNOVECTOMY;  Surgeon: Gearlean Alf, MD;  [facility];  Service: Orthopedics;  Laterality: Right;  . MAXIMUM ACCESS (MAS)POSTERIOR LUMBAR INTERBODY FUSION (PLIF) 2 LEVEL N/A 06/03/2013   Procedure: FOR MAXIMUM ACCESS (MAS) POSTERIOR LUMBAR INTERBODY FUSION (PLIF) 2 LEVEL;  Surgeon: Eustace Moore, MD;  Location: [place];  Service: Neurosurgery;  Laterality: N/A;  FOR MAXIMUM ACCESS (MAS) POSTERIOR LUMBAR INTERBODY FUSION (PLIF) 2 LEVEL  . NISSEN FUNDOPLICATION  81/82  . REVERSE SHOULDER ARTHROPLASTY Right 11/27/2018   Procedure: REVERSE SHOULDER ARTHROPLASTY;  Surgeon: Verner Mould, MD;  Location: [place];  Service: Orthopedics;  Laterality: Right;  12min  . TOTAL KNEE REVISION Right 07/02/2012   Procedure: TOTAL KNEE REVISION;  Surgeon: Gearlean Alf, MD;  [facility];  Service: Orthopedics;  Laterality: Right;  . TUBAL LIGATION    . tummy tuck      Family History:  Family History  Problem Relation Age of Onset  . Heart disease Mother   . Prostate cancer Father   . Pancreatic cancer Father   . Hypertension Father   . Diabetes Father   . Alcohol abuse Sister   . Hypertension Brother   . Liver cancer Paternal Uncle   . Colon cancer Paternal Grandmother     Social History:  reports that she quit smoking about 35 years ago. Her smoking use included cigarettes. She has a 2.50 pack-year smoking history. She has never used smokeless tobacco. She reports current alcohol use. She reports that she does not use drugs.  Additional Social History:  Alcohol / Drug Use Pain Medications: See MAR Prescriptions: See MAR Over the Counter: See MAR History of alcohol / drug use?: No history of alcohol / drug abuse  CIWA: CIWA-Ar BP: 106/77 Pulse Rate: 63 COWS:    Allergies:  Allergies  Allergen Reactions  . Compazine [Prochlorperazine Edisylate] Other (See Comments)    Pt reports to having torticollis symptoms  and seizure activity with compazine  . Phenergan [Promethazine] Other (See Comments)    Seizures     Home Medications: (Not in a hospital admission)   OB/GYN Status:  No LMP recorded. Patient is postmenopausal.  General Assessment Data [facility] TTS Assessment: In system Is this a Tele or Face-to-Face Assessment?: Tele Assessment Is this an Initial Assessment or a Re-assessment for this encounter?: Initial Assessment Patient Accompanied by:: N/A Language Other than English: No Living Arrangements: Other (Comment) What gender do you identify as?: Female Date Telepsych consult ordered in [facility]: 04/06/20 Marital status: Widowed Pregnancy Status: No Living Arrangements: Alone Can pt return to current living arrangement?: Yes Admission Status: Voluntary Is patient capable of signing voluntary admission?: Yes Referral Source: Self/Family/Friend Insurance type: [employer]     Crisis Care Plan Living Arrangements: Alone Legal Guardian: Other: (Self) Name of Psychiatrist:  (None) Name  of Therapist:  (None)  Education Status Is patient currently in school?: No  Risk to self with the past 6 months Suicidal Ideation: Yes-Currently Present Has patient been a risk to self within the past 6 months prior to admission? : Yes Suicidal Intent: No Has patient had any suicidal intent within the past 6 months prior to admission? : No Suicidal Plan?: No Has patient had any suicidal plan within the past 6 months prior to admission? : No Access to Means: Yes Specify Access to Suicidal Means: Prescribed painkillers What has been your use of drugs/alcohol within the last 12 months?: None Previous Attempts/Gestures: No Intentional Self Injurious Behavior: None Family Suicide History: Unknown Recent stressful life event(s): Conflict (Comment), Financial Problems, Legal Issues Persecutory voices/beliefs?: No Depression: Yes Depression Symptoms: Despondent, Tearfulness,  Fatigue, Isolating, Guilt, Loss of interest in usual pleasures, Feeling worthless/self pity Substance abuse history and/or treatment for substance abuse?: No Suicide prevention information given to non-admitted patients: Not applicable  Risk to Others within the past 6 months Homicidal Ideation: No Does patient have any lifetime risk of violence toward others beyond the six months prior to admission? : No Thoughts of Harm to Others: No Current Homicidal Intent: No Current Homicidal Plan: No Access to Homicidal Means: No History of harm to others?: No Does patient have access to weapons?: No Criminal Charges Pending?: No Does patient have a court date: No Is patient on probation?: No  Psychosis Hallucinations: None noted Delusions: None noted  Mental Status Report Appearance/Hygiene: Unremarkable Eye Contact: Good Motor Activity: Freedom of movement Speech: Logical/coherent Level of Consciousness: Alert Mood: Depressed Affect: Blunted Anxiety Level: Panic Attacks Panic attack frequency:  (Daily) Most recent panic attack:  (04/06/2020) Thought Processes: Coherent, Relevant Judgement: Impaired Orientation: Person, Place, Time, Situation Obsessive Compulsive Thoughts/Behaviors: None  Cognitive Functioning Concentration: Normal Memory: Recent Intact, Remote Intact Is patient IDD: No Insight: Fair Impulse Control: Fair Appetite: Fair Have you had any weight changes? : No Change Sleep: Decreased Total Hours of Sleep:  (Mixed) Vegetative Symptoms: None  ADLScreening [facility] Assessment Services) Patient's cognitive ability adequate to safely complete daily activities?: Yes Patient able to express need for assistance with ADLs?: Yes Independently performs ADLs?: Yes (appropriate for developmental age)  Prior Inpatient Therapy Prior Inpatient Therapy: No  Prior Outpatient Therapy Prior Outpatient Therapy: Yes Prior Therapy Facilty/Provider(s):  (Provider in  [place]) Reason for Treatment:  (Anxiety) Does patient have an ACCT team?: No Does patient have Intensive In-House Services?  : No Does patient have [facility] services? : No Does patient have P4CC services?: No  ADL Screening (condition at time of admission) Patient's cognitive ability adequate to safely complete daily activities?: Yes Is the patient deaf or have difficulty hearing?: No Does the patient have difficulty seeing, even when wearing glasses/contacts?: No Does the patient have difficulty concentrating, remembering, or making decisions?: No Patient able to express need for assistance with ADLs?: Yes Does the patient have difficulty dressing or bathing?: No Independently performs ADLs?: Yes (appropriate for developmental age) Does the patient have difficulty walking or climbing stairs?: No Weakness of Legs: None Weakness of Arms/Hands: None  Home Assistive Devices/Equipment Home Assistive Devices/Equipment: None  Therapy Consults (therapy consults require a physician order) PT Evaluation Needed: No OT Evalulation Needed: No SLP Evaluation Needed: No Abuse/Neglect Assessment (Assessment to be complete while patient is alone) Abuse/Neglect Assessment Can Be Completed: Yes Physical Abuse: Denies Verbal Abuse: Denies Sexual Abuse: Denies Exploitation of patient/patient's resources: Denies Self-Neglect: Denies Values / Beliefs Cultural Requests  During Hospitalization: None Spiritual Requests During Hospitalization: None Consults Spiritual Care Consult Needed: No Transition of Care Team Consult Needed: No Advance Directives (For Healthcare) Does Patient Have a Medical Advance Directive?: No          Disposition:  Disposition Initial Assessment Completed for this Encounter: Yes Disposition of Patient: Admit Type of inpatient treatment program: Adult  This service was provided via telemedicine using a 2-way, interactive audio and video technology.  Names of all  persons participating in this telemedicine service and their role in this encounter. Name: Warner Mccreedy Role: Patient             Marlowe Aschoff 04/06/2020 1:03 PM

## 2020-04-10 DIAGNOSIS — R69 Illness, unspecified: Secondary | ICD-10-CM | POA: Diagnosis not present

## 2020-04-13 ENCOUNTER — Other Ambulatory Visit: Payer: Self-pay

## 2020-04-13 ENCOUNTER — Encounter (HOSPITAL_BASED_OUTPATIENT_CLINIC_OR_DEPARTMENT_OTHER): Payer: Self-pay | Admitting: *Deleted

## 2020-04-13 ENCOUNTER — Emergency Department (HOSPITAL_BASED_OUTPATIENT_CLINIC_OR_DEPARTMENT_OTHER)
Admission: EM | Admit: 2020-04-13 | Discharge: 2020-04-13 | Disposition: A | Discharge status: Home / Self Care | Payer: Medicare HMO | Attending: Emergency Medicine | Admitting: Emergency Medicine

## 2020-04-13 DIAGNOSIS — Z86711 Personal history of pulmonary embolism: Secondary | ICD-10-CM | POA: Diagnosis not present

## 2020-04-13 DIAGNOSIS — Z79899 Other long term (current) drug therapy: Secondary | ICD-10-CM | POA: Diagnosis not present

## 2020-04-13 DIAGNOSIS — R69 Illness, unspecified: Secondary | ICD-10-CM | POA: Diagnosis not present

## 2020-04-13 DIAGNOSIS — G629 Polyneuropathy, unspecified: Secondary | ICD-10-CM | POA: Diagnosis not present

## 2020-04-13 DIAGNOSIS — F419 Anxiety disorder, unspecified: Secondary | ICD-10-CM | POA: Insufficient documentation

## 2020-04-13 DIAGNOSIS — Z96653 Presence of artificial knee joint, bilateral: Secondary | ICD-10-CM | POA: Diagnosis not present

## 2020-04-13 DIAGNOSIS — Z6828 Body mass index (BMI) 28.0-28.9, adult: Secondary | ICD-10-CM | POA: Diagnosis not present

## 2020-04-13 DIAGNOSIS — Z7689 Persons encountering health services in other specified circumstances: Secondary | ICD-10-CM | POA: Diagnosis not present

## 2020-04-13 DIAGNOSIS — F410 Panic disorder [episodic paroxysmal anxiety] without agoraphobia: Secondary | ICD-10-CM | POA: Insufficient documentation

## 2020-04-13 DIAGNOSIS — Z87891 Personal history of nicotine dependence: Secondary | ICD-10-CM | POA: Diagnosis not present

## 2020-04-13 MED ORDER — HALOPERIDOL LACTATE 5 MG/ML IJ SOLN
2.0000 mg | Freq: Once | INTRAMUSCULAR | Status: AC
  Administered 2020-04-13: 2 mg via INTRAMUSCULAR
  Filled 2020-04-13: qty 1

## 2020-04-13 MED ORDER — GABAPENTIN 300 MG PO CAPS
300.0000 mg | ORAL_CAPSULE | Freq: Once | ORAL | Status: AC
  Administered 2020-04-13: 300 mg via ORAL
  Filled 2020-04-13: qty 1

## 2020-04-13 MED ORDER — LORAZEPAM 2 MG/ML IJ SOLN
1.0000 mg | Freq: Once | INTRAMUSCULAR | Status: AC
  Administered 2020-04-13: 1 mg via INTRAMUSCULAR
  Filled 2020-04-13: qty 1

## 2020-04-13 NOTE — Discharge Instructions (Signed)
Continue taking your Vistaril for anxiety.  See your psychiatrist and therapist.  Return to ER if you have thoughts of harming yourself or others or hallucinations.

## 2020-04-13 NOTE — ED Provider Notes (Signed)
[facility] EMERGENCY DEPARTMENT Provider Note   CSN: [id number] Arrival date & time: 04/13/20  1604     History Chief Complaint  Patient presents with  . Panic Attack    Carrie Keller is a 74 y.o. female hx of DVT, depression, here presenting with anxiety and panic attacks.  Patient was recently admitted to psychiatric facility after she states that she wanted to kill herself.  She apparently was scammed and she lost all her savings.  She then was thinking of overdosing on her pain medicine and Xanax.  During the hospitalization, patient was taken off of Xanax.  Patient states that she has been home for the last 3 days.  She states that she was doing well initially.  She did that since yesterday she has severe anxiety.  She states that she has panic attacks.  She went to her doctor who refused to prescribe anxiety medicine.  Patient then drove to [facility] was unable to get an appointment. Patient then came here for evaluation.  She denies thoughts of harming herself or others.  Denies any hallucinations.  The history is provided by the patient.       Past Medical History:  Diagnosis Date  . Anxiety    sees Dr Jerl Santos  psychiatrist every 3 month  . Arthritis   . Complication of anesthesia   . Depression   . DVT (deep venous thrombosis) ([place]) 2008   RIGHT /POST OP  . GERD (gastroesophageal reflux disease)   . H/O hiatal hernia   . Heart murmur 1970   MVP/ asympotmatic  . Hyperlipidemia   . 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 Neuropathy    BOTH FEET AND LEGS-WORSE ON LEFT SIDE  . Pain    LOWER BACK WITH PROLONGED BENDING OVER AND STANDING- S/P SPINAL FUSION JAN 2015.  Marland Kitchen Peripheral vascular disease ([place])    venous insufficiency/ bilateral legs  . Plantar fasciitis    LEFT  . PONV (postoperative nausea and vomiting)    NO PROBLEMS WITH N&V LAST COUPLE OF SURGERIES  . Pulmonary embolism ([place]) 2008  . Restless leg syndrome   .  Scoliosis   . Seizures ([place])    last seizure 1967- states reaction to COMPAZINE  . Shortness of breath    WITH EXERTION    Patient Active Problem List   Diagnosis Date Noted  . Severe obstructive sleep apnea-hypopnea syndrome 05/05/2019  . Adolescent idiopathic scoliosis of thoracolumbar region 03/30/2019  . Chronic pulmonary embolism without acute cor pulmonale ([place]) 03/30/2019  . Sleep-related hypoxia 03/30/2019  . Snoring 03/30/2019  . Rotator cuff tear arthropathy of right shoulder 11/27/2018  . Restless leg syndrome 09/08/2018  . S/P lumbar fusion 07/21/2014  . 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 S/P lumbar spinal fusion 06/03/2013  . Postoperative anemia due to acute blood loss 07/17/2012  . Failed total knee arthroplasty ([place]) 07/02/2012  . Status post repair of paraesophageal diaphragmatic hernia 05/21/2012  . Anxiety 01/23/2012  . Chronic cough 01/23/2012  . Depression 01/23/2012  . Hiatal hernia 01/23/2012  . Gastroesophageal reflux disease with hiatal hernia 01/23/2012  . Hypercholesteremia 01/23/2012    Past Surgical History:  Procedure Laterality Date  . ABDOMINAL HYSTERECTOMY     VAGINAL HYSTERECTOMY WITH BSO  . COLONOSCOPY    . EXTERNAL EAR SURGERY     "release of nerve behind right ear for bells palsy"  . EYE  SURGERY     cataracts  . JOINT REPLACEMENT  2003/2008   bilateral knees  . KNEE ARTHROSCOPY     right knee  . KNEE ARTHROSCOPY Right 01/27/2014   Procedure: ARTHROSCOPY RIGHT KNEE WITH SYNOVECTOMY;  Surgeon: Gearlean Alf, MD;  [facility];  Service: Orthopedics;  Laterality: Right;  . MAXIMUM ACCESS (MAS)POSTERIOR LUMBAR INTERBODY FUSION (PLIF) 2 LEVEL N/A 06/03/2013   Procedure: FOR MAXIMUM ACCESS (MAS) POSTERIOR LUMBAR INTERBODY FUSION (PLIF) 2 LEVEL;  Surgeon: Eustace Moore, MD;  Location: [facility];  Service: Neurosurgery;  Laterality: N/A;  FOR MAXIMUM ACCESS (MAS) POSTERIOR LUMBAR INTERBODY FUSION  (PLIF) 2 LEVEL  . NISSEN FUNDOPLICATION  90/24  . REVERSE SHOULDER ARTHROPLASTY Right 11/27/2018   Procedure: REVERSE SHOULDER ARTHROPLASTY;  Surgeon: Verner Mould, MD;  Location: [place];  Service: Orthopedics;  Laterality: Right;  183min  . TOTAL KNEE REVISION Right 07/02/2012   Procedure: TOTAL KNEE REVISION;  Surgeon: Gearlean Alf, MD;  [facility];  Service: Orthopedics;  Laterality: Right;  . TUBAL LIGATION    . tummy tuck       OB History   No obstetric history on file.     Family History  Problem Relation Age of Onset  . Heart disease Mother   . Prostate cancer Father   . Pancreatic cancer Father   . Hypertension Father   . Diabetes Father   . Alcohol abuse Sister   . Hypertension Brother   . Liver cancer Paternal Uncle   . Colon cancer Paternal Grandmother     Social History   Tobacco Use  . Smoking status: Former Smoker    Packs/day: 0.25    Years: 10.00    Pack years: 2.50    Types: Cigarettes    Quit date: 06/25/1984    Years since quitting: 35.8  . Smokeless tobacco: Never Used  Substance Use Topics  . Alcohol use: Yes    Comment: 1 glass wine week  . Drug use: No    Home Medications Prior to Admission medications   Medication Sig Start Date End Date Taking? Authorizing Provider  ALPRAZolam Duanne Moron) 0.5 MG tablet Take 0.5 mg by mouth at bedtime as needed for anxiety.    [provider]  ergocalciferol (VITAMIN D2) 1.25 MG (50000 UT) capsule Take 50,000 Units by mouth 2 (two) times a week.    [provider]  ezetimibe (ZETIA) 10 MG tablet Take 10 mg by mouth daily.    [provider]  ferrous sulfate 325 (65 FE) MG EC tablet Take 325 mg by mouth 3 (three) times daily with meals.    [provider]  gabapentin (NEURONTIN) 300 MG capsule TAKE 1 CAPSULE (300 MG TOTAL) BY MOUTH 3 (THREE) TIMES DAILY. Patient taking differently: Take 600 mg by mouth at bedtime.  06/07/14   Wallene Huh, DPM  ondansetron  (ZOFRAN) 8 MG tablet Take 8 mg by mouth every 8 (eight) hours as needed for nausea or vomiting.     [provider]  oxybutynin (DITROPAN-XL) 10 MG 24 hr tablet Take 10 mg by mouth at bedtime.    [provider]  oxyCODONE (OXY IR/ROXICODONE) 5 MG immediate release tablet Take 5 mg by mouth in the morning and at bedtime.    [provider]  pantoprazole (PROTONIX) 40 MG tablet Take 40 mg by mouth daily.    [provider]  polyethylene glycol (MIRALAX / GLYCOLAX) packet Take 17 g by  mouth daily as needed for mild constipation.     [provider]  pramipexole (MIRAPEX) 1 MG tablet Take 2 mg by mouth at bedtime.     [provider]  pravastatin (PRAVACHOL) 80 MG tablet Take 80 mg by mouth daily.    [provider]  traZODone (DESYREL) 100 MG tablet Take 100 mg by mouth at bedtime.  06/14/14   [provider]    Allergies    Compazine [prochlorperazine edisylate] and Phenergan [promethazine]  Review of Systems   Review of Systems  Psychiatric/Behavioral: The patient is nervous/anxious.   All other systems reviewed and are negative.   Physical Exam Updated Vital Signs BP (!) 147/76 (BP Location: Right Arm)   Pulse 85   Temp 98.6 F (37 C) (Oral)   Resp 16   Ht 5' 1.5" (1.562 m)   Wt 68 kg   SpO2 95%   BMI 27.88 kg/m   Physical Exam Vitals and nursing note reviewed.  Constitutional:      Comments: Anxious   HENT:     Head: Normocephalic.     Nose: Nose normal.     Mouth/Throat:     Mouth: Mucous membranes are moist.  Eyes:     Extraocular Movements: Extraocular movements intact.     Pupils: Pupils are equal, round, and reactive to light.  Cardiovascular:     Rate and Rhythm: Normal rate and regular rhythm.     Pulses: Normal pulses.     Heart sounds: Normal heart sounds.  Pulmonary:     Effort: Pulmonary effort is normal.     Breath sounds: Normal breath sounds.  Abdominal:     General: Abdomen is  flat.     Palpations: Abdomen is soft.  Musculoskeletal:        General: Normal range of motion.     Cervical back: Normal range of motion and neck supple.  Skin:    General: Skin is warm.     Capillary Refill: Capillary refill takes less than 2 seconds.  Neurological:     General: No focal deficit present.  Psychiatric:     Comments: Anxious      ED Results / Procedures / Treatments   Labs (all labs ordered are listed, but only abnormal results are displayed) Labs Reviewed - No data to display  EKG None  Radiology No results found.  Procedures Procedures (including critical care time)  Medications Ordered in ED Medications  LORazepam (ATIVAN) injection 1 mg (1 mg Intramuscular Given 04/13/20 1753)  haloperidol lactate (HALDOL) injection 2 mg (2 mg Intramuscular Given 04/13/20 1754)  gabapentin (NEURONTIN) capsule 300 mg (300 mg Oral Given 04/13/20 1847)    ED Course  I have reviewed the triage vital signs and the nursing notes.  Pertinent labs & imaging results that were available during my care of the patient were reviewed by me and considered in my medical decision making (see chart for details).    MDM Rules/Calculators/A&P                          Carrie Keller is a 74 y.o. female here presenting with panic attack.  Patient was taken off Xanax since she was thinking of overdose recently.  Patient appears very anxious.  On the other hand she is not tachycardic to suggest benzo withdrawal.  Currently denies any suicidal or homicidal ideation.  We will give some Ativan and Haldol and reassess.  7:16  PM Patient felt better after Ativan and Haldol.  Patient has a ride to come and pick her up.  Told her to continue Vistaril and follow-up with her psychiatrist.  Final Clinical Impression(s) / ED Diagnoses Final diagnoses:  Panic attack    Rx / DC Orders ED Discharge Orders    None       Drenda Freeze, MD 04/13/20 [phone]

## 2020-04-13 NOTE — ED Triage Notes (Signed)
C/o " panic attack"  X 1 day, pt recently taken off xanax and oxycodone

## 2020-04-18 DIAGNOSIS — R69 Illness, unspecified: Secondary | ICD-10-CM | POA: Diagnosis not present

## 2020-04-21 DIAGNOSIS — M48061 Spinal stenosis, lumbar region without neurogenic claudication: Secondary | ICD-10-CM | POA: Diagnosis not present

## 2020-04-27 DIAGNOSIS — D509 Iron deficiency anemia, unspecified: Secondary | ICD-10-CM | POA: Diagnosis not present

## 2020-04-27 DIAGNOSIS — R69 Illness, unspecified: Secondary | ICD-10-CM | POA: Diagnosis not present

## 2020-04-27 DIAGNOSIS — D519 Vitamin B12 deficiency anemia, unspecified: Secondary | ICD-10-CM | POA: Diagnosis not present

## 2020-04-27 DIAGNOSIS — Z6829 Body mass index (BMI) 29.0-29.9, adult: Secondary | ICD-10-CM | POA: Diagnosis not present

## 2020-04-27 DIAGNOSIS — Z9181 History of falling: Secondary | ICD-10-CM | POA: Diagnosis not present

## 2020-05-02 DIAGNOSIS — Z1331 Encounter for screening for depression: Secondary | ICD-10-CM | POA: Diagnosis not present

## 2020-05-02 DIAGNOSIS — Z9181 History of falling: Secondary | ICD-10-CM | POA: Diagnosis not present

## 2020-05-02 DIAGNOSIS — Z0000 Encounter for general adult medical examination without abnormal findings: Secondary | ICD-10-CM | POA: Diagnosis not present

## 2020-05-02 DIAGNOSIS — E785 Hyperlipidemia, unspecified: Secondary | ICD-10-CM | POA: Diagnosis not present

## 2020-06-10 DIAGNOSIS — M5030 Other cervical disc degeneration, unspecified cervical region: Secondary | ICD-10-CM | POA: Diagnosis not present

## 2020-06-22 DIAGNOSIS — M19111 Post-traumatic osteoarthritis, right shoulder: Secondary | ICD-10-CM | POA: Diagnosis not present

## 2020-06-23 DIAGNOSIS — M5412 Radiculopathy, cervical region: Secondary | ICD-10-CM | POA: Diagnosis not present

## 2020-06-29 DIAGNOSIS — E538 Deficiency of other specified B group vitamins: Secondary | ICD-10-CM | POA: Diagnosis not present

## 2020-06-29 DIAGNOSIS — E785 Hyperlipidemia, unspecified: Secondary | ICD-10-CM | POA: Diagnosis not present

## 2020-06-29 DIAGNOSIS — G2581 Restless legs syndrome: Secondary | ICD-10-CM | POA: Diagnosis not present

## 2020-06-29 DIAGNOSIS — D509 Iron deficiency anemia, unspecified: Secondary | ICD-10-CM | POA: Diagnosis not present

## 2020-06-29 DIAGNOSIS — R69 Illness, unspecified: Secondary | ICD-10-CM | POA: Diagnosis not present

## 2020-06-29 DIAGNOSIS — G629 Polyneuropathy, unspecified: Secondary | ICD-10-CM | POA: Diagnosis not present

## 2020-06-29 DIAGNOSIS — R42 Dizziness and giddiness: Secondary | ICD-10-CM | POA: Diagnosis not present

## 2020-06-29 DIAGNOSIS — R739 Hyperglycemia, unspecified: Secondary | ICD-10-CM | POA: Diagnosis not present

## 2020-06-29 DIAGNOSIS — J449 Chronic obstructive pulmonary disease, unspecified: Secondary | ICD-10-CM | POA: Diagnosis not present

## 2020-07-20 ENCOUNTER — Emergency Department (HOSPITAL_BASED_OUTPATIENT_CLINIC_OR_DEPARTMENT_OTHER): Payer: Medicare HMO

## 2020-07-20 ENCOUNTER — Emergency Department (HOSPITAL_BASED_OUTPATIENT_CLINIC_OR_DEPARTMENT_OTHER)
Admission: EM | Admit: 2020-07-20 | Discharge: 2020-07-20 | Disposition: A | Discharge status: Home / Self Care | Payer: Medicare HMO | Attending: Emergency Medicine | Admitting: Emergency Medicine

## 2020-07-20 ENCOUNTER — Encounter (HOSPITAL_BASED_OUTPATIENT_CLINIC_OR_DEPARTMENT_OTHER): Payer: Self-pay | Admitting: *Deleted

## 2020-07-20 ENCOUNTER — Other Ambulatory Visit: Payer: Self-pay

## 2020-07-20 DIAGNOSIS — J069 Acute upper respiratory infection, unspecified: Secondary | ICD-10-CM | POA: Insufficient documentation

## 2020-07-20 DIAGNOSIS — F410 Panic disorder [episodic paroxysmal anxiety] without agoraphobia: Secondary | ICD-10-CM | POA: Diagnosis not present

## 2020-07-20 DIAGNOSIS — Z87891 Personal history of nicotine dependence: Secondary | ICD-10-CM | POA: Insufficient documentation

## 2020-07-20 DIAGNOSIS — R4182 Altered mental status, unspecified: Secondary | ICD-10-CM | POA: Insufficient documentation

## 2020-07-20 DIAGNOSIS — Z20822 Contact with and (suspected) exposure to covid-19: Secondary | ICD-10-CM | POA: Insufficient documentation

## 2020-07-20 DIAGNOSIS — R9431 Abnormal electrocardiogram [ECG] [EKG]: Secondary | ICD-10-CM | POA: Diagnosis not present

## 2020-07-20 DIAGNOSIS — B9789 Other viral agents as the cause of diseases classified elsewhere: Secondary | ICD-10-CM | POA: Diagnosis not present

## 2020-07-20 DIAGNOSIS — Z96651 Presence of right artificial knee joint: Secondary | ICD-10-CM | POA: Diagnosis not present

## 2020-07-20 DIAGNOSIS — R531 Weakness: Secondary | ICD-10-CM | POA: Diagnosis not present

## 2020-07-20 DIAGNOSIS — Z96652 Presence of left artificial knee joint: Secondary | ICD-10-CM | POA: Diagnosis not present

## 2020-07-20 DIAGNOSIS — R059 Cough, unspecified: Secondary | ICD-10-CM | POA: Diagnosis not present

## 2020-07-20 DIAGNOSIS — R69 Illness, unspecified: Secondary | ICD-10-CM | POA: Diagnosis not present

## 2020-07-20 LAB — CBC WITH DIFFERENTIAL/PLATELET
Abs Immature Granulocytes: 0.01 10*3/uL (ref 0.00–0.07)
Basophils Absolute: 0 10*3/uL (ref 0.0–0.1)
Basophils Relative: 0 %
Eosinophils Absolute: 0.1 10*3/uL (ref 0.0–0.5)
Eosinophils Relative: 1 %
HCT: 40.8 % (ref 36.0–46.0)
Hemoglobin: 13.1 g/dL (ref 12.0–15.0)
Immature Granulocytes: 0 %
Lymphocytes Relative: 24 %
Lymphs Abs: 1.6 10*3/uL (ref 0.7–4.0)
MCH: 29.1 pg (ref 26.0–34.0)
MCHC: 32.1 g/dL (ref 30.0–36.0)
MCV: 90.7 fL (ref 80.0–100.0)
Monocytes Absolute: 0.5 10*3/uL (ref 0.1–1.0)
Monocytes Relative: 7 %
Neutro Abs: 4.3 10*3/uL (ref 1.7–7.7)
Neutrophils Relative %: 68 %
Platelets: 247 10*3/uL (ref 150–400)
RBC: 4.5 MIL/uL (ref 3.87–5.11)
RDW: 13.6 % (ref 11.5–15.5)
WBC: 6.4 10*3/uL (ref 4.0–10.5)
nRBC: 0 % (ref 0.0–0.2)

## 2020-07-20 LAB — COMPREHENSIVE METABOLIC PANEL
ALT: 22 U/L (ref 0–44)
AST: 23 U/L (ref 15–41)
Albumin: 3.6 g/dL (ref 3.5–5.0)
Alkaline Phosphatase: 69 U/L (ref 38–126)
Anion gap: 9 (ref 5–15)
BUN: 8 mg/dL (ref 8–23)
CO2: 25 mmol/L (ref 22–32)
Calcium: 9.2 mg/dL (ref 8.9–10.3)
Chloride: 106 mmol/L (ref 98–111)
Creatinine, Ser: 0.71 mg/dL (ref 0.44–1.00)
GFR, Estimated: >60 mL/min (ref >60)
Glucose, Bld: 104 mg/dL — ABNORMAL HIGH (ref 70–99)
Potassium: 3.6 mmol/L (ref 3.5–5.1)
Sodium: 140 mmol/L (ref 135–145)
Total Bilirubin: 0.2 mg/dL — ABNORMAL LOW (ref 0.3–1.2)
Total Protein: 6.4 g/dL — ABNORMAL LOW (ref 6.5–8.1)

## 2020-07-20 LAB — LIPASE, BLOOD: Lipase: 30 U/L (ref 11–51)

## 2020-07-20 LAB — URINALYSIS, ROUTINE W REFLEX MICROSCOPIC
Bilirubin Urine: NEGATIVE
Glucose, UA: NEGATIVE mg/dL
Ketones, ur: NEGATIVE mg/dL
Leukocytes,Ua: NEGATIVE
Nitrite: NEGATIVE
Protein, ur: NEGATIVE mg/dL
Specific Gravity, Urine: 1.01 (ref 1.005–1.030)
pH: 7 (ref 5.0–8.0)

## 2020-07-20 LAB — URINALYSIS, MICROSCOPIC (REFLEX): WBC, UA: NONE SEEN WBC/hpf (ref 0–5)

## 2020-07-20 LAB — RESP PANEL BY RT-PCR (FLU A&B, COVID) ARPGX2
Influenza A by PCR: NEGATIVE
Influenza B by PCR: NEGATIVE
SARS Coronavirus 2 by RT PCR: NEGATIVE

## 2020-07-20 LAB — RAPID URINE DRUG SCREEN, HOSP PERFORMED
Amphetamines: NOT DETECTED
Barbiturates: NOT DETECTED
Benzodiazepines: NOT DETECTED
Cocaine: NOT DETECTED
Opiates: NOT DETECTED
Tetrahydrocannabinol: NOT DETECTED

## 2020-07-20 LAB — CBG MONITORING, ED: Glucose-Capillary: 87 mg/dL (ref 70–99)

## 2020-07-20 LAB — TROPONIN I (HIGH SENSITIVITY): Troponin I (High Sensitivity): 4 ng/L (ref <18)

## 2020-07-20 LAB — LACTIC ACID, PLASMA: Lactic Acid, Venous: 1.3 mmol/L (ref 0.5–1.9)

## 2020-07-20 MED ORDER — NALOXONE HCL 0.4 MG/ML IJ SOLN
0.4000 mg | Freq: Once | INTRAMUSCULAR | Status: AC
  Administered 2020-07-20: 0.4 mg via INTRAVENOUS
  Filled 2020-07-20: qty 1

## 2020-07-20 MED ORDER — LACTATED RINGERS IV BOLUS
1000.0000 mL | Freq: Once | INTRAVENOUS | Status: AC
  Administered 2020-07-20: 1000 mL via INTRAVENOUS

## 2020-07-20 NOTE — ED Triage Notes (Signed)
C/o nausea, felling panic today,, over all feels bad x 4-5 days

## 2020-07-20 NOTE — ED Provider Notes (Signed)
[facility] EMERGENCY DEPARTMENT Provider Note   CSN: [id number] Arrival date & time: 07/20/20  1345     History Chief Complaint  Patient presents with  . Weakness    Carrie Keller is a 75 y.o. female history of anxiety, depression, DVT, GERD, MVP, hyperlipidemia, Bell's palsy, neuropathy, PVD, PE, RLS, seizures.  Patient reports 5 days ago she began feeling ill she reports that she developed rhinorrhea, body aches and a mild nonproductive cough.  If symptoms have been constant for the past 5days she feels that her body aches and rhinorrhea have been gradually improving.  She reports that she lives alone and has no known sick contacts she reports she is vaccinated against COVID-19.  Patient reports around 4 AM this morning she woke up feeling nauseous, she believes this caused her to have a panic attack today.  She reports feeling very anxious and generally weak today and the nausea has continued which prompted her ER visit.  Patient denies fever, headache, vision changes, neck stiffness, chest pain/shortness of breath, hemoptysis, sputum production, abdominal pain, vomiting, diarrhea, extremity swelling/color change, unilateral weakness or any additional concerns  HPI     Past Medical History:  Diagnosis Date  . Anxiety    sees Dr Jerl Santos  psychiatrist every 3 month  . Arthritis   . Complication of anesthesia   . Depression   . DVT (deep venous thrombosis) ([place]) 2008   RIGHT /POST OP  . GERD (gastroesophageal reflux disease)   . H/O hiatal hernia   . Heart murmur 1970   MVP/ asympotmatic  . Hyperlipidemia   . 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 Neuropathy    BOTH FEET AND LEGS-WORSE ON LEFT SIDE  . Pain    LOWER BACK WITH PROLONGED BENDING OVER AND STANDING- S/P SPINAL FUSION JAN 2015.  Marland Kitchen Peripheral vascular disease ([place])    venous insufficiency/ bilateral legs  . Plantar fasciitis    LEFT  . PONV (postoperative nausea  and vomiting)    NO PROBLEMS WITH N&V LAST COUPLE OF SURGERIES  . Pulmonary embolism ([place]) 2008  . Restless leg syndrome   . Scoliosis   . Seizures ([place])    last seizure 1967- states reaction to COMPAZINE  . Shortness of breath    WITH EXERTION    Patient Active Problem List   Diagnosis Date Noted  . Severe obstructive sleep apnea-hypopnea syndrome 05/05/2019  . Adolescent idiopathic scoliosis of thoracolumbar region 03/30/2019  . Chronic pulmonary embolism without acute cor pulmonale ([place]) 03/30/2019  . Sleep-related hypoxia 03/30/2019  . Snoring 03/30/2019  . Rotator cuff tear arthropathy of right shoulder 11/27/2018  . Restless leg syndrome 09/08/2018  . S/P lumbar fusion 07/21/2014  . 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 S/P lumbar spinal fusion 06/03/2013  . Postoperative anemia due to acute blood loss 07/17/2012  . Failed total knee arthroplasty ([place]) 07/02/2012  . Status post repair of paraesophageal diaphragmatic hernia 05/21/2012  . Anxiety 01/23/2012  . Chronic cough 01/23/2012  . Depression 01/23/2012  . Hiatal hernia 01/23/2012  . Gastroesophageal reflux disease with hiatal hernia 01/23/2012  . Hypercholesteremia 01/23/2012    Past Surgical History:  Procedure Laterality Date  . ABDOMINAL HYSTERECTOMY     VAGINAL HYSTERECTOMY WITH BSO  . COLONOSCOPY    . EXTERNAL EAR SURGERY     "release of nerve behind right ear for bells palsy"  . EYE SURGERY  cataracts  . JOINT REPLACEMENT  2003/2008   bilateral knees  . KNEE ARTHROSCOPY     right knee  . KNEE ARTHROSCOPY Right 01/27/2014   Procedure: ARTHROSCOPY RIGHT KNEE WITH SYNOVECTOMY;  Surgeon: Gearlean Alf, MD;  Location: [facility];  Service: Orthopedics;  Laterality: Right;  . MAXIMUM ACCESS (MAS)POSTERIOR LUMBAR INTERBODY FUSION (PLIF) 2 LEVEL N/A 06/03/2013   Procedure: FOR MAXIMUM ACCESS (MAS) POSTERIOR LUMBAR INTERBODY FUSION (PLIF) 2 LEVEL;  Surgeon: Eustace Moore, MD;   Location: [facility];  Service: Neurosurgery;  Laterality: N/A;  FOR MAXIMUM ACCESS (MAS) POSTERIOR LUMBAR INTERBODY FUSION (PLIF) 2 LEVEL  . NISSEN FUNDOPLICATION  53/66  . REVERSE SHOULDER ARTHROPLASTY Right 11/27/2018   Procedure: REVERSE SHOULDER ARTHROPLASTY;  Surgeon: Verner Mould, MD;  Location: [facility];  Service: Orthopedics;  Laterality: Right;  119min  . TOTAL KNEE REVISION Right 07/02/2012   Procedure: TOTAL KNEE REVISION;  Surgeon: Gearlean Alf, MD;  Location: [facility];  Service: Orthopedics;  Laterality: Right;  . TUBAL LIGATION    . tummy tuck       OB History   No obstetric history on file.     Family History  Problem Relation Age of Onset  . Heart disease Mother   . Prostate cancer Father   . Pancreatic cancer Father   . Hypertension Father   . Diabetes Father   . Alcohol abuse Sister   . Hypertension Brother   . Liver cancer Paternal Uncle   . Colon cancer Paternal Grandmother     Social History   Tobacco Use  . Smoking status: Former Smoker    Packs/day: 0.25    Years: 10.00    Pack years: 2.50    Types: Cigarettes    Quit date: 06/25/1984    Years since quitting: 36.0  . Smokeless tobacco: Never Used  Substance Use Topics  . Alcohol use: Yes    Comment: 1 glass wine week  . Drug use: No    Home Medications Prior to Admission medications   Medication Sig Start Date End Date Taking? Authorizing Provider  ALPRAZolam Duanne Moron) 0.5 MG tablet Take 0.5 mg by mouth at bedtime as needed for anxiety.    [provider]  ergocalciferol (VITAMIN D2) 1.25 MG (50000 UT) capsule Take 50,000 Units by mouth 2 (two) times a week.    [provider]  ezetimibe (ZETIA) 10 MG tablet Take 10 mg by mouth daily.    [provider]  ferrous sulfate 325 (65 FE) MG EC tablet Take 325 mg by mouth 3 (three) times daily with meals.    [provider]  gabapentin (NEURONTIN) 300 MG capsule TAKE 1 CAPSULE (300 MG TOTAL) BY MOUTH 3  (THREE) TIMES DAILY. Patient taking differently: Take 600 mg by mouth at bedtime.  06/07/14   Wallene Huh, DPM  ondansetron (ZOFRAN) 8 MG tablet Take 8 mg by mouth every 8 (eight) hours as needed for nausea or vomiting.     [provider]  oxybutynin (DITROPAN-XL) 10 MG 24 hr tablet Take 10 mg by mouth at bedtime.    [provider]  oxyCODONE (OXY IR/ROXICODONE) 5 MG immediate release tablet Take 5 mg by mouth in the morning and at bedtime.    [provider]  pantoprazole (PROTONIX) 40 MG tablet Take 40 mg by mouth daily.    [provider]  polyethylene glycol (MIRALAX / GLYCOLAX) packet Take 17 g by mouth daily as needed for  mild constipation.     [provider]  pramipexole (MIRAPEX) 1 MG tablet Take 2 mg by mouth at bedtime.     [provider]  pravastatin (PRAVACHOL) 80 MG tablet Take 80 mg by mouth daily.    [provider]  traZODone (DESYREL) 100 MG tablet Take 100 mg by mouth at bedtime.  06/14/14   [provider]  vortioxetine HBr (TRINTELLIX) 10 MG TABS tablet Take by mouth.    [provider]    Allergies    Compazine [prochlorperazine edisylate] and Phenergan [promethazine]  Review of Systems   Review of Systems Ten systems are reviewed and are negative for acute change except as noted in the HPI  Physical Exam Updated Vital Signs BP 118/62 (BP Location: Right Arm)   Pulse 67   Temp 99 F (37.2 C) (Oral)   Resp 16   Ht 5\' 2"  (1.575 m)   Wt 68.9 kg   SpO2 94%   BMI 27.80 kg/m   Physical Exam Constitutional:      General: She is not in acute distress.    Appearance: Normal appearance. She is well-developed. She is not ill-appearing or diaphoretic.  HENT:     Head: Normocephalic and atraumatic.  Eyes:     General: Vision grossly intact. Gaze aligned appropriately.     Pupils: Pupils are equal, round, and reactive to light.  Neck:     Trachea: Trachea and phonation normal.   Cardiovascular:     Rate and Rhythm: Normal rate and regular rhythm.  Pulmonary:     Effort: Pulmonary effort is normal. No respiratory distress.  Abdominal:     General: There is no distension.     Palpations: Abdomen is soft.     Tenderness: There is no abdominal tenderness. There is no guarding or rebound.  Musculoskeletal:        General: Normal range of motion.     Cervical back: Normal range of motion.  Skin:    General: Skin is warm and dry.  Neurological:     Mental Status: She is alert.     GCS: GCS eye subscore is 4. GCS verbal subscore is 5. GCS motor subscore is 6.     Comments: Speech is clear and goal oriented, follows commands Major Cranial nerves without deficit, no facial droop Moves extremities without ataxia, coordination intact  Psychiatric:        Behavior: Behavior normal.     ED Results / Procedures / Treatments   Labs (all labs ordered are listed, but only abnormal results are displayed) Labs Reviewed  COMPREHENSIVE METABOLIC PANEL - Abnormal; Notable for the following components:      Result Value   Glucose, Bld 104 (*)    Total Protein 6.4 (*)    Total Bilirubin 0.2 (*)    All other components within normal limits  URINALYSIS, ROUTINE W REFLEX MICROSCOPIC - Abnormal; Notable for the following components:   Color, Urine STRAW (*)    Hgb urine dipstick SMALL (*)    All other components within normal limits  URINALYSIS, MICROSCOPIC (REFLEX) - Abnormal; Notable for the following components:   Bacteria, UA RARE (*)    All other components within normal limits  RESP PANEL BY RT-PCR (FLU A&B, COVID) ARPGX2  CULTURE, BLOOD (ROUTINE X 2)  CULTURE, BLOOD (ROUTINE X 2)  CBC WITH DIFFERENTIAL/PLATELET  LIPASE, BLOOD  RAPID URINE DRUG SCREEN, HOSP PERFORMED  LACTIC ACID, PLASMA  LACTIC ACID, PLASMA  CBG MONITORING,  ED  TROPONIN I (HIGH SENSITIVITY)  TROPONIN I (HIGH SENSITIVITY)    EKG EKG Interpretation  Date/Time:  Wednesday July 20 2020  15:28:29 EST Ventricular Rate:  78 PR Interval:    QRS Duration: 86 QT Interval:  386 QTC Calculation: 440 R Axis:   31 Text Interpretation: Sinus rhythm Low voltage, precordial leads Baseline wander in lead(s) V1 V2 V3 No significant change since prior 11/21 Confirmed by Aletta Edouard [phone]) on 07/20/2020 3:35:52 PM   Radiology CT Head Wo Contrast  Result Date: 07/20/2020 CLINICAL DATA:  Altered mental status EXAM: CT HEAD WITHOUT CONTRAST TECHNIQUE: Contiguous axial images were obtained from the base of the skull through the vertex without intravenous contrast. COMPARISON:  08/29/2018 FINDINGS: Brain: No evidence of acute infarction, hemorrhage, hydrocephalus, extra-axial collection or mass lesion/mass effect. Mild atrophic and chronic white matter ischemic changes are noted. Stable chronic calcifications in the right frontal lobe are noted. Vascular: No hyperdense vessel or unexpected calcification. Skull: Normal. Negative for fracture or focal lesion. Sinuses/Orbits: No acute finding. Other: None. IMPRESSION: Mild atrophic and chronic ischemic changes without acute abnormality. Electronically Signed   By: Inez Catalina M.D.   On: 07/20/2020 17:13   DG Chest Portable 1 View  Result Date: 07/20/2020 CLINICAL DATA:  Cough and weakness EXAM: PORTABLE CHEST 1 VIEW COMPARISON:  04/06/2020 FINDINGS: Chronic elevation of the right hemidiaphragm with chronic volume loss at the right lung base. Heart and mediastinal shadows are normal. The lungs are otherwise clear. No heart failure or effusion. Previous shoulder replacement on the right. IMPRESSION: No active disease. Chronic elevation of the right hemidiaphragm with chronic volume loss at the right lung base. Electronically Signed   By: Nelson Chimes M.D.   On: 07/20/2020 15:16    Procedures Procedures   Medications Ordered in ED Medications  naloxone [facility]) injection 0.4 mg (0.4 mg Intravenous Given 07/20/20 1627)  lactated ringers bolus 1,000 mL  (1,000 mLs Intravenous New Bag/Given 07/20/20 1723)    ED Course  I have reviewed the triage vital signs and the nursing notes.  Pertinent labs & imaging results that were available during my care of the patient were reviewed by me and considered in my medical decision making (see chart for details).  Clinical Course as of 07/20/20 1957  Wed Jul 21, 1847  2762 75 year old female with nonspecific complaints for for 5 days and nausea.  Lab work unrevealing.  Now more somnolent in the department.  She admits to taking some gabapentin.  Narcan without any significant change.  Getting head CT and some more labs.  Continue to observe. [MB]    Clinical Course User Index [MB] Hayden Rasmussen, MD   MDM Rules/Calculators/A&P                         Additional history obtained from: 1. Nursing notes from this visit. 2. Review of electronic medical records. -------- 75 year old female history as above presented for URI symptoms x5 days, had nausea, generalized weakness and panic attack this morning which prompted her ER visit.  On exam she is well-appearing no acute distress cranial nerves are intact, no meningeal signs, airway clear.  Cardiopulmonary exam is unremarkable.  Abdomen soft nontender and has no peritoneal signs.  Will obtain basic labs including CBC, CMP, lipase, urinalysis, Covid/influenza panel, troponin.  Additionally will get EKG and chest x-ray. - I ordered, reviewed and interpreted labs which include: CBC within normal limits, no leukocytosis to suggest bacterial  process, no anemia. CMP shows no emergent electrolyte derangement, AKI, LFT elevations or gap. Urinalysis shows small, otherwise within normal limits, no evidence for UTI. Lipase within normal limits, doubt pancreatitis. Covid/influenza panel negative. High-sensitivity troponin within normal limits, reassuring.  CXR:  IMPRESSION:  No active disease. Chronic elevation of the right hemidiaphragm with  chronic volume  loss at the right lung base.   EKG:  Sinus rhythm Low voltage, precordial leads Baseline wander in lead(s) V1 V2 V3 No significant change since prior 11/21 Confirmed by Aletta Edouard [phone]) on 07/20/2020 3:35:52 PM ================== 4:31 PM: Rechecked patient to update her on findings above.  She is somnolent, responsive to painful stimuli bilaterally.  Moves bilateral extremities to pain.  She will briefly awaken, she states her name and reports she is feeling well has no pain.  Reports she took 2 gabapentin unknown dose speech clear.  She quickly falls back to sleep.  Pupils equal and pinpoint.  Attempted Narcan 0.4 mg without significant improvement. Will obtain CT head, UDS, lactic and continue to monitor.  CBG recheck is 87.  Vital signs stable on room air. Patient seen by Dr. Melina Copa who agrees with plan. -------------------------------- CT Head:  IMPRESSION:  Mild atrophic and chronic ischemic changes without acute  abnormality.   UDS negative. Lactic acid within normal limits ----------- 7 PM: Patient has been reassessed multiple times each time she is subsequently more alert.  At this time she is fully alert and oriented reports that she is feeling well.  She reports that she took her gabapentin just before coming to the ER and she reports she normally becomes very drowsy after she takes it and this difficulty waking up and somnolence is a daily occurrence for her after taking her gabapentin.  I discussed with patient that she may want to stop taking her gabapentin and follow-up with her PCP for medication change.  Vital signs are stable, neuro exam within normal limits.  Will continue to observe, suspect patient somnolence earlier secondary to medication, low suspicion for CVA/TIA or other acute CNS pathology. - Patient has been ambulated on the emergency department eating and drinking without difficulty.  Suspect patient's somnolence earlier was secondary to her gabapentin.  I advised  patient start taking this medication call her primary care doctor first thing tomorrow morning to discuss medication side effects and medication changes.  Discussed plan of care with attending physician Dr. Melina Copa who is seen and evaluated patient during this visit and agrees with discharge and PCP follow-up.  As to patient's presenting complaint today.  She has mild URI, Covid flu test are negative.  I advised patient drink plenty water to avoid dehydration get plenty of rest and follow-up with her PCP this week for recheck.  No indication for antibiotics or further work-up at this time  At this time there does not appear to be any evidence of an acute emergency medical condition and the patient appears stable for discharge with appropriate outpatient follow up. Diagnosis was discussed with patient who verbalizes understanding of care plan and is agreeable to discharge. I have discussed return precautions with patient who verbalizes understanding. Patient encouraged to follow-up with their PCP. All questions answered.   Note: Portions of this report may have been transcribed using voice recognition software. Every effort was made to ensure accuracy; however, inadvertent computerized transcription errors may still be present. Final Clinical Impression(s) / ED Diagnoses Final diagnoses:  Viral upper respiratory tract infection    Rx / DC Orders  ED Discharge Orders    None       Gari Crown 07/20/20 1957    Hayden Rasmussen, MD 07/21/20 1040

## 2020-07-20 NOTE — Discharge Instructions (Signed)
At this time there does not appear to be the presence of an emergent medical condition, however there is always the potential for conditions to change. Please read and follow the below instructions.  Please return to the Emergency Department immediately for any new or worsening symptoms. You seem to be experiencing a viral upper respiratory tract infection.  Please drink plenty of water to avoid dehydration and get plenty of rest.  Please see your primary care doctor within the next week for reassessment. While you are in the ER you appeared very tired after you reported you took your gabapentin for today.  As you reported you often get very tired and must sleep after taking gabapentin I am concerned that this may be a reaction to your medicine.  I advise that you call your primary care provider's office tomorrow to speak with them about a medication change to avoid further side effects.  I advised that you stop taking gabapentin until you call your primary care doctor and are cleared to take it again.  Go to the nearest Emergency Department immediately if: You have fever or chills You have shortness of breath that gets worse. You have very bad or constant: Headache. Ear pain. Pain in your forehead, behind your eyes, and over your cheekbones (sinus pain). Chest pain. You have long-lasting (chronic) lung disease along with any of these: Wheezing. Long-lasting cough. Coughing up blood. A change in your usual mucus. You have a stiff neck. You have changes in your: Vision. Hearing. Thinking. Mood. You have any new/concerning or worsening of symptoms   Please read the additional information packets attached to your discharge summary.  Do not take your medicine if  develop an itchy rash, swelling in your mouth or lips, or difficulty breathing; call 911 and seek immediate emergency medical attention if this occurs.  You may review your lab tests and imaging results in their entirety on your  MyChart account.  Please discuss all results of fully with your primary care provider and other specialist at your follow-up visit.  Note: Portions of this text may have been transcribed using voice recognition software. Every effort was made to ensure accuracy; however, inadvertent computerized transcription errors may still be present.

## 2020-07-20 NOTE — ED Notes (Signed)
Patient woke up, able to answer all questions, walked to bathroom without difficulty. Provider aware, ok to discharge at this time.

## 2020-07-20 NOTE — ED Notes (Signed)
Went in room to discharge pt. Pt asleep, awakened by voice but then immediately falls back asleep. States she drove here and is driving home and nobody can pick her up. Pt does not stay awake long enough to review discharge paperwork

## 2020-07-25 LAB — CULTURE, BLOOD (ROUTINE X 2)
Culture: NO GROWTH
Culture: NO GROWTH
Special Requests: ADEQUATE

## 2020-08-11 DIAGNOSIS — M961 Postlaminectomy syndrome, not elsewhere classified: Secondary | ICD-10-CM | POA: Diagnosis not present

## 2020-08-11 DIAGNOSIS — M5416 Radiculopathy, lumbar region: Secondary | ICD-10-CM | POA: Diagnosis not present

## 2020-08-11 DIAGNOSIS — M48061 Spinal stenosis, lumbar region without neurogenic claudication: Secondary | ICD-10-CM | POA: Diagnosis not present

## 2020-08-31 DIAGNOSIS — G8929 Other chronic pain: Secondary | ICD-10-CM | POA: Diagnosis not present

## 2020-08-31 DIAGNOSIS — M545 Low back pain, unspecified: Secondary | ICD-10-CM | POA: Diagnosis not present

## 2020-09-02 DIAGNOSIS — F419 Anxiety disorder, unspecified: Secondary | ICD-10-CM | POA: Diagnosis not present

## 2020-09-02 DIAGNOSIS — Z9119 Patient's noncompliance with other medical treatment and regimen: Secondary | ICD-10-CM | POA: Diagnosis not present

## 2020-09-02 DIAGNOSIS — R69 Illness, unspecified: Secondary | ICD-10-CM | POA: Diagnosis not present

## 2020-09-02 DIAGNOSIS — Z9114 Patient's other noncompliance with medication regimen: Secondary | ICD-10-CM | POA: Diagnosis not present

## 2020-09-02 DIAGNOSIS — G4733 Obstructive sleep apnea (adult) (pediatric): Secondary | ICD-10-CM | POA: Diagnosis not present

## 2020-09-03 ENCOUNTER — Emergency Department (HOSPITAL_COMMUNITY)
Admission: EM | Admit: 2020-09-03 | Discharge: 2020-09-07 | Payer: Medicare HMO | Attending: Emergency Medicine | Admitting: Emergency Medicine

## 2020-09-03 DIAGNOSIS — F32A Depression, unspecified: Secondary | ICD-10-CM | POA: Diagnosis present

## 2020-09-03 DIAGNOSIS — R4589 Other symptoms and signs involving emotional state: Secondary | ICD-10-CM

## 2020-09-03 DIAGNOSIS — R45851 Suicidal ideations: Secondary | ICD-10-CM | POA: Insufficient documentation

## 2020-09-03 DIAGNOSIS — R69 Illness, unspecified: Secondary | ICD-10-CM | POA: Diagnosis not present

## 2020-09-03 DIAGNOSIS — F332 Major depressive disorder, recurrent severe without psychotic features: Secondary | ICD-10-CM | POA: Diagnosis not present

## 2020-09-03 DIAGNOSIS — Z743 Need for continuous supervision: Secondary | ICD-10-CM | POA: Diagnosis not present

## 2020-09-03 DIAGNOSIS — Z9114 Patient's other noncompliance with medication regimen: Secondary | ICD-10-CM | POA: Diagnosis not present

## 2020-09-03 DIAGNOSIS — J9811 Atelectasis: Secondary | ICD-10-CM | POA: Diagnosis not present

## 2020-09-03 DIAGNOSIS — Z87891 Personal history of nicotine dependence: Secondary | ICD-10-CM | POA: Insufficient documentation

## 2020-09-03 DIAGNOSIS — R9431 Abnormal electrocardiogram [ECG] [EKG]: Secondary | ICD-10-CM | POA: Diagnosis not present

## 2020-09-03 DIAGNOSIS — Z20822 Contact with and (suspected) exposure to covid-19: Secondary | ICD-10-CM | POA: Insufficient documentation

## 2020-09-03 DIAGNOSIS — Z96653 Presence of artificial knee joint, bilateral: Secondary | ICD-10-CM | POA: Insufficient documentation

## 2020-09-03 DIAGNOSIS — F419 Anxiety disorder, unspecified: Secondary | ICD-10-CM

## 2020-09-03 DIAGNOSIS — R0902 Hypoxemia: Secondary | ICD-10-CM | POA: Diagnosis not present

## 2020-09-03 DIAGNOSIS — G4733 Obstructive sleep apnea (adult) (pediatric): Secondary | ICD-10-CM | POA: Diagnosis not present

## 2020-09-03 LAB — CBC
HCT: 43.3 % (ref 36.0–46.0)
Hemoglobin: 14.2 g/dL (ref 12.0–15.0)
MCH: 29.2 pg (ref 26.0–34.0)
MCHC: 32.8 g/dL (ref 30.0–36.0)
MCV: 89.1 fL (ref 80.0–100.0)
Platelets: 306 10*3/uL (ref 150–400)
RBC: 4.86 MIL/uL (ref 3.87–5.11)
RDW: 13.5 % (ref 11.5–15.5)
WBC: 7.5 10*3/uL (ref 4.0–10.5)
nRBC: 0 % (ref 0.0–0.2)

## 2020-09-03 LAB — COMPREHENSIVE METABOLIC PANEL
ALT: 21 U/L (ref 0–44)
AST: 35 U/L (ref 15–41)
Albumin: 3.9 g/dL (ref 3.5–5.0)
Alkaline Phosphatase: 78 U/L (ref 38–126)
Anion gap: 8 (ref 5–15)
BUN: 7 mg/dL — ABNORMAL LOW (ref 8–23)
CO2: 27 mmol/L (ref 22–32)
Calcium: 9.7 mg/dL (ref 8.9–10.3)
Chloride: 105 mmol/L (ref 98–111)
Creatinine, Ser: 0.77 mg/dL (ref 0.44–1.00)
GFR, Estimated: >60 mL/min (ref >60)
Glucose, Bld: 111 mg/dL — ABNORMAL HIGH (ref 70–99)
Potassium: 4.3 mmol/L (ref 3.5–5.1)
Sodium: 140 mmol/L (ref 135–145)
Total Bilirubin: 0.8 mg/dL (ref 0.3–1.2)
Total Protein: 6.9 g/dL (ref 6.5–8.1)

## 2020-09-03 LAB — SALICYLATE LEVEL: Salicylate Lvl: <7 mg/dL — ABNORMAL LOW (ref 7.0–30.0)

## 2020-09-03 LAB — RESP PANEL BY RT-PCR (FLU A&B, COVID) ARPGX2
Influenza A by PCR: NEGATIVE
Influenza B by PCR: NEGATIVE
SARS Coronavirus 2 by RT PCR: NEGATIVE

## 2020-09-03 LAB — ACETAMINOPHEN LEVEL: Acetaminophen (Tylenol), Serum: <10 ug/mL — ABNORMAL LOW (ref 10–30)

## 2020-09-03 LAB — ETHANOL: Alcohol, Ethyl (B): <10 mg/dL (ref <10)

## 2020-09-03 MED ORDER — LORAZEPAM 1 MG PO TABS
1.0000 mg | ORAL_TABLET | Freq: Once | ORAL | Status: AC
  Administered 2020-09-03: 1 mg via ORAL
  Filled 2020-09-03: qty 1

## 2020-09-03 NOTE — ED Triage Notes (Signed)
Pt to triage via [facility] from home.  C/o suicidal ideation today and was planning to overdose on pills.  Reports panic attacks x 3 days.  Seen at [facility] yesterday.  Also reports chronic lower back pain.

## 2020-09-03 NOTE — ED Provider Notes (Signed)
[facility] EMERGENCY DEPARTMENT Provider Note   CSN: [id number] Arrival date & time: 09/03/20  1544     History Chief Complaint  Patient presents with  . Suicidal    Carrie Keller is a 75 y.o. female.  Patient presents with thoughts of self-harm.  She has a plan to overdose on pills but has not acted on this plan yet.  She states she has multiple stressors in her life, is estranged from her daughter and she states that she just "cannot take it anymore".  Otherwise denies any fevers vomiting cough or diarrhea.  She went to an outside hospital for assistance yesterday was given Xanax and states that is not helping her sense of anxiety and depression.        Past Medical History:  Diagnosis Date  . Anxiety    sees Dr Jerl Santos  psychiatrist every 3 month  . Arthritis   . Complication of anesthesia   . Depression   . DVT (deep venous thrombosis) ([place]) 2008   RIGHT /POST OP  . GERD (gastroesophageal reflux disease)   . H/O hiatal hernia   . Heart murmur 1970   MVP/ asympotmatic  . Hyperlipidemia   . 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 Neuropathy    BOTH FEET AND LEGS-WORSE ON LEFT SIDE  . Pain    LOWER BACK WITH PROLONGED BENDING OVER AND STANDING- S/P SPINAL FUSION JAN 2015.  Marland Kitchen Peripheral vascular disease ([place])    venous insufficiency/ bilateral legs  . Plantar fasciitis    LEFT  . PONV (postoperative nausea and vomiting)    NO PROBLEMS WITH N&V LAST COUPLE OF SURGERIES  . Pulmonary embolism ([place]) 2008  . Restless leg syndrome   . Scoliosis   . Seizures ([place])    last seizure 1967- states reaction to COMPAZINE  . Shortness of breath    WITH EXERTION    Patient Active Problem List   Diagnosis Date Noted  . Severe obstructive sleep apnea-hypopnea syndrome 05/05/2019  . Adolescent idiopathic scoliosis of thoracolumbar region 03/30/2019  . Chronic pulmonary embolism without acute cor pulmonale ([place]) 03/30/2019   . Sleep-related hypoxia 03/30/2019  . Snoring 03/30/2019  . Rotator cuff tear arthropathy of right shoulder 11/27/2018  . Restless leg syndrome 09/08/2018  . S/P lumbar fusion 07/21/2014  . 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 S/P lumbar spinal fusion 06/03/2013  . Postoperative anemia due to acute blood loss 07/17/2012  . Failed total knee arthroplasty ([place]) 07/02/2012  . Status post repair of paraesophageal diaphragmatic hernia 05/21/2012  . Anxiety 01/23/2012  . Chronic cough 01/23/2012  . Depression 01/23/2012  . Hiatal hernia 01/23/2012  . Gastroesophageal reflux disease with hiatal hernia 01/23/2012  . Hypercholesteremia 01/23/2012    Past Surgical History:  Procedure Laterality Date  . ABDOMINAL HYSTERECTOMY     VAGINAL HYSTERECTOMY WITH BSO  . COLONOSCOPY    . EXTERNAL EAR SURGERY     "release of nerve behind right ear for bells palsy"  . EYE SURGERY     cataracts  . JOINT REPLACEMENT  2003/2008   bilateral knees  . KNEE ARTHROSCOPY     right knee  . KNEE ARTHROSCOPY Right 01/27/2014   Procedure: ARTHROSCOPY RIGHT KNEE WITH SYNOVECTOMY;  Surgeon: Gearlean Alf, MD;  Location: [facility];  Service: Orthopedics;  Laterality: Right;  . MAXIMUM ACCESS (MAS)POSTERIOR LUMBAR INTERBODY FUSION (PLIF) 2 LEVEL N/A 06/03/2013  Procedure: FOR MAXIMUM ACCESS (MAS) POSTERIOR LUMBAR INTERBODY FUSION (PLIF) 2 LEVEL;  Surgeon: Eustace Moore, MD;  Location: [facility];  Service: Neurosurgery;  Laterality: N/A;  FOR MAXIMUM ACCESS (MAS) POSTERIOR LUMBAR INTERBODY FUSION (PLIF) 2 LEVEL  . NISSEN FUNDOPLICATION  61/95  . REVERSE SHOULDER ARTHROPLASTY Right 11/27/2018   Procedure: REVERSE SHOULDER ARTHROPLASTY;  Surgeon: Verner Mould, MD;  Location: [facility];  Service: Orthopedics;  Laterality: Right;  177min  . TOTAL KNEE REVISION Right 07/02/2012   Procedure: TOTAL KNEE REVISION;  Surgeon: Gearlean Alf, MD;  Location: [facility];  Service:  Orthopedics;  Laterality: Right;  . TUBAL LIGATION    . tummy tuck       OB History   No obstetric history on file.     Family History  Problem Relation Age of Onset  . Heart disease Mother   . Prostate cancer Father   . Pancreatic cancer Father   . Hypertension Father   . Diabetes Father   . Alcohol abuse Sister   . Hypertension Brother   . Liver cancer Paternal Uncle   . Colon cancer Paternal Grandmother     Social History   Tobacco Use  . Smoking status: Former Smoker    Packs/day: 0.25    Years: 10.00    Pack years: 2.50    Types: Cigarettes    Quit date: 06/25/1984    Years since quitting: 36.2  . Smokeless tobacco: Never Used  Substance Use Topics  . Alcohol use: Yes    Comment: 1 glass wine week  . Drug use: No    Home Medications Prior to Admission medications   Medication Sig Start Date End Date Taking? Authorizing Provider  ALPRAZolam Duanne Moron) 0.5 MG tablet Take 0.5 mg by mouth at bedtime as needed for anxiety.    [provider]  ergocalciferol (VITAMIN D2) 1.25 MG (50000 UT) capsule Take 50,000 Units by mouth 2 (two) times a week.    [provider]  ezetimibe (ZETIA) 10 MG tablet Take 10 mg by mouth daily.    [provider]  ferrous sulfate 325 (65 FE) MG EC tablet Take 325 mg by mouth 3 (three) times daily with meals.    [provider]  gabapentin (NEURONTIN) 300 MG capsule TAKE 1 CAPSULE (300 MG TOTAL) BY MOUTH 3 (THREE) TIMES DAILY. Patient taking differently: Take 600 mg by mouth at bedtime.  06/07/14   Wallene Huh, DPM  ondansetron (ZOFRAN) 8 MG tablet Take 8 mg by mouth every 8 (eight) hours as needed for nausea or vomiting.     [provider]  oxybutynin (DITROPAN-XL) 10 MG 24 hr tablet Take 10 mg by mouth at bedtime.    [provider]  oxyCODONE (OXY IR/ROXICODONE) 5 MG immediate release tablet Take 5 mg by mouth in the morning and at bedtime.    [provider]  pantoprazole  (PROTONIX) 40 MG tablet Take 40 mg by mouth daily.    [provider]  polyethylene glycol (MIRALAX / GLYCOLAX) packet Take 17 g by mouth daily as needed for mild constipation.     [provider]  pramipexole (MIRAPEX) 1 MG tablet Take 2 mg by mouth at bedtime.     [provider]  pravastatin (PRAVACHOL) 80 MG tablet Take 80 mg by mouth daily.    [provider]  traZODone (DESYREL) 100 MG tablet Take 100 mg by mouth at bedtime.  06/14/14   [provider]  vortioxetine HBr (TRINTELLIX) 10 MG TABS tablet Take by mouth.    [provider]    Allergies    Compazine [prochlorperazine edisylate] and Phenergan [promethazine]  Review of Systems   Review of Systems  Constitutional: Negative for fever.  HENT: Negative for ear pain.   Eyes: Negative for pain.  Respiratory: Negative for cough.   Cardiovascular: Negative for chest pain.  Gastrointestinal: Negative for abdominal pain.  Genitourinary: Negative for flank pain.  Musculoskeletal: Negative for back pain.  Skin: Negative for rash.  Neurological: Negative for headaches.  Psychiatric/Behavioral: Positive for suicidal ideas. The patient is nervous/anxious.     Physical Exam Updated Vital Signs BP (!) 151/87 (BP Location: Left Arm)   Pulse 87   Temp 99.4 F (37.4 C)   Resp 18   SpO2 100%   Physical Exam Constitutional:      General: She is not in acute distress.    Appearance: Normal appearance.  HENT:     Head: Normocephalic.     Nose: Nose normal.  Eyes:     Extraocular Movements: Extraocular movements intact.  Cardiovascular:     Rate and Rhythm: Normal rate.  Pulmonary:     Effort: Pulmonary effort is normal.  Musculoskeletal:        General: Normal range of motion.     Cervical back: Normal range of motion.  Neurological:     General: No focal deficit present.     Mental Status: She is alert. Mental status is at baseline.     ED Results / Procedures /  Treatments   Labs (all labs ordered are listed, but only abnormal results are displayed) Labs Reviewed  COMPREHENSIVE METABOLIC PANEL - Abnormal; Notable for the following components:      Result Value   Glucose, Bld 111 (*)    BUN 7 (*)    All other components within normal limits  SALICYLATE LEVEL - Abnormal; Notable for the following components:   Salicylate Lvl <2.7 (*)    All other components within normal limits  ACETAMINOPHEN LEVEL - Abnormal; Notable for the following components:   Acetaminophen (Tylenol), Serum <10 (*)    All other components within normal limits  RESP PANEL BY RT-PCR (FLU A&B, COVID) ARPGX2  ETHANOL  CBC  RAPID URINE DRUG SCREEN, HOSP PERFORMED    EKG None  Radiology No results found.  Procedures Procedures   Medications Ordered in ED Medications  LORazepam (ATIVAN) tablet 1 mg (has no administration in time range)    ED Course  I have reviewed the triage vital signs and the nursing notes.  Pertinent labs & imaging results that were available during my care of the patient were reviewed by me and considered in my medical decision making (see chart for details).    MDM Rules/Calculators/A&P                          Patient seeking help for suicidal thoughts.  We will follow-up with TTS team.   Final Clinical Impression(s) / ED Diagnoses Final diagnoses:  Suicidal ideation    Rx / DC Orders ED Discharge Orders    None       Luna Fuse, MD 09/03/20 1827

## 2020-09-03 NOTE — ED Notes (Signed)
Pt's belongings placed  in locker #12 (2 bags)

## 2020-09-03 NOTE — ED Notes (Signed)
Family, Dr Cecilio Asper pt's son in law, requests pt placement at [facility] in [place].

## 2020-09-04 ENCOUNTER — Other Ambulatory Visit: Payer: Self-pay

## 2020-09-04 LAB — RAPID URINE DRUG SCREEN, HOSP PERFORMED
Amphetamines: NOT DETECTED
Barbiturates: NOT DETECTED
Benzodiazepines: POSITIVE — AB
Cocaine: NOT DETECTED
Opiates: NOT DETECTED
Tetrahydrocannabinol: NOT DETECTED

## 2020-09-04 MED ORDER — HYDROXYZINE HCL 10 MG PO TABS
10.0000 mg | ORAL_TABLET | Freq: Four times a day (QID) | ORAL | Status: DC | PRN
  Administered 2020-09-04 – 2020-09-05 (×4): 10 mg via ORAL
  Filled 2020-09-04 (×4): qty 1

## 2020-09-04 MED ORDER — IBUPROFEN 400 MG PO TABS
400.0000 mg | ORAL_TABLET | Freq: Three times a day (TID) | ORAL | Status: DC | PRN
  Administered 2020-09-04: 400 mg via ORAL
  Filled 2020-09-04: qty 1

## 2020-09-04 MED ORDER — ACETAMINOPHEN 500 MG PO TABS
1000.0000 mg | ORAL_TABLET | Freq: Once | ORAL | Status: AC
  Administered 2020-09-04: 1000 mg via ORAL
  Filled 2020-09-04: qty 2

## 2020-09-04 NOTE — ED Notes (Signed)
Pt requesting PRN hydroxyzine for anxiety. This RN messaged main pharmacy, requesting dose to be sent down to ED

## 2020-09-04 NOTE — ED Notes (Signed)
Regular lunch tray ordered 

## 2020-09-04 NOTE — BH Assessment (Signed)
TTS is attempting to see patient who is now currently in a hallway bed.

## 2020-09-04 NOTE — ED Notes (Signed)
Pt temporarily placed in room 5 for TTS assessment

## 2020-09-04 NOTE — BH Assessment (Signed)
Called cart, RN answered, in another patient's room.  TTS still waiting to assess patient.

## 2020-09-04 NOTE — BH Assessment (Signed)
Comprehensive Clinical Assessment (CCA) Note  09/04/2020 Carrie Keller [id number]  DISPOSITION: Gave clinical report to Margorie John, PA-C who recommended inpatient treatment at a geriatric-psychiatry facility. Appropriate facilities will be contacted for placement. Pt is not appropriate for [place] because Pt is over age 75. Notified Dr. Thamas Jaegers and Darylene Price, RN of recommendation.  The patient demonstrates the following risk factors for suicide: Chronic risk factors for suicide include: psychiatric disorder of major depressive disorder and chronic pain. Acute risk factors for suicide include: family or marital conflict, unemployment, social withdrawal/isolation and loss (financial, interpersonal, professional). Protective factors for this patient include: responsibility to others (children, family) and religious beliefs against suicide. Considering these factors, the overall suicide risk at this point appears to be high. Patient is not appropriate for outpatient follow up.  [place] [facility] from 09/03/2020 in [facility] from 07/20/2020 in [facility] CATEGORY High Risk No Risk      Pt is a 75 year old widowed female who presents unaccompanied to [facility] reporting severe anxiety, depressive symptoms, and suicidal ideation with plan to overdose on medications. Pt reports she has a history of anxiety and depression. She says three days ago she started experiencing extreme anxiety due to multiple stressors. Pt acknowledges symptoms including crying spells, loss of interest in usual pleasures, fatigue, decreased concentration, decreased sleep, decreased appetite and feelings of guilt, worthlessness and hopelessness. She says she feels overwhelmed and isolated. She denies any history of previous suicide attempts. Pt denies any history of intentional self-injurious behaviors. Pt denies current homicidal ideation or  history of violence. Pt denies any history of auditory or visual hallucinations. Pt denies history of alcohol or other substance use.  Pt reports her husband died two and a half years ago. She says she recently moved into an apartment and is alone. She reports chronic back pain, which has been more intense recently, and she has been unable to unpack. She states she is financially stressed. Pt reports her only child, her daughter, has not spoken to Pt in six months and this is very distressing. Pt states she has a supportive group of friends through her church. She says she has supportive family members, including a brother and a sister, but they live in [place]. She denies any history of abuse. She denies legal problems. She denies access to firearms. Pt states she has a small dog and she believes someone has already gone to her residence to care for it.  Pt says she has no outpatient mental health providers. She says she was inpatient at [facility] in November 2021 for a similar episode of anxiety and depression. She denies any other psychiatric hospitalizations.  Pt is dressed in hospital scrubs, alert and oriented x4. Pt speaks in a clear tone, at moderate volume and normal pace. Motor behavior appears normal. Eye contact is good and Pt is tearful. Pt's mood is depressed and anxious, affect is congruent with mood. Thought process is coherent and relevant. There is no indication Pt is currently responding to internal stimuli or experiencing delusional thought content. Pt was pleasant and cooperative throughout assessment. She says she is willing to sign voluntarily into a psychiatric facility.   Chief Complaint:  Chief Complaint  Patient presents with  . Suicidal   Visit Diagnosis: F33.2 Major depressive disorder, Recurrent episode, Severe   CCA Screening, Triage and Referral (STR)  Patient Reported Information How did you hear about  [place]? No data recorded Referral name: No data  recorded Referral phone number: No data recorded  Whom do you see for routine medical problems? No data recorded Practice/Facility Name: No data recorded Practice/Facility Phone Number: No data recorded Name of Contact: No data recorded Contact Number: No data recorded Contact Fax Number: No data recorded Prescriber Name: No data recorded Prescriber Address (if known): No data recorded  What Is the Reason for Your Visit/Call Today? No data recorded How Long Has This Been Causing You Problems? No data recorded What Do You Feel Would Help You the Most Today? No data recorded  Have You Recently Been in Any Inpatient Treatment (Hospital/Detox/Crisis Center/28-Day Program)? No data recorded Name/Location of Program/Hospital:No data recorded How Long Were You There? No data recorded When Were You Discharged? No data recorded  Have You Ever Received Services From [facility] Before? No data recorded Who Do You See at [facility]? No data recorded  Have You Recently Had Any Thoughts About Hurting Yourself? No data recorded Are You Planning to Commit Suicide/Harm Yourself At This time? No data recorded  Have you Recently Had Thoughts About [place]? No data recorded Explanation: No data recorded  Have You Used Any Alcohol or Drugs in the Past 24 Hours? No data recorded How Long Ago Did You Use Drugs or Alcohol? No data recorded What Did You Use and How Much? No data recorded  Do You Currently Have a Therapist/Psychiatrist? No data recorded Name of Therapist/Psychiatrist: No data recorded  Have You Been Recently Discharged From Any Office Practice or Programs? No data recorded Explanation of Discharge From Practice/Program: No data recorded    CCA Screening Triage Referral Assessment Type of Contact: No data recorded Is this Initial or Reassessment? No data recorded Date Telepsych consult ordered in [facility]:  04/06/2020  Time Telepsych consult ordered in [facility]:  No data  recorded  Patient Reported Information Reviewed? No data recorded Patient Left Without Being Seen? No data recorded Reason for Not Completing Assessment: No data recorded  Collateral Involvement: No data recorded  Does Patient Have a [place]? No data recorded Name and Contact of Legal Guardian: No data recorded If Minor and Not Living with Parent(s), Who has Custody? No data recorded Is CPS involved or ever been involved? Never  Is APS involved or ever been involved? Never   Patient Determined To Be At Risk for Harm To Self or Others Based on Review of Patient Reported Information or Presenting Complaint? No data recorded Method: No data recorded Availability of Means: No data recorded Intent: No data recorded Notification Required: No data recorded Additional Information for Danger to Others Potential: No data recorded Additional Comments for Danger to Others Potential: No data recorded Are There Guns or Other Weapons in Your Home? No data recorded Types of Guns/Weapons: No data recorded Are These Weapons Safely Secured?                            No data recorded Who Could Verify You Are Able To Have These Secured: No data recorded Do You Have any Outstanding Charges, Pending Court Dates, Parole/Probation? No data recorded Contacted To Inform of Risk of Harm To Self or Others: No data recorded  Location of Assessment: [facility]   Does Patient Present under Involuntary Commitment? No data recorded IVC Papers Initial File Date: No data recorded  [place] of Residence: No data recorded  Patient Currently Receiving  the Following Services: No data recorded  Determination of Need: No data recorded  Options For Referral: No data recorded    CCA Biopsychosocial Intake/Chief Complaint:  Pt reports experiencing severe anxiety and depressive symptoms for three days. She reports current suicidal ideation with plan to overdose on medications.  Current  Symptoms/Problems: Pt reports severe anxiety, suicidal ideation, crying spells, fatigue, hopelessness, helplessness, worthlessness.   Patient Reported Schizophrenia/Schizoaffective Diagnosis in Past: No   Strengths: Pt reports she is "normally an upbeat, funny person."  Preferences: None identified  Abilities: Pt is able to live independently and participate in outpatient treatment.   Type of Services Patient Feels are Needed: Pt states she does not know.   Initial Clinical Notes/Concerns: None   Mental Health Symptoms Depression:  Change in energy/activity; Difficulty Concentrating; Fatigue; Hopelessness; Increase/decrease in appetite; Sleep (too much or little); Tearfulness; Weight gain/loss; Worthlessness   Duration of Depressive symptoms: Less than two weeks   Mania:  None   Anxiety:   Difficulty concentrating; Fatigue; Restlessness; Sleep; Tension; Worrying   Psychosis:  None   Duration of Psychotic symptoms: No data recorded  Trauma:  None   Obsessions:  None   Compulsions:  None   Inattention:  N/A   Hyperactivity/Impulsivity:  N/A   Oppositional/Defiant Behaviors:  N/A   Emotional Irregularity:  None   Other Mood/Personality Symptoms:  NA    Mental Status Exam Appearance and self-care  Stature:  Average   Weight:  Overweight   Clothing:  Casual   Grooming:  Normal   Cosmetic use:  Age appropriate   Posture/gait:  Normal   Motor activity:  Not Remarkable   Sensorium  Attention:  Normal   Concentration:  Anxiety interferes   Orientation:  X5   Recall/memory:  Normal   Affect and Mood  Affect:  Anxious; Depressed; Tearful   Mood:  Depressed; Anxious   Relating  Eye contact:  Normal   Facial expression:  Anxious; Depressed; Sad   Attitude toward examiner:  Cooperative   Thought and Language  Speech flow: Clear and Coherent   Thought content:  Appropriate to Mood and Circumstances   Preoccupation:  None   Hallucinations:   None   Organization:  No data recorded  [employer] of Knowledge:  Average   Intelligence:  Average   Abstraction:  Normal   Judgement:  Fair   Reality Testing:  Adequate   Insight:  Fair   Decision Making:  Normal   Social Functioning  Social Maturity:  Responsible   Social Judgement:  Normal   Stress  Stressors:  Family conflict; Grief/losses; Housing; Illness; Financial   Coping Ability:  Overwhelmed   Skill Deficits:  None   Supports:  Church; Family     Religion: Religion/Spirituality Are You A Religious Person?: Yes What is Your Religious Affiliation?: Christian How Might This Affect Treatment?: NA  Leisure/Recreation: Leisure / Recreation Do You Have Hobbies?: Yes Leisure and Hobbies: Dancing, reading, music  Exercise/Diet: Exercise/Diet Do You Exercise?: No Have You Gained or Lost A Significant Amount of Weight in the Past Six Months?: Yes-Lost Number of Pounds Lost?: 3 Do You Follow a Special Diet?: No Do You Have Any Trouble Sleeping?: Yes Explanation of Sleeping Difficulties: Frequent waking.   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 by current illness: No What is the longest time patient has a held a job?: 26 years Where was the patient employed at that  time?: [employer] Has patient ever been in the [employer]?: No  Education: Education Is Patient Currently Attending School?: No Last Grade Completed: 14 Did Teacher, adult education From [employer]?: Yes Did Physicist, medical?: Yes What Type of College Degree Do you Have?: 2 years Did [place]?: No Did You Have An Individualized Education Program (IIEP): No Did You Have Any Difficulty At School?: No Patient's Education Has Been Impacted by Current Illness: No   CCA Family/Childhood History Family and Relationship History: Family history Marital  status: Widowed Widowed, when?: 2019 Are you sexually active?: No What is your sexual orientation?: Heterosexual Has your sexual activity been affected by drugs, alcohol, medication, or emotional stress?: NA Does patient have children?: Yes How many children?: 1 How is patient's relationship with their children?: Daughter  Childhood History:  Childhood History By whom was/is the patient raised?: Both parents Additional childhood history information: NA Description of patient's relationship with caregiver when they were a child: Pt reports she was raised in a loving family. Patient's description of current relationship with people who raised him/her: Parents are deceased. How were you disciplined when you got in trouble as a child/adolescent?: Spanking. Does patient have siblings?: Yes Number of Siblings: 2 Description of patient's current relationship with siblings: 1 brother, 1 sister, 1 deceased sister Did patient suffer any verbal/emotional/physical/sexual abuse as a child?: No Did patient suffer from severe childhood neglect?: No Has patient ever been sexually abused/assaulted/raped as an adolescent or adult?: No Was the patient ever a victim of a crime or a disaster?: No Witnessed domestic violence?: No Has patient been affected by domestic violence as an adult?: No  Child/Adolescent Assessment:     CCA Substance Use Alcohol/Drug Use: Alcohol / Drug Use Pain Medications: See MAR Prescriptions: See MAR Over the Counter: See MAR History of alcohol / drug use?: No history of alcohol / drug abuse Longest period of sobriety (when/how long): NA                         ASAM's:  Six Dimensions of Multidimensional Assessment  Dimension 1:  Acute Intoxication and/or Withdrawal Potential:      Dimension 2:  Biomedical Conditions and Complications:      Dimension 3:  Emotional, Behavioral, or Cognitive Conditions and Complications:     Dimension 4:  Readiness to  Change:     Dimension 5:  Relapse, Continued use, or Continued Problem Potential:     Dimension 6:  Recovery/Living Environment:     ASAM Severity Score:    ASAM Recommended Level of Treatment:     Substance use Disorder (SUD)    Recommendations for Services/Supports/Treatments:    DSM5 Diagnoses: Patient Active Problem List   Diagnosis Date Noted  . Severe obstructive sleep apnea-hypopnea syndrome 05/05/2019  . Adolescent idiopathic scoliosis of thoracolumbar region 03/30/2019  . Chronic pulmonary embolism without acute cor pulmonale ([place]) 03/30/2019  . Sleep-related hypoxia 03/30/2019  . Snoring 03/30/2019  . Rotator cuff tear arthropathy of right shoulder 11/27/2018  . Restless leg syndrome 09/08/2018  . S/P lumbar fusion 07/21/2014  . 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 S/P lumbar spinal fusion 06/03/2013  . Postoperative anemia due to acute blood loss 07/17/2012  . Failed total knee arthroplasty ([place]) 07/02/2012  . Status post repair of paraesophageal diaphragmatic hernia 05/21/2012  . Anxiety 01/23/2012  . Chronic cough 01/23/2012  . Depression 01/23/2012  . Hiatal hernia 01/23/2012  .  Gastroesophageal reflux disease with hiatal hernia 01/23/2012  . Hypercholesteremia 01/23/2012    Patient Centered Plan: Patient is on the following Treatment Plan(s):  Anxiety and Depression   Referrals to Alternative Service(s): Referred to Alternative Service(s):   Place:   Date:   Time:    Referred to Alternative Service(s):   Place:   Date:   Time:    Referred to Alternative Service(s):   Place:   Date:   Time:    Referred to Alternative Service(s):   Place:   Date:   Time:     Evelena Peat, [facility]

## 2020-09-05 ENCOUNTER — Encounter (HOSPITAL_COMMUNITY): Payer: Self-pay | Admitting: Registered Nurse

## 2020-09-05 ENCOUNTER — Emergency Department (HOSPITAL_COMMUNITY): Payer: Medicare HMO

## 2020-09-05 DIAGNOSIS — J9811 Atelectasis: Secondary | ICD-10-CM | POA: Diagnosis not present

## 2020-09-05 MED ORDER — HYDROXYZINE PAMOATE 50 MG PO CAPS: 50.0000 mg | ORAL_CAPSULE | Freq: Three times a day (TID) | ORAL | Status: DC | PRN

## 2020-09-05 MED ORDER — VORTIOXETINE HBR 5 MG PO TABS
10.0000 mg | ORAL_TABLET | Freq: Every day | ORAL | Status: DC
  Administered 2020-09-06: 10 mg via ORAL
  Filled 2020-09-05 (×2): qty 2

## 2020-09-05 MED ORDER — GABAPENTIN 300 MG PO CAPS
300.0000 mg | ORAL_CAPSULE | Freq: Three times a day (TID) | ORAL | Status: DC
  Administered 2020-09-05 – 2020-09-07 (×5): 300 mg via ORAL
  Filled 2020-09-05 (×5): qty 1

## 2020-09-05 MED ORDER — TRAZODONE HCL 50 MG PO TABS
100.0000 mg | ORAL_TABLET | Freq: Every day | ORAL | Status: DC
  Administered 2020-09-06 (×2): 100 mg via ORAL
  Filled 2020-09-05 (×2): qty 2

## 2020-09-05 MED ORDER — VORTIOXETINE HBR 5 MG PO TABS: 10.0000 mg | ORAL_TABLET | Freq: Every day | ORAL | Status: DC

## 2020-09-05 MED ORDER — HYDROXYZINE HCL 25 MG PO TABS
25.0000 mg | ORAL_TABLET | Freq: Three times a day (TID) | ORAL | Status: DC | PRN
  Administered 2020-09-05 – 2020-09-06 (×2): 25 mg via ORAL
  Filled 2020-09-05 (×3): qty 1

## 2020-09-05 MED ORDER — VORTIOXETINE HBR 5 MG PO TABS: 5.0000 mg | ORAL_TABLET | Freq: Every day | ORAL | Status: DC

## 2020-09-05 NOTE — Consult Note (Signed)
  Patient recommended for geropsychiatric inpatient treatment. Restarted home psychotropic medications.    . gabapentin  300 mg Oral TID  . traZODone  100 mg Oral QHS  . [START ON 09/06/2020] vortioxetine HBr  10 mg Oral Daily

## 2020-09-05 NOTE — Progress Notes (Signed)
Patient meets inpatient criteria per Margorie John, PA.  Patient does not meet criteria for [facility] bed due to needing a geropsych bed.   CSW called [facility] geropsych unit and was informed that they had beds available.  CSW informed admit coordinator that patient information would be faxed over for bed consideration.  Patient was referred to the following facilities:   Destination  Service Provider Request Status Address Phone Fax  [place] [address], [place] [address] [phone] [place], [place] [address] [phone] [place] [address] [place] [address] [phone] [place], [place] [address] [phone]  [place], [place] [address] [phone] [place] [address], [place] [address] [phone] [place] [address] [place], [place] [address] [phone] [place] [address] [place] [address] [phone] [place] [address] [place] [address] [phone] [facility]  Pending - Request Sent [address] [place] [address] [phone]  [place] [address], [place] [address] [phone]  [place] [facility]  Pending - Request Sent [address] [place] [address]  [phone]  [facility]  Pending - Request Sent [place], [place] [address] [phone] [facility]  Pending - Request Sent [place], [place] [address] [place] [address], [place] [address] [phone]  [place] [address], [place] [address] [phone]    CSW will continue to monitor for disposition.  Riki Altes, MSW, LCSW, LCAS 09/05/2020 10:29 AM

## 2020-09-05 NOTE — Care Management (Signed)
Writer spoke to the RN and she is in the process of getting orders so that the information can be faxed.   Incoming disposition counselor Marlou Porch will follow up.

## 2020-09-05 NOTE — Progress Notes (Signed)
[facility] called back to follow up about referral. They discussed that they need additional documentation to be sent over including urinalysis, EKG, and chest xray.    Carman Auxier, LCSW, [place] Social Worker  [facility]

## 2020-09-06 ENCOUNTER — Encounter (HOSPITAL_COMMUNITY): Payer: Self-pay | Admitting: Registered Nurse

## 2020-09-06 DIAGNOSIS — F419 Anxiety disorder, unspecified: Secondary | ICD-10-CM

## 2020-09-06 DIAGNOSIS — R45851 Suicidal ideations: Secondary | ICD-10-CM

## 2020-09-06 DIAGNOSIS — F332 Major depressive disorder, recurrent severe without psychotic features: Secondary | ICD-10-CM | POA: Diagnosis not present

## 2020-09-06 DIAGNOSIS — R69 Illness, unspecified: Secondary | ICD-10-CM | POA: Diagnosis not present

## 2020-09-06 NOTE — Progress Notes (Signed)
Pt has been tentatively accepted to [facility] has sent additional requested information.  CSW has reached out to providers to determine if geropsych remains the recommendation.  CSW will continue to assess disposition.   Assunta Curtis, MSW, LCSW 09/06/2020 11:02 AM

## 2020-09-06 NOTE — ED Notes (Signed)
Pt talked with step-daughter on the phone at the nurse's station.

## 2020-09-06 NOTE — Consult Note (Addendum)
Telepsych Consultation   Reason for Consult:  Worsening depression and suicidal ideation Referring Physician:  Luna Fuse, MD Location of Patient: [facility] ED Location of Provider: Other: [facility]  Patient Identification: Carrie Keller MRN:  [id number] Principal Diagnosis: MDD (major depressive disorder), recurrent severe, without psychosis ([place]) Diagnosis:  Principal Problem:   MDD (major depressive disorder), recurrent severe, without psychosis ([place]) Active Problems:   Suicidal ideation   Total Time spent with patient: 30 minutes  Subjective:   Carrie Keller is a 75 y.o. female patient admitted to [facility] ED after presenting with complaints of anxiety, depression, and suicidal ideation with plan to overdose on medication.  HPI:  Carrie Keller, 75 y.o., female patient seen via tele health by this provider, consulted with Dr. Hampton Abbot; and chart reviewed on 09/06/20.  On evaluation Carrie Keller reports she is feeling a "So, so."  Continues to endorse suicidal ideation.  Patient states she lives alone.  States estranged from her daughter "Something happened to cause [place] to be estranged" but would not elaborate on what happened.  States she does have support from friends and church members.  At this time patient denies auditory/visual hallucinations, paranoia, and homicidal ideation.  States she slept well last night and is tolerating medication without adverse reaction.   During evaluation Carrie Keller is elevated up in bed in no acute distress.  She is alert, oriented x 4, and calm/cooperative throughout assessment.  Her mood is depressed with flat affect.  She does not appear to be responding to internal/external stimuli or delusional thoughts.  Patient denies homicidal ideation, psychosis, and paranoia.  Patient continues to endorse feeling suicidal and agrees that continuing to seek inpatient psychiatric treatment is best.  Patient answered question appropriately.  Past Psychiatric History:  Major depressive disorder  Risk to Self:  Yes Risk to Others:  No Prior Inpatient Therapy:  Yes Prior Outpatient Therapy:  Yes  Past Medical History:  Past Medical History:  Diagnosis Date  . Anxiety    sees Dr Jerl Santos  psychiatrist every 3 month  . Arthritis   . Complication of anesthesia   . Depression   . DVT (deep venous thrombosis) ([place]) 2008   RIGHT /POST OP  . GERD (gastroesophageal reflux disease)   . H/O hiatal hernia   . Heart murmur 1970   MVP/ asympotmatic  . Hyperlipidemia   . 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 Neuropathy    BOTH FEET AND LEGS-WORSE ON LEFT SIDE  . Pain    LOWER BACK WITH PROLONGED BENDING OVER AND STANDING- S/P SPINAL FUSION JAN 2015.  Marland Kitchen Peripheral vascular disease ([place])    venous insufficiency/ bilateral legs  . Plantar fasciitis    LEFT  . PONV (postoperative nausea and vomiting)    NO PROBLEMS WITH N&V LAST COUPLE OF SURGERIES  . Pulmonary embolism ([place]) 2008  . Restless leg syndrome   . Scoliosis   . Seizures ([place])    last seizure 1967- states reaction to COMPAZINE  . Shortness of breath    WITH EXERTION    Past Surgical History:  Procedure Laterality Date  . ABDOMINAL HYSTERECTOMY     VAGINAL HYSTERECTOMY WITH BSO  . COLONOSCOPY    . EXTERNAL EAR SURGERY     "release of nerve behind right ear for bells palsy"  . EYE SURGERY     cataracts  . JOINT REPLACEMENT  2003/2008  bilateral knees  . KNEE ARTHROSCOPY     right knee  . KNEE ARTHROSCOPY Right 01/27/2014   Procedure: ARTHROSCOPY RIGHT KNEE WITH SYNOVECTOMY;  Surgeon: Gearlean Alf, MD;  Location: [facility];  Service: Orthopedics;  Laterality: Right;  . MAXIMUM ACCESS (MAS)POSTERIOR LUMBAR INTERBODY FUSION (PLIF) 2 LEVEL N/A 06/03/2013   Procedure: FOR MAXIMUM ACCESS (MAS) POSTERIOR LUMBAR INTERBODY FUSION (PLIF) 2 LEVEL;  Surgeon: Eustace Moore, MD;  Location: [place];  Service: Neurosurgery;  Laterality: N/A;  FOR MAXIMUM ACCESS  (MAS) POSTERIOR LUMBAR INTERBODY FUSION (PLIF) 2 LEVEL  . NISSEN FUNDOPLICATION  98/33  . REVERSE SHOULDER ARTHROPLASTY Right 11/27/2018   Procedure: REVERSE SHOULDER ARTHROPLASTY;  Surgeon: Verner Mould, MD;  Location: [place];  Service: Orthopedics;  Laterality: Right;  168min  . TOTAL KNEE REVISION Right 07/02/2012   Procedure: TOTAL KNEE REVISION;  Surgeon: Gearlean Alf, MD;  Location: [facility];  Service: Orthopedics;  Laterality: Right;  . TUBAL LIGATION    . tummy tuck     Family History:  Family History  Problem Relation Age of Onset  . Heart disease Mother   . Prostate cancer Father   . Pancreatic cancer Father   . Hypertension Father   . Diabetes Father   . Alcohol abuse Sister   . Hypertension Brother   . Liver cancer Paternal Uncle   . Colon cancer Paternal Grandmother    Family Psychiatric  History: Sister alcohol use disorder Social History:  Social History   Substance and Sexual Activity  Alcohol Use Yes   Comment: 1 glass wine week     Social History   Substance and Sexual Activity  Drug Use No    Social History   Socioeconomic History  . Marital status: Widowed    Spouse name: Not on file  . Number of children: 1  . Years of education: Not on file  . Highest education level: Not on file  Occupational History  . Occupation: Retired  Tobacco Use  . Smoking status: Former Smoker    Packs/day: 0.25    Years: 10.00    Pack years: 2.50    Types: Cigarettes    Quit date: 06/25/1984    Years since quitting: 36.2  . Smokeless tobacco: Never Used  Substance and Sexual Activity  . Alcohol use: Yes    Comment: 1 glass wine week  . Drug use: No  . Sexual activity: Not Currently  Other Topics Concern  . Not on file  Social History Narrative   Pt lives alone in [place].  Primary care physician is a Dr. Delena Bali in [place].  She also sees someone at a pain management clinic for pain associated with scoliosis   Social Determinants of Health    Financial Resource Strain: Not on file  Food Insecurity: Not on file  Transportation Needs: Not on file  Physical Activity: Not on file  Stress: Not on file  Social Connections: Not on file   Additional Social History:    Allergies:   Allergies  Allergen Reactions  . Compazine [Prochlorperazine Edisylate] Other (See Comments)    Pt reports to having torticollis symptoms and seizure activity with compazine  . Phenergan [Promethazine] Other (See Comments)    Seizures     Labs: No results found for this or any previous visit (from the past 48 hour(s)).  Medications:  Current Facility-Administered Medications  Medication Dose Route Frequency Provider Last Rate Last Admin  . gabapentin (NEURONTIN) capsule 300  mg  300 mg Oral TID Pat Sires B, NP   300 mg at 09/06/20 0952  . hydrOXYzine (ATARAX/VISTARIL) tablet 25 mg  25 mg Oral TID PRN Lafe Clerk B, NP   25 mg at 09/06/20 0952  . ibuprofen (ADVIL) tablet 400 mg  400 mg Oral Q8H PRN Dorie Rank, MD   400 mg at 09/04/20 0954  . traZODone (DESYREL) tablet 100 mg  100 mg Oral QHS Briea Mcenery B, NP   100 mg at 09/06/20 0138  . vortioxetine HBr (TRINTELLIX) tablet 10 mg  10 mg Oral Daily Kathryn Cosby B, NP   10 mg at 09/06/20 [id number]   Current Outpatient Medications  Medication Sig Dispense Refill  . ALPRAZolam (XANAX) 0.25 MG tablet Take 0.25 mg by mouth 3 (three) times daily as needed for anxiety.    . ALPRAZolam (XANAX) 0.5 MG tablet Take 0.5 mg by mouth at bedtime as needed for anxiety.    . ergocalciferol (VITAMIN D2) 1.25 MG (50000 UT) capsule Take 50,000 Units by mouth 2 (two) times a week.    . ezetimibe (ZETIA) 10 MG tablet Take 10 mg by mouth daily.    . ferrous sulfate 325 (65 FE) MG EC tablet Take 325 mg by mouth 3 (three) times daily with meals.    . gabapentin (NEURONTIN) 300 MG capsule TAKE 1 CAPSULE (300 MG TOTAL) BY MOUTH 3 (THREE) TIMES DAILY. (Patient taking differently: Take 600 mg by mouth at bedtime. ) 90  capsule 3  . hydrOXYzine (VISTARIL) 50 MG capsule Take 50 mg by mouth 2 (two) times daily.    . ondansetron (ZOFRAN) 8 MG tablet Take 8 mg by mouth every 8 (eight) hours as needed for nausea or vomiting.     Marland Kitchen oxybutynin (DITROPAN-XL) 10 MG 24 hr tablet Take 10 mg by mouth at bedtime.    Marland Kitchen oxyCODONE (OXY IR/ROXICODONE) 5 MG immediate release tablet Take 5 mg by mouth in the morning and at bedtime.    . pantoprazole (PROTONIX) 40 MG tablet Take 40 mg by mouth daily.    . polyethylene glycol (MIRALAX / GLYCOLAX) packet Take 17 g by mouth daily as needed for mild constipation.     . pramipexole (MIRAPEX) 1 MG tablet Take 2 mg by mouth at bedtime.     . pravastatin (PRAVACHOL) 80 MG tablet Take 80 mg by mouth daily.    . traZODone (DESYREL) 100 MG tablet Take 100 mg by mouth at bedtime.   2  . vortioxetine HBr (TRINTELLIX) 10 MG TABS tablet Take by mouth.      Musculoskeletal: Strength & Muscle Tone: within normal limits Gait & Station: normal Patient leans: N/A  Psychiatric Specialty Exam: Physical Exam Vitals and nursing note reviewed. Chaperone present: Sitter at bedside.  Constitutional:      General: She is not in acute distress.    Appearance: Normal appearance. She is not ill-appearing.  Cardiovascular:     Rate and Rhythm: Normal rate.  Pulmonary:     Effort: Pulmonary effort is normal.  Neurological:     Mental Status: She is alert and oriented to person, place, and time.  Psychiatric:        Attention and Perception: Attention and perception normal. She does not perceive auditory or visual hallucinations.        Mood and Affect: Mood is depressed. Affect is flat.        Speech: Speech normal.        Behavior: Behavior normal. Behavior  is cooperative.        Thought Content: Thought content is not delusional. Thought content includes suicidal ideation. Thought content does not include homicidal ideation. Thought content includes suicidal plan.        Cognition and Memory:  Cognition and memory normal.        Judgment: Judgment normal.     Review of Systems  Musculoskeletal: Positive for arthralgias and myalgias.  Psychiatric/Behavioral: Positive for dysphoric mood and suicidal ideas. Negative for agitation, behavioral problems, confusion and hallucinations. The patient is nervous/anxious.   All other systems reviewed and are negative.   Blood pressure 120/62, pulse 79, temperature 98.4 F (36.9 C), temperature source Oral, resp. rate 18, SpO2 96 %.There is no height or weight on file to calculate BMI.  General Appearance: Casual  Eye Contact:  Good  Speech:  Clear and Coherent and Normal Rate  Volume:  Normal  Mood:  Depressed and Hopeless  Affect:  Depressed and Flat  Thought Process:  Coherent, Goal Directed and Descriptions of Associations: Intact  Orientation:  Full (Time, Place, and Person)  Thought Content:  WDL  Suicidal Thoughts:  Yes.  with intent/plan  Homicidal Thoughts:  No  Memory:  Immediate;   Good Recent;   Good  Judgement:  Fair  Insight:  Fair  Psychomotor Activity:  Normal  Concentration:  Concentration: Good and Attention Span: Good  Recall:  Good  Fund of Knowledge:  Good  Language:  Good  Akathisia:  No  Handed:  Right  AIMS (if indicated):     Assets:  Communication Skills Desire for Improvement Housing Resilience Social Support Transportation  ADL's:  Intact  Cognition:  WNL  Sleep:        Treatment Plan Summary: Daily contact with patient to assess and evaluate symptoms and progress in treatment, Medication management and Plan Inpatient psychiatric treatment  Disposition: Recommend psychiatric Inpatient admission when medically cleared.  This service was provided via telemedicine using a 2-way, interactive audio and video technology.  Names of all persons participating in this telemedicine service and their role in this encounter. Name: Earleen Newport Role: NP  Name: Dr. Hampton Abbot Role: Psychiatrist   Name: Alden Benjamin vic Role: Patient  Name: Dr. Regenia Skeeter and Dr. Lajean Saver Role: [facility] EDP sent a secure message informing:  Psychiatric reassessment completed.  Continue to recommend inpatient psychiatric treatment.  Patient has been accepted to [facility] for inpatient psychiatric treatment and can arrive tomorrow 09/07/20 at 8:00 AM.  See social work note for accepting information.      Jamelah Sitzer, NP 09/06/2020 2:31 PM

## 2020-09-06 NOTE — ED Notes (Signed)
Psychiatric reassessment completed. Continue to recommend inpatient psychiatric treatment. Patient has been accepted to [facility] for inpatient psychiatric treatment and can arrive tomorrow 09/07/20 at 8:00 AM. See social work note for accepting information.

## 2020-09-06 NOTE — Progress Notes (Signed)
CSW received call from [facility].   They had questions on if pt was IVC'ed.  CSW did review of chart and did not observe any IVC paperwork and relayed this.  [place] asked for urinalysis, Covid test within the past 24 hours and additional information.  CSW recommended that she follow up with the patient's nurse and provided the contact information.   Assunta Curtis, MSW, LCSW 09/06/2020 9:26 AM

## 2020-09-06 NOTE — ED Provider Notes (Addendum)
Emergency Medicine Observation Re-evaluation Note  Tiffinie RENLEE FLOOR is a 75 y.o. female, seen on rounds today.  Pt initially presented to the ED for complaints of anxiety, feeling depressed, SI.  Currently pt calm, alert, no new c/o today.   Physical Exam  BP 120/62 (BP Location: Right Arm)   Pulse 79   Temp 98.4 F (36.9 C) (Oral)   Resp 18   SpO2 96%  Physical Exam General: alert, content.  Cardiac: regular rate Lungs: breathing comfortably Psych: content appearing, normal mood.  ED Course / MDM  EKG:   I have reviewed the labs performed to date as well as medications administered while in observation.  Recent changes in the last 24 hours include stabilization on meds, [place] team reassessment and placement efforts.   Plan  Current plan is for [facility] team working on geropsych placement.  [facility] team indicates accepted to [facility] facility, patient to arrive there tomorrow AM:     Patient was accepted to [facility].    Meets inpatient criteria per Earleen Newport, NP.  Attending physician is Dr. Genia Del.    Notified Baker Pierini, RN of acceptance.  Nurses call report to [phone].    Patient can arrive at 09/07/2020 at [place], MSW, LCSW 09/06/2020 11:22 AM          Lajean Saver, MD 09/06/20 [phone]

## 2020-09-06 NOTE — ED Notes (Signed)
They had to change the bed date to tomorrow but pt was accepted to [facility]. Attending physician is Dr. Genia Del. Nurses call report to [phone]. Patient can arrive 09/07/2020 at [place].

## 2020-09-06 NOTE — Progress Notes (Signed)
Patient is being considered at [facility] for an inpatient geropsych bed.  Admit coordinator requested number for [facility] for further follow up, [phone].  SW gave coordinator with patient nurses number so that she can provide ongoing follow up.    Mahi Zabriskie, LCSW, [place] Social Worker  [facility]

## 2020-09-06 NOTE — ED Notes (Signed)
Telepsych in patient's room at this time.

## 2020-09-06 NOTE — ED Notes (Signed)
Pt sitting up eating breakfast tray 

## 2020-09-06 NOTE — Discharge Instructions (Signed)
Transfer to [facility] at 8 AM 4/27.

## 2020-09-06 NOTE — Progress Notes (Signed)
Patient was accepted to [facility] inpatient criteria per Earleen Newport, NP.  Attending physician is Dr. Genia Del.    Notified Baker Pierini, RN of acceptance.  Nurses call report to [phone].    Patient can arrive at 09/07/2020 at [place], MSW, LCSW 09/06/2020 11:22 AM

## 2020-09-06 NOTE — ED Notes (Signed)
Report given to [facility]. Pt resting with eyes closed.

## 2020-09-07 DIAGNOSIS — R45851 Suicidal ideations: Secondary | ICD-10-CM | POA: Diagnosis not present

## 2020-09-07 DIAGNOSIS — F339 Major depressive disorder, recurrent, unspecified: Secondary | ICD-10-CM | POA: Diagnosis not present

## 2020-09-07 DIAGNOSIS — Z87891 Personal history of nicotine dependence: Secondary | ICD-10-CM | POA: Diagnosis not present

## 2020-09-07 DIAGNOSIS — E785 Hyperlipidemia, unspecified: Secondary | ICD-10-CM | POA: Diagnosis not present

## 2020-09-07 DIAGNOSIS — M1990 Unspecified osteoarthritis, unspecified site: Secondary | ICD-10-CM | POA: Diagnosis not present

## 2020-09-07 DIAGNOSIS — G8929 Other chronic pain: Secondary | ICD-10-CM | POA: Diagnosis not present

## 2020-09-07 DIAGNOSIS — K219 Gastro-esophageal reflux disease without esophagitis: Secondary | ICD-10-CM | POA: Diagnosis not present

## 2020-09-07 DIAGNOSIS — Z20822 Contact with and (suspected) exposure to covid-19: Secondary | ICD-10-CM | POA: Diagnosis not present

## 2020-09-07 DIAGNOSIS — R69 Illness, unspecified: Secondary | ICD-10-CM | POA: Diagnosis not present

## 2020-09-07 DIAGNOSIS — F332 Major depressive disorder, recurrent severe without psychotic features: Secondary | ICD-10-CM | POA: Diagnosis not present

## 2020-09-07 DIAGNOSIS — Z96653 Presence of artificial knee joint, bilateral: Secondary | ICD-10-CM | POA: Diagnosis not present

## 2020-09-07 DIAGNOSIS — D649 Anemia, unspecified: Secondary | ICD-10-CM | POA: Diagnosis not present

## 2020-09-07 DIAGNOSIS — G629 Polyneuropathy, unspecified: Secondary | ICD-10-CM | POA: Diagnosis not present

## 2020-09-07 DIAGNOSIS — G4733 Obstructive sleep apnea (adult) (pediatric): Secondary | ICD-10-CM | POA: Diagnosis not present

## 2020-09-07 DIAGNOSIS — F410 Panic disorder [episodic paroxysmal anxiety] without agoraphobia: Secondary | ICD-10-CM | POA: Diagnosis not present

## 2020-09-07 DIAGNOSIS — F32A Depression, unspecified: Secondary | ICD-10-CM | POA: Diagnosis not present

## 2020-09-07 DIAGNOSIS — M549 Dorsalgia, unspecified: Secondary | ICD-10-CM | POA: Diagnosis not present

## 2020-09-07 NOTE — ED Notes (Signed)
Report given to Evette Georges, RN

## 2020-09-22 DIAGNOSIS — M961 Postlaminectomy syndrome, not elsewhere classified: Secondary | ICD-10-CM | POA: Diagnosis not present

## 2020-09-27 DIAGNOSIS — J309 Allergic rhinitis, unspecified: Secondary | ICD-10-CM | POA: Diagnosis not present

## 2020-09-27 DIAGNOSIS — R69 Illness, unspecified: Secondary | ICD-10-CM | POA: Diagnosis not present

## 2020-09-27 DIAGNOSIS — Z09 Encounter for follow-up examination after completed treatment for conditions other than malignant neoplasm: Secondary | ICD-10-CM | POA: Diagnosis not present

## 2020-09-27 DIAGNOSIS — G629 Polyneuropathy, unspecified: Secondary | ICD-10-CM | POA: Diagnosis not present

## 2020-09-27 DIAGNOSIS — Z6828 Body mass index (BMI) 28.0-28.9, adult: Secondary | ICD-10-CM | POA: Diagnosis not present

## 2020-10-28 DIAGNOSIS — E785 Hyperlipidemia, unspecified: Secondary | ICD-10-CM | POA: Diagnosis not present

## 2020-10-28 DIAGNOSIS — E538 Deficiency of other specified B group vitamins: Secondary | ICD-10-CM | POA: Diagnosis not present

## 2020-10-28 DIAGNOSIS — Z6828 Body mass index (BMI) 28.0-28.9, adult: Secondary | ICD-10-CM | POA: Diagnosis not present

## 2020-10-28 DIAGNOSIS — F32A Depression, unspecified: Secondary | ICD-10-CM | POA: Diagnosis not present

## 2020-10-28 DIAGNOSIS — G629 Polyneuropathy, unspecified: Secondary | ICD-10-CM | POA: Diagnosis not present

## 2020-10-28 DIAGNOSIS — D509 Iron deficiency anemia, unspecified: Secondary | ICD-10-CM | POA: Diagnosis not present

## 2020-10-28 DIAGNOSIS — R69 Illness, unspecified: Secondary | ICD-10-CM | POA: Diagnosis not present

## 2020-11-02 DIAGNOSIS — M13812 Other specified arthritis, left shoulder: Secondary | ICD-10-CM | POA: Diagnosis not present

## 2020-11-25 NOTE — Patient Instructions (Addendum)
DUE TO COVID-19 ONLY ONE VISITOR IS ALLOWED TO COME WITH YOU AND STAY IN THE WAITING ROOM ONLY DURING PRE OP AND PROCEDURE DAY OF SURGERY. THE 1 VISITOR  MAY VISIT WITH YOU AFTER SURGERY IN YOUR PRIVATE ROOM DURING VISITING HOURS ONLY!               Carrie Keller   Your procedure is scheduled on: 12/08/20   Report to [facility] Main  Entrance   Report to admitting at : 10:00 AM    Call this number if you have problems the morning of surgery [phone]    Remember: NO SOLID FOOD AFTER MIDNIGHT THE NIGHT PRIOR TO SURGERY. NOTHING BY MOUTH EXCEPT CLEAR LIQUIDS UNTIL: 9:30 AM . PLEASE FINISH ENSURE DRINK PER SURGEON ORDER  WHICH NEEDS TO BE COMPLETED AT : 9:30 AM.   CLEAR LIQUID DIET  Foods Allowed                                                                     Foods Excluded  Coffee and tea, regular and decaf                             liquids that you cannot  Plain Jell-O any favor except red or purple                                           see through such as: Fruit ices (not with fruit pulp)                                     milk, soups, orange juice  Iced Popsicles                                    All solid food Carbonated beverages, regular and diet                                    Cranberry, grape and apple juices Sports drinks like Gatorade Lightly seasoned clear broth or consume(fat free) Sugar, honey syrup  Sample Menu Breakfast                                Lunch                                     Supper Cranberry juice                    Beef broth                            Chicken broth Jell-O  Grape juice                           Apple juice Coffee or tea                        Jell-O                                      Popsicle                                                Coffee or tea                        Coffee or tea  _____________________________________________________________________   BRUSH  YOUR TEETH MORNING OF SURGERY AND RINSE YOUR MOUTH OUT, NO CHEWING GUM CANDY OR MINTS.    Take these medicines the morning of surgery with A SIP OF WATER: hydroxyzine,gabapentin,sertraline,cetirizine,pramipexole,pantoprazole,oxybutynin.                               You may not have any metal on your body including hair pins and              piercings  Do not wear jewelry, make-up, lotions, powders or perfumes, deodorant             Do not wear nail polish on your fingernails.  Do not shave  48 hours prior to surgery.    Do not bring valuables to the hospital. [place].  Contacts, dentures or bridgework may not be worn into surgery.  Leave suitcase in the car. After surgery it may be brought to your room.     Patients discharged the day of surgery will not be allowed to drive home. IF YOU ARE HAVING SURGERY AND GOING HOME THE SAME DAY, YOU MUST HAVE AN ADULT TO DRIVE YOU HOME AND BE WITH YOU FOR 24 HOURS. YOU MAY GO HOME BY TAXI OR UBER OR ORTHERWISE, BUT AN ADULT MUST ACCOMPANY YOU HOME AND STAY WITH YOU FOR 24 HOURS.  Name and phone number of your driver:  Special Instructions: N/A              Please read over the following fact sheets you were given: _____________________________________________________________________           [facility] - Preparing for Surgery Before surgery, you can play an important role.  Because skin is not sterile, your skin needs to be as free of germs as possible.  You can reduce the number of germs on your skin by washing with CHG (chlorahexidine gluconate) soap before surgery.  CHG is an antiseptic cleaner which kills germs and bonds with the skin to continue killing germs even after washing. Please DO NOT use if you have an allergy to CHG or antibacterial soaps.  If your skin becomes reddened/irritated stop using the CHG and inform your nurse when you arrive at Short Stay. Do not shave (including legs and  underarms) for at least 48 hours prior to the  first CHG shower.  You may shave your face/neck. Please follow these instructions carefully:  1.  Shower with CHG Soap the night before surgery and the  morning of Surgery.  2.  If you choose to wash your hair, wash your hair first as usual with your  normal  shampoo.  3.  After you shampoo, rinse your hair and body thoroughly to remove the  shampoo.                           4.  Use CHG as you would any other liquid soap.  You can apply chg directly  to the skin and wash                       Gently with a scrungie or clean washcloth.  5.  Apply the CHG Soap to your body ONLY FROM THE NECK DOWN.   Do not use on face/ open                           Wound or open sores. Avoid contact with eyes, ears mouth and genitals (private parts).                       Wash face,  Genitals (private parts) with your normal soap.             6.  Wash thoroughly, paying special attention to the area where your surgery  will be performed.  7.  Thoroughly rinse your body with warm water from the neck down.  8.  DO NOT shower/wash with your normal soap after using and rinsing off  the CHG Soap.                9.  Pat yourself dry with a clean towel.            10.  Wear clean pajamas.            11.  Place clean sheets on your bed the night of your first shower and do not  sleep with pets. Day of Surgery : Do not apply any lotions/deodorants the morning of surgery.  Please wear clean clothes to the hospital/surgery center.  FAILURE TO FOLLOW THESE INSTRUCTIONS MAY RESULT IN THE CANCELLATION OF YOUR SURGERY PATIENT SIGNATURE_________________________________  NURSE SIGNATURE__________________________________  ________________________________________________________________________  [facility]- Preparing for Total Shoulder Arthroplasty    Before surgery, you can play an important role. Because skin is not sterile, your skin needs to be as free of germs as possible.  You can reduce the number of germs on your skin by using the following products. Benzoyl Peroxide Gel Reduces the number of germs present on the skin Applied twice a day to shoulder area starting two days before surgery    ==================================================================  Please follow these instructions carefully:  BENZOYL PEROXIDE 5% GEL  Please do not use if you have an allergy to benzoyl peroxide.   If your skin becomes reddened/irritated stop using the benzoyl peroxide.  Starting two days before surgery, apply as follows: Apply benzoyl peroxide in the morning and at night. Apply after taking a shower. If you are not taking a shower clean entire shoulder front, back, and side along with the armpit with a clean wet washcloth.  Place a quarter-sized dollop on your shoulder and rub in thoroughly, making sure to cover the front, back,  and side of your shoulder, along with the armpit.   2 days before ____ AM   ____ PM              1 day before ____ AM   ____ PM                         Do this twice a day for two days.  (Last application is the night before surgery, AFTER using the CHG soap as described below).  Do NOT apply benzoyl peroxide gel on the day of surgery.       Incentive Spirometer  An incentive spirometer is a tool that can help keep your lungs clear and active. This tool measures how well you are filling your lungs with each breath. Taking long deep breaths may help reverse or decrease the chance of developing breathing (pulmonary) problems (especially infection) following: A long period of time when you are unable to move or be active. BEFORE THE PROCEDURE  If the spirometer includes an indicator to show your best effort, your nurse or respiratory therapist will set it to a desired goal. If possible, sit up straight or lean slightly forward. Try not to slouch. Hold the incentive spirometer in an upright position. INSTRUCTIONS FOR USE  Sit on the  edge of your bed if possible, or sit up as far as you can in bed or on a chair. Hold the incentive spirometer in an upright position. Breathe out normally. Place the mouthpiece in your mouth and seal your lips tightly around it. Breathe in slowly and as deeply as possible, raising the piston or the ball toward the top of the column. Hold your breath for 3-5 seconds or for as long as possible. Allow the piston or ball to fall to the bottom of the column. Remove the mouthpiece from your mouth and breathe out normally. Rest for a few seconds and repeat Steps 1 through 7 at least 10 times every 1-2 hours when you are awake. Take your time and take a few normal breaths between deep breaths. The spirometer may include an indicator to show your best effort. Use the indicator as a goal to work toward during each repetition. After each set of 10 deep breaths, practice coughing to be sure your lungs are clear. If you have an incision (the cut made at the time of surgery), support your incision when coughing by placing a pillow or rolled up towels firmly against it. Once you are able to get out of bed, walk around indoors and cough well. You may stop using the incentive spirometer when instructed by your caregiver.  RISKS AND COMPLICATIONS Take your time so you do not get dizzy or light-headed. If you are in pain, you may need to take or ask for pain medication before doing incentive spirometry. It is harder to take a deep breath if you are having pain. AFTER USE Rest and breathe slowly and easily. It can be helpful to keep track of a log of your progress. Your caregiver can provide you with a simple table to help with this. If you are using the spirometer at home, follow these instructions: [place] IF:  You are having difficultly using the spirometer. You have trouble using the spirometer as often as instructed. Your pain medication is not giving enough relief while using the spirometer. You  develop fever of 100.5 F (38.1 C) or higher. SEEK IMMEDIATE MEDICAL CARE IF:  You  cough up bloody sputum that had not been present before. You develop fever of 102 F (38.9 C) or greater. You develop worsening pain at or near the incision site. MAKE SURE YOU:  Understand these instructions. Will watch your condition. Will get help right away if you are not doing well or get worse. Document Released: 09/10/2006 Document Revised: 07/23/2011 Document Reviewed: 11/11/2006 [facility] Patient Information 2014 [place], [place].   ________________________________________________________________________

## 2020-11-28 ENCOUNTER — Encounter (HOSPITAL_COMMUNITY): Payer: Self-pay

## 2020-11-28 ENCOUNTER — Other Ambulatory Visit: Payer: Self-pay

## 2020-11-28 ENCOUNTER — Encounter (HOSPITAL_COMMUNITY)
Admission: RE | Admit: 2020-11-28 | Discharge: 2020-11-28 | Disposition: A | Discharge status: Home / Self Care | Payer: Medicare Other | Source: Ambulatory Visit | Attending: Orthopedic Surgery | Admitting: Orthopedic Surgery

## 2020-11-28 DIAGNOSIS — Z01812 Encounter for preprocedural laboratory examination: Secondary | ICD-10-CM | POA: Insufficient documentation

## 2020-11-28 LAB — BASIC METABOLIC PANEL
Anion gap: 8 (ref 5–15)
BUN: 9 mg/dL (ref 8–23)
CO2: 27 mmol/L (ref 22–32)
Calcium: 9.5 mg/dL (ref 8.9–10.3)
Chloride: 104 mmol/L (ref 98–111)
Creatinine, Ser: 0.63 mg/dL (ref 0.44–1.00)
GFR, Estimated: >60 mL/min (ref >60)
Glucose, Bld: 91 mg/dL (ref 70–99)
Potassium: 4.1 mmol/L (ref 3.5–5.1)
Sodium: 139 mmol/L (ref 135–145)

## 2020-11-28 LAB — CBC
HCT: 44.9 % (ref 36.0–46.0)
Hemoglobin: 14 g/dL (ref 12.0–15.0)
MCH: 28.2 pg (ref 26.0–34.0)
MCHC: 31.2 g/dL (ref 30.0–36.0)
MCV: 90.3 fL (ref 80.0–100.0)
Platelets: 263 10*3/uL (ref 150–400)
RBC: 4.97 MIL/uL (ref 3.87–5.11)
RDW: 13.2 % (ref 11.5–15.5)
WBC: 7.5 10*3/uL (ref 4.0–10.5)
nRBC: 0 % (ref 0.0–0.2)

## 2020-11-28 LAB — SURGICAL PCR SCREEN
MRSA, PCR: POSITIVE — AB
Staphylococcus aureus: POSITIVE — AB

## 2020-11-28 NOTE — Progress Notes (Addendum)
COVID Vaccine Completed: Yes Date COVID Vaccine completed:2022 boaster COVID vaccine manufacturer:   [place]     PCP - Ihor Dow: PA Cardiologist - NO  Chest x-ray - 09/05/20 EKG - 09/05/20 EPIC Stress Test -  ECHO -  Cardiac Cath -  Pacemaker/ICD device last checked:  Sleep Study - Yes CPAP - No  Fasting Blood Sugar -  Checks Blood Sugar _____ times a day  Blood Thinner Instructions: Aspirin Instructions: Last Dose:  Anesthesia review: Hx: DVT,PE,Heart murmur,mitral valve prolapse,seizures(last one 50 years ago),OSA(No CPAP)  Patient denies shortness of breath, fever, cough and chest pain at PAT appointment   Patient verbalized understanding of instructions that were given to them at the PAT appointment. Patient was also instructed that they will need to review over the PAT instructions again at home before surgery.

## 2020-12-07 NOTE — Anesthesia Preprocedure Evaluation (Addendum)
Anesthesia Evaluation  Patient identified by MRN, date of birth, ID band Patient awake    Reviewed: Allergy & Precautions, NPO status , Patient's Chart, lab work & pertinent test results  History of Anesthesia Complications (+) PONV  Airway Mallampati: II  TM Distance: >3 FB Neck ROM: Full    Dental no notable dental hx. (+) Teeth Intact, Dental Advisory Given   Pulmonary sleep apnea , former smoker,    Pulmonary exam normal breath sounds clear to auscultation       Cardiovascular + DVT  Normal cardiovascular exam+ Valvular Problems/Murmurs MVP  Rhythm:Regular Rate:Normal     Neuro/Psych    GI/Hepatic hiatal hernia, GERD  ,  Endo/Other    Renal/GU Lab Results      Component                Value               Date                      CREATININE               0.63                11/28/2020                BUN                      9                   11/28/2020                NA                       139                 11/28/2020                K                        4.1                 11/28/2020                CL                       104                 11/28/2020                CO2                      27                  11/28/2020                Musculoskeletal  (+) Arthritis ,   Abdominal   Peds  Hematology Lab Results      Component                Value               Date                      WBC  7.5                 11/28/2020                HGB                      14.0                11/28/2020                HCT                      44.9                11/28/2020                MCV                      90.3                11/28/2020                PLT                      263                 11/28/2020              Anesthesia Other Findings   Reproductive/Obstetrics                            Anesthesia Physical Anesthesia Plan  ASA: 3  Anesthesia  Plan: General   Post-op Pain Management:  Regional for Post-op pain   Induction: Intravenous  PONV Risk Score and Plan: 4 or greater and Treatment may vary due to age or medical condition, Ondansetron, Midazolam and Dexamethasone  Airway Management Planned: Oral ETT  Additional Equipment: None  Intra-op Plan:   Post-operative Plan: Extubation in OR  Informed Consent: I have reviewed the patients History and Physical, chart, labs and discussed the procedure including the risks, benefits and alternatives for the proposed anesthesia with the patient or authorized representative who has indicated his/her understanding and acceptance.     Dental advisory given  Plan Discussed with: CRNA  Anesthesia Plan Comments: (GA w L  ISB exparel)       Anesthesia Quick Evaluation

## 2020-12-08 ENCOUNTER — Other Ambulatory Visit: Payer: Self-pay

## 2020-12-08 ENCOUNTER — Encounter (HOSPITAL_COMMUNITY)
Admission: RE | Disposition: A | Discharge status: Home / Self Care | Payer: Self-pay | Source: Ambulatory Visit | Attending: Orthopedic Surgery

## 2020-12-08 ENCOUNTER — Ambulatory Visit (HOSPITAL_COMMUNITY)
Admission: RE | Admit: 2020-12-08 | Discharge: 2020-12-09 | Disposition: A | Discharge status: Home / Self Care | Payer: Medicare Other | Source: Ambulatory Visit | Attending: Orthopedic Surgery | Admitting: Orthopedic Surgery

## 2020-12-08 ENCOUNTER — Ambulatory Visit (HOSPITAL_COMMUNITY): Payer: Medicare Other | Admitting: Physician Assistant

## 2020-12-08 ENCOUNTER — Ambulatory Visit (HOSPITAL_COMMUNITY): Payer: Medicare Other | Admitting: Anesthesiology

## 2020-12-08 ENCOUNTER — Encounter (HOSPITAL_COMMUNITY): Payer: Self-pay | Admitting: Orthopedic Surgery

## 2020-12-08 DIAGNOSIS — M25812 Other specified joint disorders, left shoulder: Secondary | ICD-10-CM | POA: Diagnosis not present

## 2020-12-08 DIAGNOSIS — Z86718 Personal history of other venous thrombosis and embolism: Secondary | ICD-10-CM | POA: Diagnosis not present

## 2020-12-08 DIAGNOSIS — M19012 Primary osteoarthritis, left shoulder: Secondary | ICD-10-CM | POA: Diagnosis not present

## 2020-12-08 DIAGNOSIS — Z96611 Presence of right artificial shoulder joint: Secondary | ICD-10-CM | POA: Diagnosis not present

## 2020-12-08 DIAGNOSIS — Z96653 Presence of artificial knee joint, bilateral: Secondary | ICD-10-CM | POA: Diagnosis not present

## 2020-12-08 DIAGNOSIS — Z96612 Presence of left artificial shoulder joint: Secondary | ICD-10-CM

## 2020-12-08 DIAGNOSIS — Z87891 Personal history of nicotine dependence: Secondary | ICD-10-CM | POA: Diagnosis not present

## 2020-12-08 DIAGNOSIS — Z79899 Other long term (current) drug therapy: Secondary | ICD-10-CM | POA: Insufficient documentation

## 2020-12-08 DIAGNOSIS — Z86711 Personal history of pulmonary embolism: Secondary | ICD-10-CM | POA: Diagnosis not present

## 2020-12-08 HISTORY — PX: REVERSE SHOULDER ARTHROPLASTY: SHX5054

## 2020-12-08 SURGERY — ARTHROPLASTY, SHOULDER, TOTAL, REVERSE
Anesthesia: General | Site: Shoulder | Laterality: Left

## 2020-12-08 MED ORDER — TRAZODONE HCL 100 MG PO TABS
100.0000 mg | ORAL_TABLET | Freq: Every day | ORAL | Status: DC
  Administered 2020-12-08: 100 mg via ORAL
  Filled 2020-12-08: qty 1

## 2020-12-08 MED ORDER — FENTANYL CITRATE (PF) 100 MCG/2ML IJ SOLN
INTRAMUSCULAR | Status: DC | PRN
  Administered 2020-12-08: 50 ug via INTRAVENOUS

## 2020-12-08 MED ORDER — PROPOFOL 10 MG/ML IV BOLUS
INTRAVENOUS | Status: DC | PRN
  Administered 2020-12-08: 90 mg via INTRAVENOUS

## 2020-12-08 MED ORDER — FENTANYL CITRATE (PF) 100 MCG/2ML IJ SOLN
INTRAMUSCULAR | Status: AC
  Filled 2020-12-08: qty 2

## 2020-12-08 MED ORDER — MIDAZOLAM HCL 2 MG/2ML IJ SOLN
1.0000 mg | INTRAMUSCULAR | Status: DC
Start: 2020-12-08 — End: 2020-12-08
  Administered 2020-12-08: 1 mg via INTRAVENOUS
  Filled 2020-12-08: qty 2

## 2020-12-08 MED ORDER — LACTATED RINGERS IV SOLN: INTRAVENOUS | Status: DC

## 2020-12-08 MED ORDER — SODIUM CHLORIDE 0.9 % IV SOLN
2.0000 g | INTRAVENOUS | Status: AC
  Administered 2020-12-08: 2 g via INTRAVENOUS
  Filled 2020-12-08: qty 2

## 2020-12-08 MED ORDER — SERTRALINE HCL 50 MG PO TABS
50.0000 mg | ORAL_TABLET | Freq: Every day | ORAL | Status: DC
  Administered 2020-12-09: 50 mg via ORAL
  Filled 2020-12-08: qty 1

## 2020-12-08 MED ORDER — TRANEXAMIC ACID 1000 MG/10ML IV SOLN: 1000.0000 mg | INTRAVENOUS | Status: DC

## 2020-12-08 MED ORDER — ONDANSETRON HCL 4 MG/2ML IJ SOLN: 4.0000 mg | Freq: Once | INTRAMUSCULAR | Status: DC | PRN

## 2020-12-08 MED ORDER — GABAPENTIN 300 MG PO CAPS
300.0000 mg | ORAL_CAPSULE | Freq: Three times a day (TID) | ORAL | Status: DC
  Administered 2020-12-08 – 2020-12-09 (×3): 300 mg via ORAL
  Filled 2020-12-08 (×3): qty 1

## 2020-12-08 MED ORDER — APREPITANT 40 MG PO CAPS
40.0000 mg | ORAL_CAPSULE | Freq: Once | ORAL | Status: AC
  Administered 2020-12-08: 40 mg via ORAL
  Filled 2020-12-08: qty 1

## 2020-12-08 MED ORDER — BISACODYL 5 MG PO TBEC: 5.0000 mg | DELAYED_RELEASE_TABLET | Freq: Every day | ORAL | Status: DC | PRN

## 2020-12-08 MED ORDER — FENTANYL CITRATE (PF) 100 MCG/2ML IJ SOLN
25.0000 ug | INTRAMUSCULAR | Status: DC | PRN
  Administered 2020-12-08: 50 ug via INTRAVENOUS
  Administered 2020-12-08: 25 ug via INTRAVENOUS

## 2020-12-08 MED ORDER — DEXAMETHASONE SODIUM PHOSPHATE 10 MG/ML IJ SOLN
INTRAMUSCULAR | Status: AC
  Filled 2020-12-08: qty 1

## 2020-12-08 MED ORDER — ACETAMINOPHEN 10 MG/ML IV SOLN
INTRAVENOUS | Status: AC
  Filled 2020-12-08: qty 100

## 2020-12-08 MED ORDER — METHOCARBAMOL 1000 MG/10ML IJ SOLN
500.0000 mg | Freq: Four times a day (QID) | INTRAVENOUS | Status: DC | PRN
  Filled 2020-12-08: qty 5

## 2020-12-08 MED ORDER — METOCLOPRAMIDE HCL 5 MG PO TABS: 5.0000 mg | ORAL_TABLET | Freq: Three times a day (TID) | ORAL | Status: DC | PRN

## 2020-12-08 MED ORDER — 0.9 % SODIUM CHLORIDE (POUR BTL) OPTIME
TOPICAL | Status: DC | PRN
  Administered 2020-12-08: 1000 mL

## 2020-12-08 MED ORDER — ALUM & MAG HYDROXIDE-SIMETH 200-200-20 MG/5ML PO SUSP: 30.0000 mL | ORAL | Status: DC | PRN

## 2020-12-08 MED ORDER — DEXMEDETOMIDINE (PRECEDEX) IN NS 20 MCG/5ML (4 MCG/ML) IV SYRINGE
PREFILLED_SYRINGE | INTRAVENOUS | Status: DC | PRN
  Administered 2020-12-08: 4 ug via INTRAVENOUS

## 2020-12-08 MED ORDER — STERILE WATER FOR IRRIGATION IR SOLN
Status: DC | PRN
  Administered 2020-12-08 (×2): 1000 mL

## 2020-12-08 MED ORDER — FENTANYL CITRATE (PF) 100 MCG/2ML IJ SOLN
50.0000 ug | INTRAMUSCULAR | Status: DC
Start: 2020-12-08 — End: 2020-12-08
  Administered 2020-12-08: 25 ug via INTRAVENOUS
  Filled 2020-12-08: qty 2

## 2020-12-08 MED ORDER — HYDROXYZINE HCL 25 MG PO TABS
50.0000 mg | ORAL_TABLET | Freq: Two times a day (BID) | ORAL | Status: DC
  Administered 2020-12-08 – 2020-12-09 (×2): 50 mg via ORAL
  Filled 2020-12-08 (×2): qty 2

## 2020-12-08 MED ORDER — SUGAMMADEX SODIUM 200 MG/2ML IV SOLN
INTRAVENOUS | Status: DC | PRN
  Administered 2020-12-08: 150 mg via INTRAVENOUS

## 2020-12-08 MED ORDER — PRAMIPEXOLE DIHYDROCHLORIDE 0.25 MG PO TABS
2.0000 mg | ORAL_TABLET | Freq: Every day | ORAL | Status: DC
  Administered 2020-12-08: 2 mg via ORAL
  Filled 2020-12-08: qty 8

## 2020-12-08 MED ORDER — EPHEDRINE 5 MG/ML INJ
INTRAVENOUS | Status: AC
  Filled 2020-12-08: qty 10

## 2020-12-08 MED ORDER — DIPHENHYDRAMINE HCL 12.5 MG/5ML PO ELIX: 12.5000 mg | ORAL_SOLUTION | ORAL | Status: DC | PRN

## 2020-12-08 MED ORDER — POLYETHYLENE GLYCOL 3350 17 G PO PACK: 17.0000 g | PACK | Freq: Every day | ORAL | Status: DC | PRN

## 2020-12-08 MED ORDER — PRAVASTATIN SODIUM 20 MG PO TABS
80.0000 mg | ORAL_TABLET | Freq: Every day | ORAL | Status: DC
  Administered 2020-12-09: 80 mg via ORAL
  Filled 2020-12-08: qty 4

## 2020-12-08 MED ORDER — ROCURONIUM BROMIDE 10 MG/ML (PF) SYRINGE
PREFILLED_SYRINGE | INTRAVENOUS | Status: AC
  Filled 2020-12-08: qty 10

## 2020-12-08 MED ORDER — CHLORHEXIDINE GLUCONATE 0.12 % MT SOLN
15.0000 mL | Freq: Once | OROMUCOSAL | Status: AC
Start: 2020-12-08 — End: 2020-12-08
  Administered 2020-12-08: 15 mL via OROMUCOSAL

## 2020-12-08 MED ORDER — PANTOPRAZOLE SODIUM 40 MG PO TBEC
40.0000 mg | DELAYED_RELEASE_TABLET | Freq: Every day | ORAL | Status: DC
  Administered 2020-12-09: 40 mg via ORAL
  Filled 2020-12-08: qty 1

## 2020-12-08 MED ORDER — KETOROLAC TROMETHAMINE 15 MG/ML IJ SOLN
7.5000 mg | Freq: Four times a day (QID) | INTRAMUSCULAR | Status: DC
  Administered 2020-12-08 – 2020-12-09 (×3): 7.5 mg via INTRAVENOUS
  Filled 2020-12-08 (×3): qty 1

## 2020-12-08 MED ORDER — METOCLOPRAMIDE HCL 5 MG/ML IJ SOLN: 5.0000 mg | Freq: Three times a day (TID) | INTRAMUSCULAR | Status: DC | PRN

## 2020-12-08 MED ORDER — PHENOL 1.4 % MT LIQD: 1.0000 | OROMUCOSAL | Status: DC | PRN

## 2020-12-08 MED ORDER — BUPIVACAINE LIPOSOME 1.3 % IJ SUSP
INTRAMUSCULAR | Status: DC | PRN
  Administered 2020-12-08: 10 mL via PERINEURAL

## 2020-12-08 MED ORDER — ONDANSETRON HCL 4 MG/2ML IJ SOLN
INTRAMUSCULAR | Status: DC | PRN
  Administered 2020-12-08: 4 mg via INTRAVENOUS

## 2020-12-08 MED ORDER — OXYCODONE HCL 5 MG PO TABS: 5.0000 mg | ORAL_TABLET | ORAL | Status: DC | PRN

## 2020-12-08 MED ORDER — HYDROMORPHONE HCL 1 MG/ML IJ SOLN: 0.5000 mg | INTRAMUSCULAR | Status: DC | PRN

## 2020-12-08 MED ORDER — ORAL CARE MOUTH RINSE: 15.0000 mL | Freq: Once | OROMUCOSAL | Status: AC

## 2020-12-08 MED ORDER — ONDANSETRON HCL 4 MG PO TABS: 4.0000 mg | ORAL_TABLET | Freq: Four times a day (QID) | ORAL | Status: DC | PRN

## 2020-12-08 MED ORDER — DEXAMETHASONE SODIUM PHOSPHATE 10 MG/ML IJ SOLN
INTRAMUSCULAR | Status: DC | PRN
  Administered 2020-12-08: 10 mg via INTRAVENOUS

## 2020-12-08 MED ORDER — DOCUSATE SODIUM 100 MG PO CAPS
100.0000 mg | ORAL_CAPSULE | Freq: Two times a day (BID) | ORAL | Status: DC
  Administered 2020-12-08 – 2020-12-09 (×2): 100 mg via ORAL
  Filled 2020-12-08 (×2): qty 1

## 2020-12-08 MED ORDER — ACETAMINOPHEN 10 MG/ML IV SOLN
1000.0000 mg | Freq: Once | INTRAVENOUS | Status: DC | PRN
  Administered 2020-12-08: 1000 mg via INTRAVENOUS

## 2020-12-08 MED ORDER — ONDANSETRON HCL 4 MG/2ML IJ SOLN
INTRAMUSCULAR | Status: AC
  Filled 2020-12-08: qty 2

## 2020-12-08 MED ORDER — OXYBUTYNIN CHLORIDE ER 5 MG PO TB24
10.0000 mg | ORAL_TABLET | Freq: Every day | ORAL | Status: DC
  Administered 2020-12-08: 10 mg via ORAL
  Filled 2020-12-08: qty 2

## 2020-12-08 MED ORDER — PANTOPRAZOLE SODIUM 40 MG PO TBEC: 40.0000 mg | DELAYED_RELEASE_TABLET | Freq: Every day | ORAL | Status: DC

## 2020-12-08 MED ORDER — ONDANSETRON HCL 4 MG/2ML IJ SOLN: 4.0000 mg | Freq: Four times a day (QID) | INTRAMUSCULAR | Status: DC | PRN

## 2020-12-08 MED ORDER — EZETIMIBE 10 MG PO TABS
10.0000 mg | ORAL_TABLET | Freq: Every day | ORAL | Status: DC
  Administered 2020-12-09: 10 mg via ORAL
  Filled 2020-12-08: qty 1

## 2020-12-08 MED ORDER — ALBUTEROL SULFATE (2.5 MG/3ML) 0.083% IN NEBU: 3.0000 mL | INHALATION_SOLUTION | Freq: Four times a day (QID) | RESPIRATORY_TRACT | Status: DC | PRN

## 2020-12-08 MED ORDER — OXYCODONE HCL 5 MG PO TABS: 10.0000 mg | ORAL_TABLET | ORAL | Status: DC | PRN

## 2020-12-08 MED ORDER — VANCOMYCIN HCL IN DEXTROSE 1-5 GM/200ML-% IV SOLN
1000.0000 mg | INTRAVENOUS | Status: AC
  Administered 2020-12-08: 1000 mg via INTRAVENOUS
  Filled 2020-12-08: qty 200

## 2020-12-08 MED ORDER — LIDOCAINE 2% (20 MG/ML) 5 ML SYRINGE
INTRAMUSCULAR | Status: AC
  Filled 2020-12-08: qty 5

## 2020-12-08 MED ORDER — EPHEDRINE SULFATE-NACL 50-0.9 MG/10ML-% IV SOSY
PREFILLED_SYRINGE | INTRAVENOUS | Status: DC | PRN
  Administered 2020-12-08: 10 mg via INTRAVENOUS
  Administered 2020-12-08 (×3): 20 mg via INTRAVENOUS
  Administered 2020-12-08: 10 mg via INTRAVENOUS

## 2020-12-08 MED ORDER — PHENYLEPHRINE 40 MCG/ML (10ML) SYRINGE FOR IV PUSH (FOR BLOOD PRESSURE SUPPORT)
PREFILLED_SYRINGE | INTRAVENOUS | Status: DC | PRN
  Administered 2020-12-08: 120 ug via INTRAVENOUS

## 2020-12-08 MED ORDER — ROCURONIUM BROMIDE 10 MG/ML (PF) SYRINGE
PREFILLED_SYRINGE | INTRAVENOUS | Status: DC | PRN
  Administered 2020-12-08: 50 mg via INTRAVENOUS

## 2020-12-08 MED ORDER — LIDOCAINE 2% (20 MG/ML) 5 ML SYRINGE
INTRAMUSCULAR | Status: DC | PRN
  Administered 2020-12-08: 20 mg via INTRAVENOUS

## 2020-12-08 MED ORDER — ACETAMINOPHEN 325 MG PO TABS: 325.0000 mg | ORAL_TABLET | Freq: Four times a day (QID) | ORAL | Status: DC | PRN

## 2020-12-08 MED ORDER — METHOCARBAMOL 500 MG PO TABS: 500.0000 mg | ORAL_TABLET | Freq: Four times a day (QID) | ORAL | Status: DC | PRN

## 2020-12-08 MED ORDER — BUPIVACAINE HCL (PF) 0.5 % IJ SOLN
INTRAMUSCULAR | Status: DC | PRN
  Administered 2020-12-08: 14 mL via PERINEURAL

## 2020-12-08 MED ORDER — MENTHOL 3 MG MT LOZG: 1.0000 | LOZENGE | OROMUCOSAL | Status: DC | PRN

## 2020-12-08 MED ORDER — TRANEXAMIC ACID-NACL 1000-0.7 MG/100ML-% IV SOLN
1000.0000 mg | INTRAVENOUS | Status: AC
  Administered 2020-12-08: 1000 mg via INTRAVENOUS
  Filled 2020-12-08: qty 100

## 2020-12-08 MED ORDER — HYDROXYZINE PAMOATE 50 MG PO CAPS: 50.0000 mg | ORAL_CAPSULE | Freq: Two times a day (BID) | ORAL | Status: DC

## 2020-12-08 SURGICAL SUPPLY — 64 items
BAG COUNTER SPONGE SURGICOUNT (BAG) IMPLANT
BAG ZIPLOCK 12X15 (MISCELLANEOUS) ×2 IMPLANT
BLADE SAW SGTL 83.5X18.5 (BLADE) ×2 IMPLANT
COOLER ICEMAN CLASSIC (MISCELLANEOUS) ×2 IMPLANT
COVER BACK TABLE 60X90IN (DRAPES) ×2 IMPLANT
COVER SURGICAL LIGHT HANDLE (MISCELLANEOUS) ×2 IMPLANT
CUP SUT UNIV REVERS 36 NEUTRAL (Cup) ×2 IMPLANT
DERMABOND ADVANCED (GAUZE/BANDAGES/DRESSINGS) ×1
DERMABOND ADVANCED .7 DNX12 (GAUZE/BANDAGES/DRESSINGS) ×1 IMPLANT
DRAPE INCISE IOBAN 66X45 STRL (DRAPES) IMPLANT
DRAPE ORTHO SPLIT 77X108 STRL (DRAPES) ×2
DRAPE SHEET LG 3/4 BI-LAMINATE (DRAPES) ×2 IMPLANT
DRAPE SURG 17X11 SM STRL (DRAPES) ×2 IMPLANT
DRAPE SURG ORHT 6 SPLT 77X108 (DRAPES) ×2 IMPLANT
DRAPE TOP 10253 STERILE (DRAPES) ×2 IMPLANT
DRAPE U-SHAPE 47X51 STRL (DRAPES) ×2 IMPLANT
DRESSING AQUACEL AG SP 3.5X6 (GAUZE/BANDAGES/DRESSINGS) ×1 IMPLANT
DRSG AQUACEL AG ADV 3.5X10 (GAUZE/BANDAGES/DRESSINGS) IMPLANT
DRSG AQUACEL AG SP 3.5X6 (GAUZE/BANDAGES/DRESSINGS) ×2
DURAPREP 26ML APPLICATOR (WOUND CARE) ×2 IMPLANT
ELECT BLADE TIP CTD 4 INCH (ELECTRODE) ×2 IMPLANT
ELECT REM PT RETURN 15FT ADLT (MISCELLANEOUS) ×2 IMPLANT
FACESHIELD WRAPAROUND (MASK) ×8 IMPLANT
GLENOID UNI REV MOD 24 +2 LAT (Joint) ×2 IMPLANT
GLENOSPHERE 36 +4 LAT/24 (Joint) ×2 IMPLANT
GLOVE SRG 8 PF TXTR STRL LF DI (GLOVE) ×1 IMPLANT
GLOVE SURG ENC MOIS LTX SZ7 (GLOVE) ×2 IMPLANT
GLOVE SURG ENC MOIS LTX SZ7.5 (GLOVE) ×2 IMPLANT
GLOVE SURG UNDER POLY LF SZ7 (GLOVE) ×2 IMPLANT
GLOVE SURG UNDER POLY LF SZ8 (GLOVE) ×1
GOWN STRL REUS W/TWL LRG LVL3 (GOWN DISPOSABLE) ×4 IMPLANT
INSERT HUMERAL 36 +6 (Shoulder) ×2 IMPLANT
KIT BASIN OR (CUSTOM PROCEDURE TRAY) ×2 IMPLANT
KIT TURNOVER KIT A (KITS) ×2 IMPLANT
MANIFOLD NEPTUNE II (INSTRUMENTS) ×2 IMPLANT
NEEDLE TAPERED W/ NITINOL LOOP (MISCELLANEOUS) ×2 IMPLANT
NS IRRIG 1000ML POUR BTL (IV SOLUTION) ×2 IMPLANT
PACK SHOULDER (CUSTOM PROCEDURE TRAY) ×2 IMPLANT
PAD ARMBOARD 7.5X6 YLW CONV (MISCELLANEOUS) ×2 IMPLANT
PAD COLD SHLDR WRAP-ON (PAD) ×2 IMPLANT
PIN NITINOL TARGETER 2.8 (PIN) IMPLANT
PIN SET MODULAR GLENOID SYSTEM (PIN) IMPLANT
RESTRAINT HEAD UNIVERSAL NS (MISCELLANEOUS) ×2 IMPLANT
SCREW CENTRAL MODULAR 25 (Screw) ×2 IMPLANT
SCREW PERI LOCK 5.5X16 (Screw) ×2 IMPLANT
SCREW PERI LOCK 5.5X24 (Screw) ×2 IMPLANT
SCREW PERIPHERAL 5.5X20 LOCK (Screw) ×4 IMPLANT
SLING ARM FOAM STRAP LRG (SOFTGOODS) IMPLANT
SLING ARM FOAM STRAP MED (SOFTGOODS) IMPLANT
SPONGE T-LAP 18X18 ~~LOC~~+RFID (SPONGE) IMPLANT
STEM HUMERAL MOD SZ 5 135 DEG (Stem) ×2 IMPLANT
SUCTION FRAZIER HANDLE 12FR (TUBING) ×1
SUCTION TUBE FRAZIER 12FR DISP (TUBING) ×1 IMPLANT
SUT FIBERWIRE #2 38 T-5 BLUE (SUTURE)
SUT MNCRL AB 3-0 PS2 18 (SUTURE) ×2 IMPLANT
SUT MON AB 2-0 CT1 36 (SUTURE) ×2 IMPLANT
SUT VIC AB 1 CT1 36 (SUTURE) ×2 IMPLANT
SUTURE FIBERWR #2 38 T-5 BLUE (SUTURE) IMPLANT
SUTURE TAPE 1.3 40 TPR END (SUTURE) ×2 IMPLANT
SUTURETAPE 1.3 40 TPR END (SUTURE) ×4
TOWEL OR 17X26 10 PK STRL BLUE (TOWEL DISPOSABLE) ×2 IMPLANT
TOWEL OR NON WOVEN STRL DISP B (DISPOSABLE) ×2 IMPLANT
WATER STERILE IRR 1000ML POUR (IV SOLUTION) ×4 IMPLANT
YANKAUER SUCT BULB TIP 10FT TU (MISCELLANEOUS) ×2 IMPLANT

## 2020-12-08 NOTE — Discharge Instructions (Signed)
 Kevin M. Supple, M.D., F.A.A.O.S. Orthopaedic Surgery Specializing in Arthroscopic and Reconstructive Surgery of the Shoulder [phone] [address] - [place], [place] [address] - Fax [phone]   POST-OP TOTAL SHOULDER REPLACEMENT INSTRUCTIONS  1. Follow up in the office for your first post-op appointment 10-14 days from the date of your surgery. If you do not already have a scheduled appointment, our office will contact you to schedule.  2. The bandage over your incision is waterproof. You may begin showering with this dressing on. You may leave this dressing on until first follow up appointment within 2 weeks. We prefer you leave this dressing in place until follow up however after 5-7 days if you are having itching or skin irritation and would like to remove it you may do so. Go slow and tug at the borders gently to break the bond the dressing has with the skin. At this point if there is no drainage it is okay to go without a bandage or you may cover it with a light guaze and tape. You can also expect significant bruising around your shoulder that will drift down your arm and into your chest wall. This is very normal and should resolve over several days.   3. Wear your sling/immobilizer at all times except to perform the exercises below or to occasionally let your arm dangle by your side to stretch your elbow. You also need to sleep in your sling immobilizer until instructed otherwise. It is ok to remove your sling if you are sitting in a controlled environment and allow your arm to rest in a position of comfort by your side or on your lap with pillows to give your neck and skin a break from the sling. You may remove it to allow arm to dangle by side to shower. If you are up walking around and when you go to sleep at night you need to wear it.  4. Range of motion to your elbow, wrist, and hand are encouraged 3-5 times daily. Exercise to your hand and fingers helps to reduce  swelling you may experience.   5. Prescriptions for a pain medication and a muscle relaxant are provided for you. It is recommended that if you are experiencing pain that you pain medication alone is not controlling, add the muscle relaxant along with the pain medication which can give additional pain relief. The first 1-2 days is generally the most severe of your pain and then should gradually decrease. As your pain lessens it is recommended that you decrease your use of the pain medications to an "as needed basis'" only and to always comply with the recommended dosages of the pain medications.  6. Pain medications can produce constipation along with their use. If you experience this, the use of an over the counter stool softener or laxative daily is recommended.   7. For additional questions or concerns, please do not hesitate to call the office. If after hours there is an answering service to forward your concerns to the physician on call.  8.Pain control following an exparel block  To help control your post-operative pain you received a nerve block  performed with Exparel which is a long acting anesthetic (numbing agent) which can provide pain relief and sensations of numbness (and relief of pain) in the operative shoulder and arm for up to 3 days. Sometimes it provides mixed relief, meaning you may still have numbness in certain areas of the arm but can still be able to   move  parts of that arm, hand, and fingers. We recommend that your prescribed pain medications  be used as needed. We do not feel it is necessary to "pre medicate" and "stay ahead" of pain.  Taking narcotic pain medications when you are not having any pain can lead to unnecessary and potentially dangerous side effects.    9. Use the ice machine as much as possible in the first 5-7 days from surgery, then you can wean its use to as needed. The ice typically needs to be replaced every 6 hours, instead of ice you can actually freeze  water bottles to put in the cooler and then fill water around them to avoid having to purchase ice. You can have spare water bottles freezing to allow you to rotate them once they have melted. Try to have a thin shirt or light cloth or towel under the ice wrap to protect your skin.   FOR ADDITIONAL INFO ON ICE MACHINE AND INSTRUCTIONS GO TO THE WEBSITE AT  https://www.djogl[employer].com/products/donjoy/donjoy-iceman-[email or web address]  10.  We recommend that you avoid any dental work or cleaning in the first 3 months following your joint replacement. This is to help minimize the possibility of infection from the bacteria in your mouth that enters your bloodstream during dental work. We also recommend that you take an antibiotic prior to your dental work for the first year after your shoulder replacement to further help reduce that risk. Please simply contact our office for antibiotics to be sent to your pharmacy prior to dental work.  11. Dental Antibiotics:  In most cases prophylactic antibiotics for Dental procdeures after total joint surgery are not necessary.  Exceptions are as follows:  1. History of prior total joint infection  2. Severely immunocompromised (Organ Transplant, cancer chemotherapy, Rheumatoid biologic meds such as Humera)  3. Poorly controlled diabetes (A1C &gt; 8.0, blood glucose over 200)  If you have one of these conditions, contact your surgeon for an antibiotic prescription, prior to your dental procedure.   POST-OP EXERCISES  Pendulum Exercises  Perform pendulum exercises while standing and bending at the waist. Support your uninvolved arm on a table or chair and allow your operated arm to hang freely. Make sure to do these exercises passively - not using you shoulder muscles. These exercises can be performed once your nerve block effects have worn off.  Repeat 20 times. Do 3 sessions per day.     

## 2020-12-08 NOTE — Anesthesia Procedure Notes (Signed)
Procedure Name: Intubation Date/Time: 12/08/2020 12:34 PM Performed by: Niel Hummer, CRNA Pre-anesthesia Checklist: Patient identified, Emergency Drugs available, Suction available and Patient being monitored Patient Re-evaluated:Patient Re-evaluated prior to induction Oxygen Delivery Method: Circle system utilized Preoxygenation: Pre-oxygenation with 100% oxygen Induction Type: IV induction Ventilation: Mask ventilation without difficulty Laryngoscope Size: Miller and 2 Grade View: Grade II Tube type: Oral Tube size: 7.0 mm Number of attempts: 1 Airway Equipment and Method: Stylet and Oral airway Placement Confirmation: ETT inserted through vocal cords under direct vision, positive ETCO2 and breath sounds checked- equal and bilateral Secured at: 22 cm Tube secured with: Tape Dental Injury: Teeth and Oropharynx as per pre-operative assessment

## 2020-12-08 NOTE — Transfer of Care (Signed)
Immediate Anesthesia Transfer of Care Note  Patient: Carrie Keller  Procedure(s) Performed: REVERSE SHOULDER ARTHROPLASTY (Left: Shoulder)  Patient Location: PACU  Anesthesia Type:GA combined with regional for post-op pain  Level of Consciousness: awake, alert , drowsy and patient cooperative  Airway & Oxygen Therapy: Patient Spontanous Breathing and Patient connected to face mask oxygen  Post-op Assessment: Report given to RN and Post -op Vital signs reviewed and stable  Post vital signs: stable  Last Vitals:  Vitals Value Taken Time  BP 114/66 12/08/20 1404  Temp    Pulse 83 12/08/20 1405  Resp 21 12/08/20 1405  SpO2 98 % 12/08/20 1405  Vitals shown include unvalidated device data.  Last Pain:  Vitals:   12/08/20 1212  TempSrc:   PainSc: 0-No pain         Complications: No notable events documented.

## 2020-12-08 NOTE — H&P (Signed)
Carrie Keller    Chief Complaint: Left shoulder osteoarthritis HPI: The patient is a 74 y.o. female with chronic and progressively increasing left shoulder pain related to severe osteoarthritis and associated rotator cuff dysfunction.  Due to her increasing functional rotations and failure to respond to prolonged attempts at conservative management, she is brought to the operating room at this time for planned left shoulder reverse arthroplasty.  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   Peripheral vascular disease ([facility])    venous insufficiency/ bilateral legs   Plantar fasciitis    LEFT   PONV (postoperative nausea and vomiting)    NO PROBLEMS WITH N&V LAST COUPLE OF SURGERIES   Pulmonary embolism ([place]) 2008   Restless leg syndrome    Scoliosis    Seizures ([place])    last seizure 1967- states reaction to COMPAZINE   Shortness of breath    WITH EXERTION    Past Surgical History:  Procedure Laterality Date   ABDOMINAL HYSTERECTOMY     VAGINAL HYSTERECTOMY WITH BSO   COLONOSCOPY     EXTERNAL EAR SURGERY     "release of nerve behind right ear for bells palsy"   EYE SURGERY     cataracts   JOINT REPLACEMENT  2003/2008   bilateral knees   KNEE ARTHROSCOPY     right knee   KNEE ARTHROSCOPY Right 01/27/2014   Procedure: ARTHROSCOPY RIGHT KNEE WITH SYNOVECTOMY;  Surgeon:  Gearlean Alf, MD;  Location: [facility];  Service: Orthopedics;  Laterality: Right;   MAXIMUM ACCESS (MAS)POSTERIOR LUMBAR INTERBODY FUSION (PLIF) 2 LEVEL N/A 06/03/2013   Procedure: FOR MAXIMUM ACCESS (MAS) POSTERIOR LUMBAR INTERBODY FUSION (PLIF) 2 LEVEL;  Surgeon: Eustace Moore, MD;  Location: [facility];  Service: Neurosurgery;  Laterality: N/A;  FOR MAXIMUM ACCESS (MAS) POSTERIOR LUMBAR INTERBODY FUSION (PLIF) 2 LEVEL   NISSEN FUNDOPLICATION  [id number]   REVERSE SHOULDER ARTHROPLASTY Right 11/27/2018   Procedure: REVERSE SHOULDER ARTHROPLASTY;  Surgeon: Verner Mould, MD;  Location: [facility];  Service: Orthopedics;  Laterality: Right;  144mn   TOTAL KNEE REVISION Right 07/02/2012   Procedure: TOTAL KNEE REVISION;  Surgeon: FGearlean Alf MD;  Location: [facility];  Service: Orthopedics;  Laterality: Right;   TUBAL LIGATION     tummy tuck      Family History  Problem Relation Age of Onset   Heart disease Mother    Prostate cancer Father    Pancreatic cancer Father    Hypertension Father    Diabetes Father    Alcohol abuse Sister    Hypertension Brother    Liver cancer Paternal Uncle    Colon cancer Paternal Grandmother     Social History:  reports that  she quit smoking about 36 years ago. Her smoking use included cigarettes. She has a 2.50 pack-year smoking history. She has never used smokeless tobacco. She reports current alcohol use. She reports that she does not use drugs.   Medications Prior to Admission  Medication Sig Dispense Refill   cetirizine (ZYRTEC) 10 MG tablet Take 10 mg by mouth daily.     ezetimibe (ZETIA) 10 MG tablet Take 10 mg by mouth daily.     gabapentin (NEURONTIN) 300 MG capsule TAKE 1 CAPSULE (300 MG TOTAL) BY MOUTH 3 (THREE) TIMES DAILY. (Patient taking differently: Take 300 mg by mouth 3 (three) times daily.) 90 capsule 3   hydrOXYzine (VISTARIL) 50 MG capsule Take 50 mg by mouth 2 (two) times daily.     oxybutynin (DITROPAN-XL) 10 MG 24 hr tablet Take  10 mg by mouth at bedtime.     pantoprazole (PROTONIX) 40 MG tablet Take 40 mg by mouth daily.     polyethylene glycol (MIRALAX / GLYCOLAX) packet Take 17 g by mouth daily as needed for mild constipation.      pramipexole (MIRAPEX) 1 MG tablet Take 2 mg by mouth at bedtime.      pravastatin (PRAVACHOL) 80 MG tablet Take 80 mg by mouth daily.     sertraline (ZOLOFT) 50 MG tablet Take 50 mg by mouth daily.     traZODone (DESYREL) 100 MG tablet Take 100 mg by mouth at bedtime.  2   albuterol (VENTOLIN HFA) 108 (90 Base) MCG/ACT inhaler Inhale 2 puffs into the lungs every 6 (six) hours as needed for wheezing or shortness of breath.       Physical Exam: Left shoulder demonstrates painful and guarded motion as noted at her recent office visit.  She has globally decreased strength to manual muscle testing.  Otherwise neurovascular intact.  Radiographs  Previous plain films of the left shoulder reveal advanced arthritis.    Vitals  Temp:  [98.1 F (36.7 C)] 98.1 F (36.7 C) (07/28 1028) Pulse Rate:  [74] 74 (07/28 1028) Resp:  [21] 21 (07/28 1028) BP: (123)/(76) 123/76 (07/28 1028) SpO2:  [93 %] 93 % (07/28 1028)  Assessment/Plan  Impression: Left shoulder osteoarthritis  Plan of Action: Procedure(s): REVERSE SHOULDER ARTHROPLASTY  Hira Trent M Susie Ehresman 12/08/2020, 11:26 AM Contact # [phone]

## 2020-12-08 NOTE — Anesthesia Procedure Notes (Signed)
Anesthesia Regional Block: Interscalene brachial plexus block   Pre-Anesthetic Checklist: , timeout performed,  Correct Patient, Correct Site, Correct Laterality,  Correct Procedure, Correct Position, site marked,  Risks and benefits discussed,  Surgical consent,  Pre-op evaluation,  At surgeon's request and post-op pain management  Laterality: Upper and Left  Prep: Maximum Sterile Barrier Precautions used, chloraprep       Needles:  Injection technique: Single-shot  Needle Type: Echogenic Needle     Needle Length: 5cm  Needle Gauge: 21     Additional Needles:   Procedures:,,,, ultrasound used (permanent image in chart),,    Narrative:  Start time: 12/08/2020 12:01 PM End time: 12/08/2020 12:09 PM Injection made incrementally with aspirations every 5 mL.  Performed by: Personally  Anesthesiologist: Barnet Glasgow, MD  Additional Notes: Block assessed prior to procedure. Patient tolerated procedure well.

## 2020-12-08 NOTE — Op Note (Signed)
12/08/2020  1:38 PM  PATIENT:   Carrie Keller  75 y.o. female  PRE-OPERATIVE DIAGNOSIS:  Left shoulder osteoarthritis with associated severe rotator cuff dysfunction  POST-OPERATIVE DIAGNOSIS: Same  PROCEDURE: Left shoulder reverse arthroplasty utilizing a press-fit size 5.5 Arthrex stem with a neutral metaphysis, +6 polyethylene insert, 36/+4 glenosphere on a small/+2 baseplate  SURGEON:  Ade Stmarie, Metta Clines M.D.  ASSISTANTS: Jenetta Loges, PA-C  ANESTHESIA:   General endotracheal and interscalene block with Exparel  EBL: 100 cc  SPECIMEN: None  Drains: None   PATIENT DISPOSITION:  PACU - hemodynamically stable.    PLAN OF CARE: Discharge to home after PACU  Brief history:  Carrie Keller is a 75 year old female who has had chronic and progressively increasing left shoulder pain related to severe arthritis with associated rotator cuff dysfunction.  She has previously undergone a right shoulder reverse arthroplasty and has done extremely well.  Due to her increasing left shoulder pain and functional limitation she is brought to the operating room at this time for planned left shoulder reverse arthroplasty.  Preoperatively, I counseled the patient regarding treatment options and risks versus benefits thereof.  Possible surgical complications were all reviewed including potential for bleeding, infection, neurovascular injury, persistent pain, loss of motion, anesthetic complication, failure of the implant, and possible need for additional surgery. They understand and accept and agrees with our planned procedure.   Procedure in detail:  After undergoing routine preop evaluation the patient received prophylactic antibiotics and interscalene block with Exparel was established in the holding area by anesthesia department.  The patient was subsequently placed supine on the operating table and underwent the smooth induction of a general endotracheal anesthesia.  Placed into the beachchair  position and appropriately padded and protected.  The left shoulder girdle region was sterilely prepped and draped in standard fashion.  Timeout was called.  A deltopectoral approach to the left shoulder was made through an approximately 8 cm incision.  Skin flaps were elevated dissection carried deeply and the deltopectoral interval was developed from proximal distal with the vein taken laterally.  Upper centimeter pectoralis major was tenotomized for exposure the conjoined tendon was mobilized and retracted medially.  The long head biceps tendon was then tenodesed at the upper border of the pectoralis major tendon and the proximal segment was unroofed and excised.  The rotator cuff was split along the rotator interval to the base of the coracoid and the subscapularis was then separated from the lesser tuberosity using electrocautery and was tagged with a pair of suture tape sutures.  Capsular attachments from the anterior and medial margins of the humeral neck were then elevated in a subperiosteal fashion and the humeral head was then delivered through the wound.  An oscillating saw was then used to perform a humeral head resection at approximate 20 degrees of retroversion based on the alignment from her extra medullary alignment guide.  Peripheral osteophytes in the neck of the humerus were removed with rongeurs and a metal cap was then placed over the cut proximal humeral surface.  We then exposed the glenoid with appropriate retractors and performed a circumferential labral resection.  A guidepin was then directed into the center of the glenoid with an approximately 10 degree inferior tilt and the glenoid was then reamed with the central followed by the peripheral reamers to a stable subchondral bony bed.  All debris was then carefully removed in preparation completed with the central drill and tapped.  A 25 mm lag screw was selected  and the baseplate was assembled and inserted with vancomycin powder applied to  the threads of the lag screw and excellent bony purchase and fixation was achieved.  All of the peripheral locking screws were then placed using standard technique.  A 36/+4 glenosphere was then impacted onto the baseplate and a central locking screw was placed.  We then returned our attention to the proximal humerus where the canal was opened by hand reaming and we broached to a size 5.5 which showed excellent fixation and this was performed at approximately 20 degrees retroversion.  A central metaphyseal reaming guide was then utilized to prepare the metaphysis.  A trial implant was placed with trial reduction showed good motion good stability good soft tissue balance.  Our trial was then removed and the final implant was assembled.  Vancomycin powder was spread into the humeral canal after it had been irrigated cleaned and dried.  The final implant was then seated with excellent fit and fixation.  This point a series of trial reductions demonstrated the +6 poly to give [place] the best motion stability and soft tissue balance.  The trial polyp was removed the final polyp was impacted and final reduction was then performed.  We are very pleased with overall motion, stability, and soft tissue balance.  The wound was copiously irrigated.  We confirmed good elasticity of the subscapularis and was then repaired back to the eyelets on the color of her implant using the previously placed suture tape sutures.  The balance of the vancomycin powder was then spread liberally throughout the deep soft tissue planes.  The arm easily achieved 45 degrees of external rotation without excessive tension on the subscap repair.  The deltopectoral interval was reapproximated with a series of figure-of-eight #1 Vicryl sutures.  2-0 Monocryl used for subcu layer and intracuticular 3-0 Monocryl for the skin followed by Dermabond and Aquacel dressing.  The left arm was then placed into a sling.  The patient was awakened, extubated, and taken  to recovery room in stable condition.  Jenetta Loges, PA-C was utilized as an Environmental consultant throughout this case, essential for help with positioning the patient, positioning extremity, tissue manipulation, implantation of the prosthesis, suture management, wound closure, and intraoperative decision-making.  Marin Shutter MD   Contact # [phone]

## 2020-12-08 NOTE — Plan of Care (Signed)
  Problem: Education: Goal: Knowledge of General Education information will improve Description: Including pain rating scale, medication(s)/side effects and non-pharmacologic comfort measures 12/08/2020 1611 by Olen Cordial, RN Outcome: Progressing 12/08/2020 1611 by Olen Cordial, RN Outcome: Progressing   Problem: Activity: Goal: Risk for activity intolerance will decrease Outcome: Progressing   Problem: Pain Managment: Goal: General experience of comfort will improve Outcome: Progressing

## 2020-12-08 NOTE — Anesthesia Postprocedure Evaluation (Signed)
Anesthesia Post Note  Patient: Carrie Keller  Procedure(s) Performed: REVERSE SHOULDER ARTHROPLASTY (Left: Shoulder)     Patient location during evaluation: PACU Anesthesia Type: General and Regional Level of consciousness: awake and alert Pain management: pain level controlled Vital Signs Assessment: post-procedure vital signs reviewed and stable Respiratory status: spontaneous breathing, nonlabored ventilation, respiratory function stable and patient connected to nasal cannula oxygen Cardiovascular status: blood pressure returned to baseline and stable Postop Assessment: no apparent nausea or vomiting Anesthetic complications: no   No notable events documented.  Last Vitals:  Vitals:   12/08/20 1515 12/08/20 1530  BP: 94/62 101/60  Pulse: 77 74  Resp: 15 16  Temp:    SpO2: 93% 94%    Last Pain:  Vitals:   12/08/20 1415  TempSrc:   PainSc: 0-No pain                 Barnet Glasgow

## 2020-12-08 NOTE — Progress Notes (Signed)
AssistedDr. Valma Cava with left, ultrasound guided, interscalene  block. Side rails up, monitors on throughout procedure. See vital signs in flow sheet. Tolerated Procedure well.

## 2020-12-09 ENCOUNTER — Encounter (HOSPITAL_COMMUNITY): Payer: Self-pay | Admitting: Orthopedic Surgery

## 2020-12-09 DIAGNOSIS — M19012 Primary osteoarthritis, left shoulder: Secondary | ICD-10-CM | POA: Diagnosis not present

## 2020-12-09 MED ORDER — CYCLOBENZAPRINE HCL 10 MG PO TABS: 10.0000 mg | ORAL_TABLET | Freq: Three times a day (TID) | ORAL | 1 refills | Status: AC | PRN

## 2020-12-09 MED ORDER — CELECOXIB 200 MG PO CAPS: 200.0000 mg | ORAL_CAPSULE | Freq: Every day | ORAL | 2 refills | Status: AC

## 2020-12-09 MED ORDER — HYDROMORPHONE HCL 2 MG PO TABS: 2.0000 mg | ORAL_TABLET | ORAL | 0 refills | Status: AC | PRN

## 2020-12-09 MED ORDER — ONDANSETRON HCL 4 MG PO TABS: 4.0000 mg | ORAL_TABLET | Freq: Three times a day (TID) | ORAL | 0 refills | Status: AC | PRN

## 2020-12-09 NOTE — Evaluation (Addendum)
Occupational Therapy Evaluation Patient Details Name: Carrie Keller MRN: [id number] DOB: 1945-07-10 Today's Date: 12/09/2020    History of Present Illness Patient is a 75 year old female s/p L reverse total shoulder arthroplasty. PMH includes R shoulder replacement, neuropathy, spinal fusion, seizures   Clinical Impression   Patient is a 75 year old female s/p shoulder replacement without functional use of left non-dominant upper extremity secondary to effects of surgery and interscalene block and shoulder precautions. Therapist provided education and instruction to patient in regards to exercises, precautions, positioning, donning upper extremity clothing and bathing while maintaining shoulder precautions, ice and edema management and donning/doffing sling. Patient verbalized understanding and demonstrated as needed. Patient needed assistance to donn shirt, underwear, pants, socks and shoes and provided with instruction on compensatory strategies to perform ADLs. Patient to follow up with MD for further therapy needs.      Follow Up Recommendations  Follow surgeon's recommendation for DC plan and follow-up therapies    Equipment Recommendations  None recommended by OT       Precautions / Restrictions Precautions Precautions: Shoulder Type of Shoulder Precautions: If sitting in controlled environment, ok to come out of sling to give neck a break. Please sleep in it to protect until follow up in office.OK to use operative arm for feeding, hygiene and ADLs. Ok to instruct Pendulums and lap slides as exercises. Ok to use operative arm within the following parameters for ADL purposes Ok for PROM, AAROM, AROM within pain tolerance and within the following ROM ER 20 ABD 45 FE 60. AROM elbow, wrist, hand ok Shoulder Interventions: Shoulder sling/immobilizer;Off for dressing/bathing/exercises Precaution Booklet Issued: Yes (comment) Required Braces or Orthoses: Sling Restrictions Weight Bearing  Restrictions: Yes LUE Weight Bearing: Non weight bearing      Mobility Bed Mobility Overal bed mobility: Modified Independent             General bed mobility comments: HOB elevated    Transfers Overall transfer level: Independent                    Balance Overall balance assessment: Mild deficits observed, not formally tested                                         ADL either performed or assessed with clinical judgement   ADL Overall ADL's : Needs assistance/impaired Eating/Feeding: Independent   Grooming: Independent   Upper Body Bathing: Minimal assistance;Sitting;Standing   Lower Body Bathing: Minimal assistance;Sitting/lateral leans;Sit to/from stand Lower Body Bathing Details (indicate cue type and reason): to reach L buttock Upper Body Dressing : Minimal assistance;Sitting Upper Body Dressing Details (indicate cue type and reason): to thread L UE due to nerve block Lower Body Dressing: Minimal assistance;Sitting/lateral leans;Sit to/from stand Lower Body Dressing Details (indicate cue type and reason): to pull clothes over L hip Toilet Transfer: Independent;Ambulation   Toileting- Clothing Manipulation and Hygiene: Minimal assistance;Sit to/from stand Toileting - Clothing Manipulation Details (indicate cue type and reason): to pull clothes over L hip     Functional mobility during ADLs: Independent General ADL Comments: patient educated in compensatory strategies for self care tasks in order to maintain shoulder precautions. verbalize understanding.      Pertinent Vitals/Pain Pain Assessment: Faces Faces Pain Scale: Hurts a little bit Pain Location: L UE Pain Descriptors / Indicators: Heaviness;Numbness Pain Intervention(s): Monitored during session  Hand Dominance Right   Extremity/Trunk Assessment Upper Extremity Assessment Upper Extremity Assessment: LUE deficits/detail LUE Deficits / Details: + nerve block    Lower Extremity Assessment Lower Extremity Assessment: Overall WFL for tasks assessed       Communication Communication Communication: No difficulties   Cognition Arousal/Alertness: Awake/alert Behavior During Therapy: WFL for tasks assessed/performed Overall Cognitive Status: Within Functional Limits for tasks assessed                                        Exercises Exercises: Shoulder   Shoulder Instructions Shoulder Instructions Donning/doffing shirt without moving shoulder: Minimal assistance;Patient able to independently direct caregiver Method for sponge bathing under operated UE: Minimal assistance;Patient able to independently direct caregiver Donning/doffing sling/immobilizer: Moderate assistance;Patient able to independently direct caregiver Correct positioning of sling/immobilizer: Patient able to independently direct caregiver;Independent Pendulum exercises (written home exercise program): Patient able to independently direct caregiver ROM for elbow, wrist and digits of operated UE: Patient able to independently direct caregiver Sling wearing schedule (on at all times/off for ADL's): Patient able to independently direct caregiver Proper positioning of operated UE when showering: Patient able to independently direct caregiver Dressing change:  (N/A) Positioning of UE while sleeping: Patient able to independently direct caregiver    Home Living Family/patient expects to be discharged to:: Private residence Living Arrangements: Alone Available Help at Discharge: Friend(s);Available PRN/intermittently Type of Home: Apartment Home Access: Level entry     Home Layout: One level     Bathroom Shower/Tub: Teacher, early years/pre: Standard     Home Equipment: Cane - single point;Walker - 2 wheels   Additional Comments: Patient has a friend that lives in her apartment complex that plans to check in frequently and help first few days while  nerve block still active      Prior Functioning/Environment Level of Independence: Independent                 OT Problem List: Pain;Impaired UE functional use;Decreased knowledge of precautions         OT Goals(Current goals can be found in the care plan section) Acute Rehab OT Goals Patient Stated Goal: home today OT Goal Formulation: All assessment and education complete, DC therapy   AM-PAC OT "6 Clicks" Daily Activity     Outcome Measure Help from another person eating meals?: None Help from another person taking care of personal grooming?: None Help from another person toileting, which includes using toliet, bedpan, or urinal?: A Little Help from another person bathing (including washing, rinsing, drying)?: A Little Help from another person to put on and taking off regular upper body clothing?: A Little Help from another person to put on and taking off regular lower body clothing?: A Little 6 Click Score: 20   End of Session Nurse Communication: Other (comment) (OT complete)  Activity Tolerance: Patient tolerated treatment well Patient left: with call bell/phone within reach (seated EOB)  OT Visit Diagnosis: Pain Pain - Right/Left: Left Pain - part of body: Shoulder                Time: [id number] OT Time Calculation (min): 32 min Charges:  OT General Charges $OT Visit: 1 Visit OT Evaluation $OT Eval Low Complexity: 1 Low OT Treatments $Self Care/Home Management : 8-22 mins  Delbert Phenix OT OT pager: Dwight [place] 12/09/2020, 10:12 AM

## 2020-12-09 NOTE — Discharge Summary (Signed)
PATIENT ID:      Carrie Keller  MRN:     [id number] DOB/AGE:    [birth year] / 75 y.o.     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   Peripheral vascular disease ([facility])    venous insufficiency/ bilateral legs   Plantar fasciitis    LEFT   PONV (postoperative nausea and vomiting)    NO PROBLEMS WITH N&V LAST COUPLE OF SURGERIES   Pulmonary embolism ([facility]) 2008   Restless leg syndrome    Scoliosis    Seizures ([facility])    last seizure 1967- states reaction to COMPAZINE   Shortness of breath    WITH EXERTION    DISCHARGE DIAGNOSIS:   Active Problems:   S/P reverse total shoulder arthroplasty, left   PROCEDURE: Procedure(s): REVERSE SHOULDER ARTHROPLASTY on 12/08/2020  CONSULTS:    HISTORY:  See H&P in chart.  HOSPITAL COURSE:  Carrie Keller is a 75 y.o. admitted on 12/08/2020 with a diagnosis of Left shoulder osteoarthritis.  They were brought to the operating room on 12/08/2020 and underwent Procedure(s): [place].    They were given perioperative antibiotics:  Anti-infectives (From admission, onward)    Start     Dose/Rate Route Frequency Ordered Stop   12/08/20 1030  ceFAZolin (ANCEF) 2 g in sodium chloride 0.9 % 100 mL IVPB        2 g 200 mL/hr over 30 Minutes Intravenous On call to O.R. 12/08/20 1018 12/08/20 1248   12/08/20  1018  vancomycin (VANCOCIN) IVPB 1000 mg/200 mL premix        1,000 mg 200 mL/hr over 60 Minutes Intravenous 60 min pre-op 12/08/20 1018 12/08/20 1402     .  Patient underwent the above named procedure and tolerated it well. The following day they were hemodynamically stable and pain was controlled on oral analgesics. They were neurovascularly intact to the operative extremity. OT was ordered and worked with patient per protocol. They were medically and orthopaedically stable for discharge on .    DIAGNOSTIC STUDIES:  RECENT RADIOGRAPHIC STUDIES :  No results found.  RECENT VITAL SIGNS:  Patient Vitals for the past 24 hrs:  BP Temp Temp src Pulse Resp SpO2 Height Weight  12/09/20 0438 115/71 97.7 F (36.5 C) Oral 76 (!) 24 96 % -- --  12/09/20 0043 (!) 105/55 97.9 F (36.6 C) Oral 82 16 91 % -- --  12/08/20 2113 103/63 97.9 F (36.6 C) Oral 86 20 92 % -- --  12/08/20 1808 114/69 (!) 97.5 F (36.4 C) Oral 79 17 95 % -- --  12/08/20 1613 121/67 98 F (36.7 C) Oral 74 16 98 % '5\' 2"'$  (1.575 m) 68 kg  12/08/20 1530 101/60 -- -- 74 16 94 % -- --  12/08/20 1515 94/62 -- -- 77 15 93 % -- --  12/08/20 1500 119/65 -- -- 79 16 91 % -- --  12/08/20 1445 113/66 -- -- 77 14 94 % -- --  12/08/20 1430 109/64 -- -- 82 18 93 % -- --  12/08/20 1415 115/65 -- -- 88 20 94 % -- --  12/08/20 1404 114/66 (!) 97.5 F (36.4 C) -- 84 13 96 % -- --  12/08/20 1028 123/76 98.1 F (36.7 C) Oral 74 (!) 21 93 % -- --  .  RECENT EKG RESULTS:    Orders placed or performed during the hospital encounter of 09/03/20   EKG 12-Lead   EKG 12-Lead   EKG 12-Lead   EKG 12-Lead    DISCHARGE INSTRUCTIONS:    DISCHARGE MEDICATIONS:   Allergies as of 12/09/2020       Reactions   Compazine [prochlorperazine Edisylate] Other (See Comments)   Pt reports to having torticollis symptoms and seizure activity with compazine   Phenergan [promethazine] Other (See Comments)   Seizures         Medication List      TAKE these medications    albuterol 108 (90 Base) MCG/ACT inhaler Commonly known as: VENTOLIN HFA Inhale 2 puffs into the lungs every 6 (six) hours as needed for wheezing or shortness of breath.   celecoxib 200 MG capsule Commonly known as: CeleBREX Take 1 capsule (200 mg total) by mouth daily.   cetirizine 10 MG tablet Commonly known as: ZYRTEC Take 10 mg by mouth daily.   cyclobenzaprine 10 MG tablet Commonly known as: FLEXERIL Take 1 tablet (10 mg total) by mouth 3 (three) times daily as needed for muscle spasms.   ezetimibe 10 MG tablet Commonly known as: ZETIA Take 10 mg by mouth daily.   gabapentin 300 MG capsule Commonly known as: NEURONTIN TAKE 1 CAPSULE (300 MG TOTAL) BY MOUTH 3 (THREE) TIMES DAILY.   HYDROmorphone 2 MG tablet Commonly known as: Dilaudid Take 1 tablet (2 mg total) by mouth every 4 (four) hours as needed.   hydrOXYzine 50 MG capsule Commonly known as: VISTARIL Take 50 mg by mouth 2 (two) times daily.   ondansetron 4 MG tablet Commonly known as: Zofran Take 1 tablet (4 mg total) by mouth every 8 (eight) hours as needed for nausea or vomiting.   oxybutynin 10 MG 24 hr tablet Commonly known as: DITROPAN-XL Take 10 mg by mouth at bedtime.   pantoprazole 40 MG tablet Commonly known as: PROTONIX Take 40 mg by mouth daily.   polyethylene glycol 17 g packet Commonly known as: MIRALAX / GLYCOLAX Take 17 g by mouth daily as needed for mild constipation.   pramipexole 1 MG tablet Commonly known as: MIRAPEX Take 2 mg by mouth at bedtime.   pravastatin 80 MG tablet Commonly known as: PRAVACHOL Take 80 mg by mouth daily.   sertraline 50 MG tablet Commonly known as: ZOLOFT Take 50 mg by mouth daily.   traZODone 100 MG tablet Commonly known as: DESYREL Take 100 mg by mouth at bedtime.        FOLLOW UP VISIT:    Follow-up Information     Justice Britain, MD Follow up.   Specialty: Orthopedic Surgery Why: on 8/8 at 10:30 Contact  information: [address] [place] [address] [phone]  DISCHARGE TO: home   DISCHARGE CONDITION:  Thereasa Parkin Alianna Wurster for Dr. Justice Britain 12/09/2020, 8:01 AM

## 2021-07-05 ENCOUNTER — Other Ambulatory Visit: Payer: Self-pay | Admitting: Internal Medicine

## 2021-07-05 DIAGNOSIS — R921 Mammographic calcification found on diagnostic imaging of breast: Secondary | ICD-10-CM

## 2021-07-14 ENCOUNTER — Ambulatory Visit
Admission: RE | Admit: 2021-07-14 | Discharge: 2021-07-14 | Disposition: A | Discharge status: Home / Self Care | Payer: Medicare Other | Source: Ambulatory Visit | Attending: Internal Medicine | Admitting: Internal Medicine

## 2021-07-14 DIAGNOSIS — N641 Fat necrosis of breast: Secondary | ICD-10-CM | POA: Diagnosis not present

## 2021-07-14 DIAGNOSIS — R921 Mammographic calcification found on diagnostic imaging of breast: Secondary | ICD-10-CM

## 2021-07-24 DIAGNOSIS — R1310 Dysphagia, unspecified: Secondary | ICD-10-CM | POA: Diagnosis not present

## 2022-07-02 IMAGING — CR DG CHEST 2V
2 series · 2 of 2 positions shown · non-contrast
Comparison: August 03, 2018

CLINICAL DATA: Medical clearance.

EXAM:
CHEST - 2 VIEW

[w chest lat]
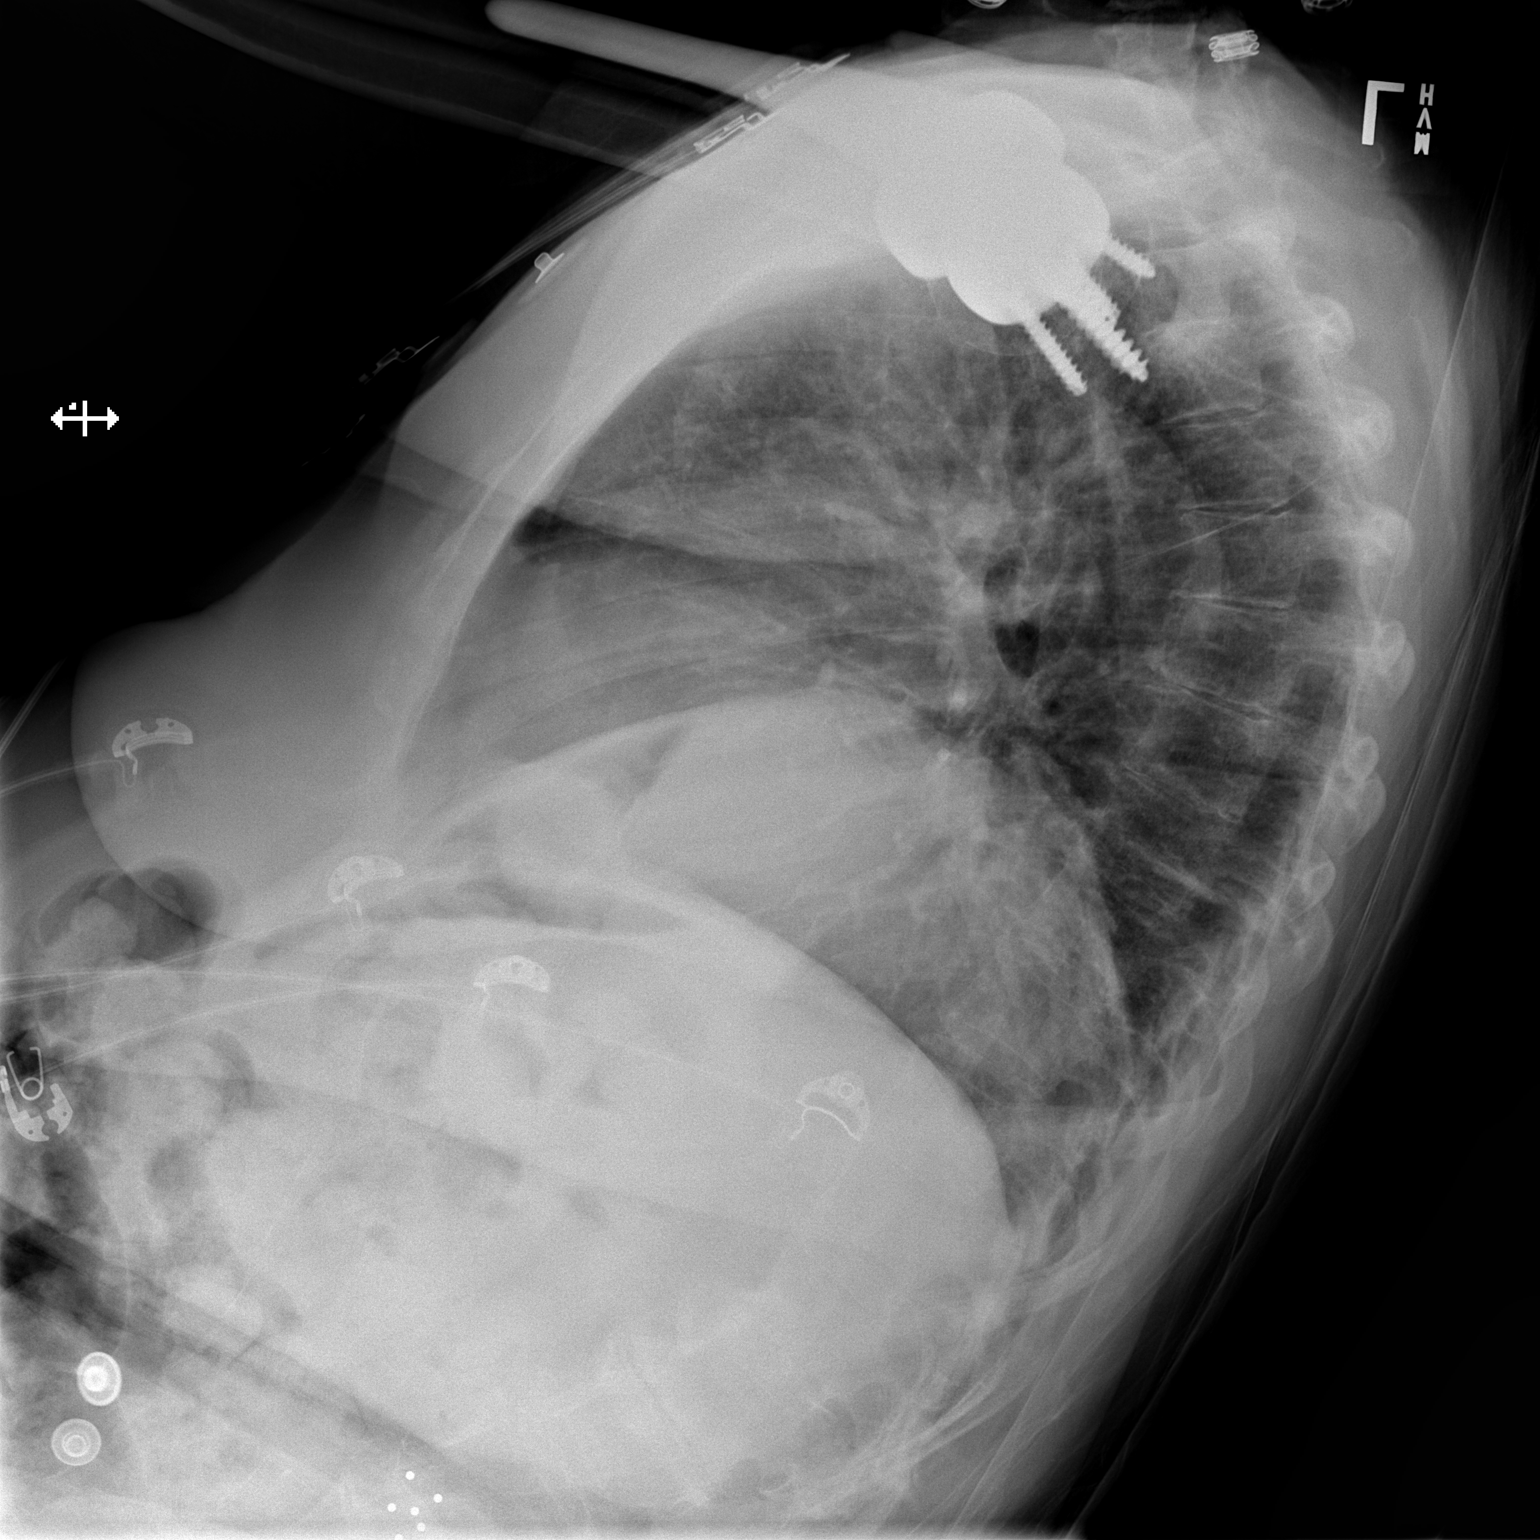

[x chest ap]
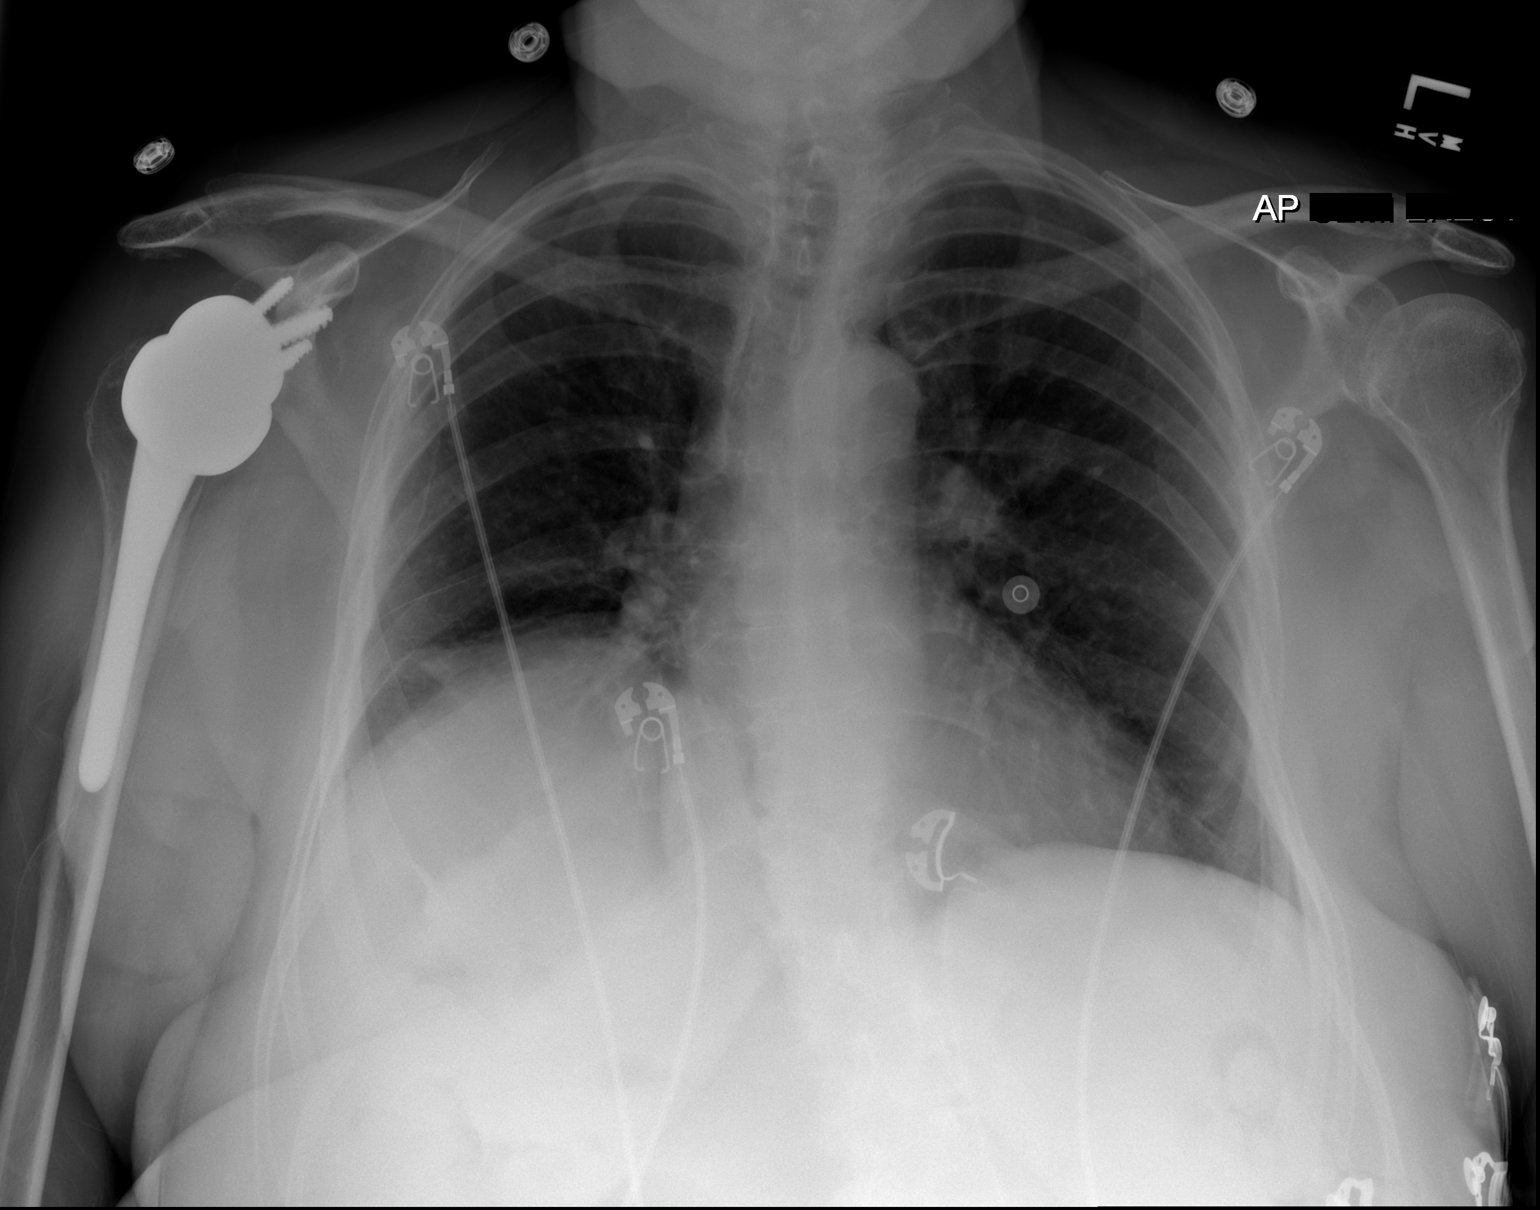

[2 of 2 positions shown; findings below may reference images not displayed]

FINDINGS: Mildly decreased lung volumes are seen with stable elevation of the
right hemidiaphragm. Mild atelectasis is seen within the right lung
base. There is no evidence of a pleural effusion or pneumothorax.
The heart size and mediastinal contours are within normal limits.
The spinal stimulator wire seen on the prior study has been removed.
A radiopaque right shoulder replacement is noted.
IMPRESSION: Mild right basilar atelectasis.

## 2022-10-26 DIAGNOSIS — M461 Sacroiliitis, not elsewhere classified: Secondary | ICD-10-CM | POA: Diagnosis not present

## 2022-10-30 DIAGNOSIS — G4733 Obstructive sleep apnea (adult) (pediatric): Secondary | ICD-10-CM | POA: Diagnosis not present

## 2022-10-30 DIAGNOSIS — Z6830 Body mass index (BMI) 30.0-30.9, adult: Secondary | ICD-10-CM | POA: Diagnosis not present

## 2022-10-30 DIAGNOSIS — E669 Obesity, unspecified: Secondary | ICD-10-CM | POA: Diagnosis not present

## 2022-10-30 DIAGNOSIS — F419 Anxiety disorder, unspecified: Secondary | ICD-10-CM | POA: Diagnosis not present

## 2022-10-30 DIAGNOSIS — K219 Gastro-esophageal reflux disease without esophagitis: Secondary | ICD-10-CM | POA: Diagnosis not present

## 2022-10-30 DIAGNOSIS — M461 Sacroiliitis, not elsewhere classified: Secondary | ICD-10-CM | POA: Diagnosis not present

## 2022-10-30 DIAGNOSIS — G2581 Restless legs syndrome: Secondary | ICD-10-CM | POA: Diagnosis not present

## 2022-10-30 DIAGNOSIS — Z888 Allergy status to other drugs, medicaments and biological substances status: Secondary | ICD-10-CM | POA: Diagnosis not present

## 2022-10-30 DIAGNOSIS — I10 Essential (primary) hypertension: Secondary | ICD-10-CM | POA: Diagnosis not present

## 2022-10-30 DIAGNOSIS — G4700 Insomnia, unspecified: Secondary | ICD-10-CM | POA: Diagnosis not present

## 2022-10-30 DIAGNOSIS — E785 Hyperlipidemia, unspecified: Secondary | ICD-10-CM | POA: Diagnosis not present

## 2022-10-30 DIAGNOSIS — Z79891 Long term (current) use of opiate analgesic: Secondary | ICD-10-CM | POA: Diagnosis not present

## 2022-10-30 DIAGNOSIS — R32 Unspecified urinary incontinence: Secondary | ICD-10-CM | POA: Diagnosis not present

## 2023-10-09 IMAGING — MG MM BREAST BX W/ LOC DEV 1ST LESION IMAGE BX SPEC STEREO GUIDE*R*
8 of 10 series · 8 of 18 positions shown · non-contrast
Comparison: Previous exams.
COMPARISON: Previous exams.

Addendum:
CLINICAL DATA: 75-year-old female presenting for biopsy of
calcifications in the right breast.

EXAM:
RIGHT BREAST STEREOTACTIC CORE NEEDLE BIOPSY

[R (1 of 7)]
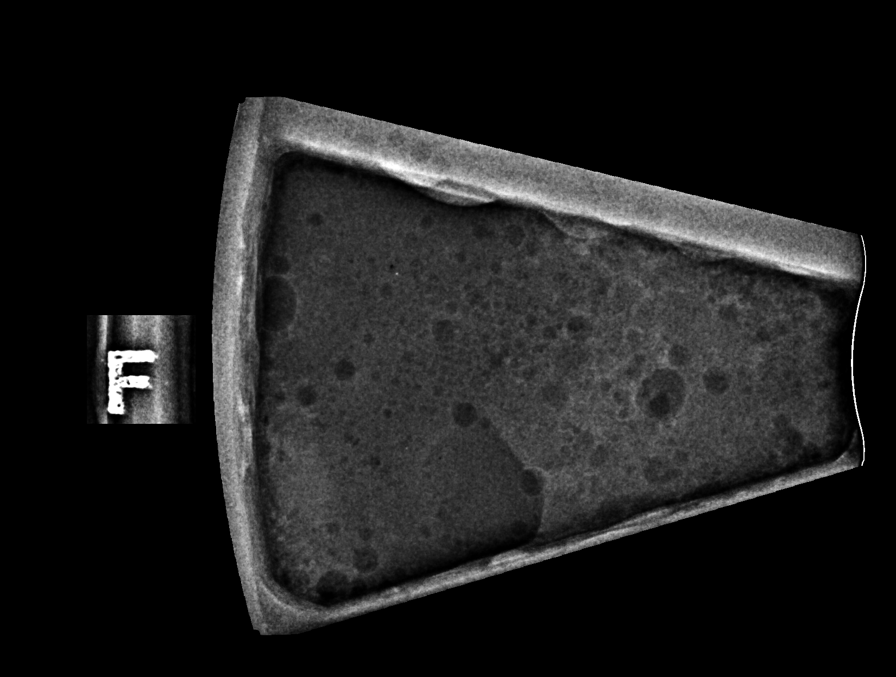

[R (2 of 7)]
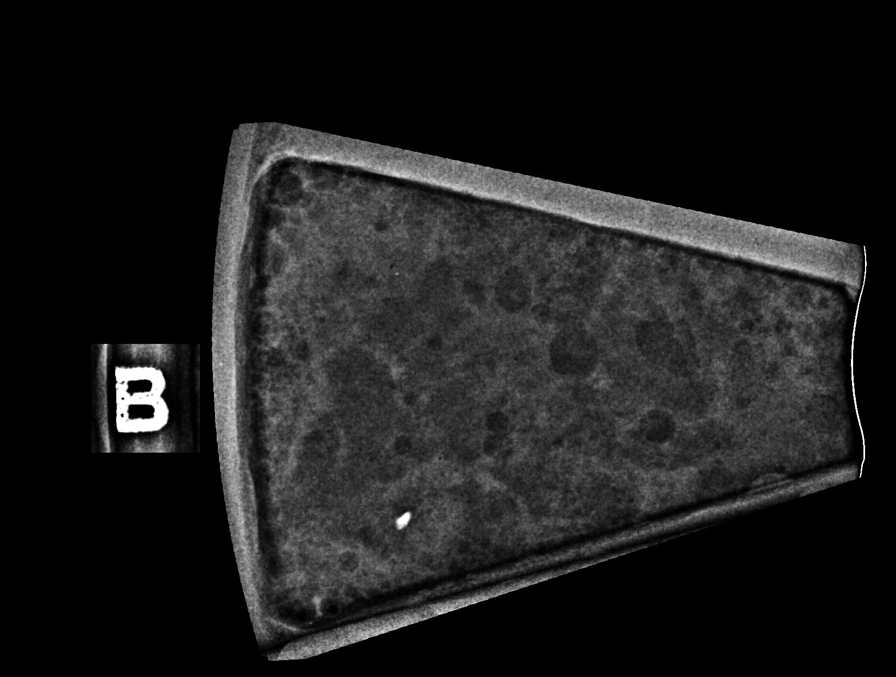

[R (3 of 7)]
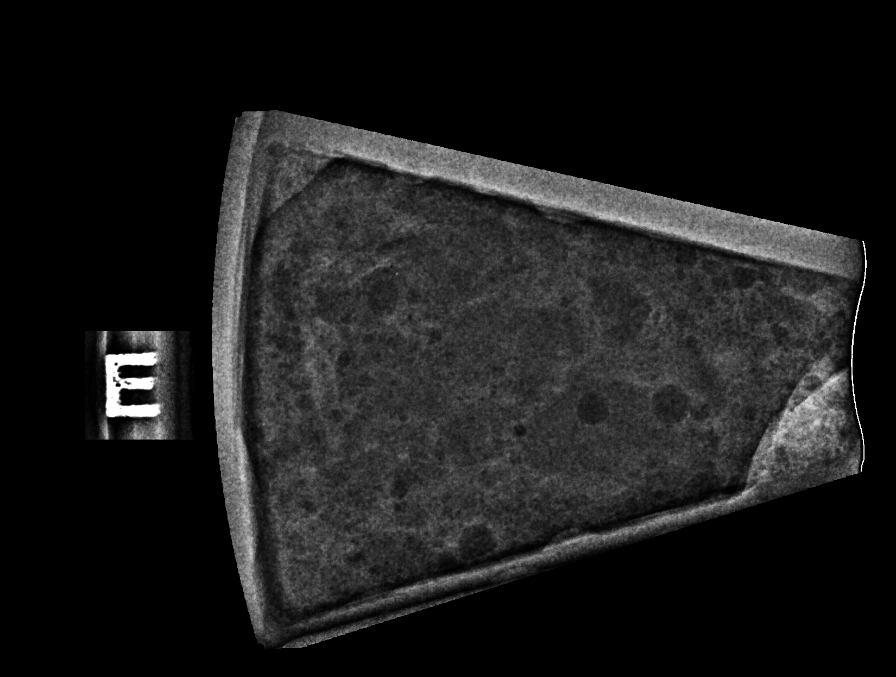

[R (4 of 7)]
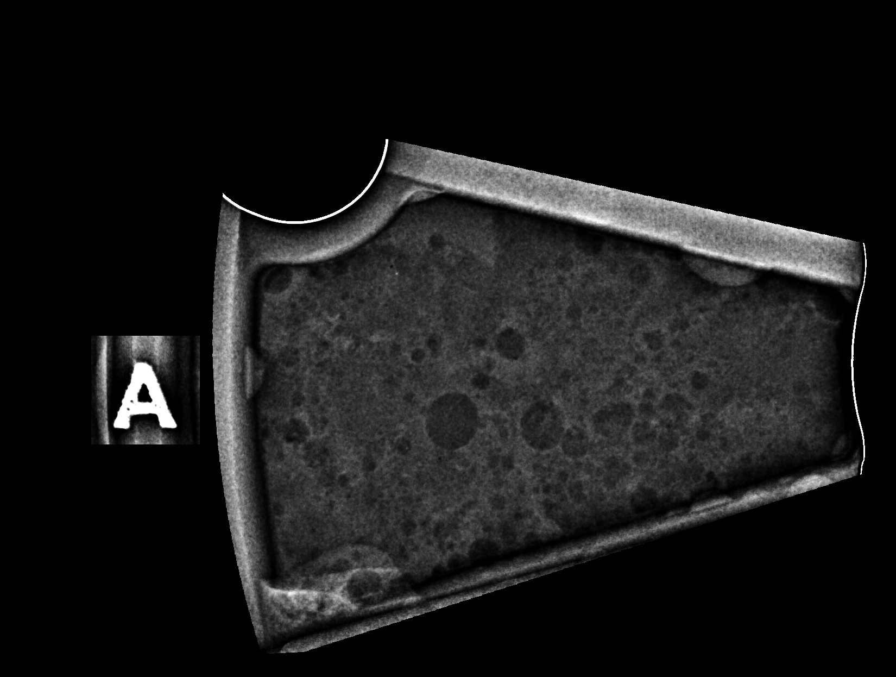

[R (5 of 7)]
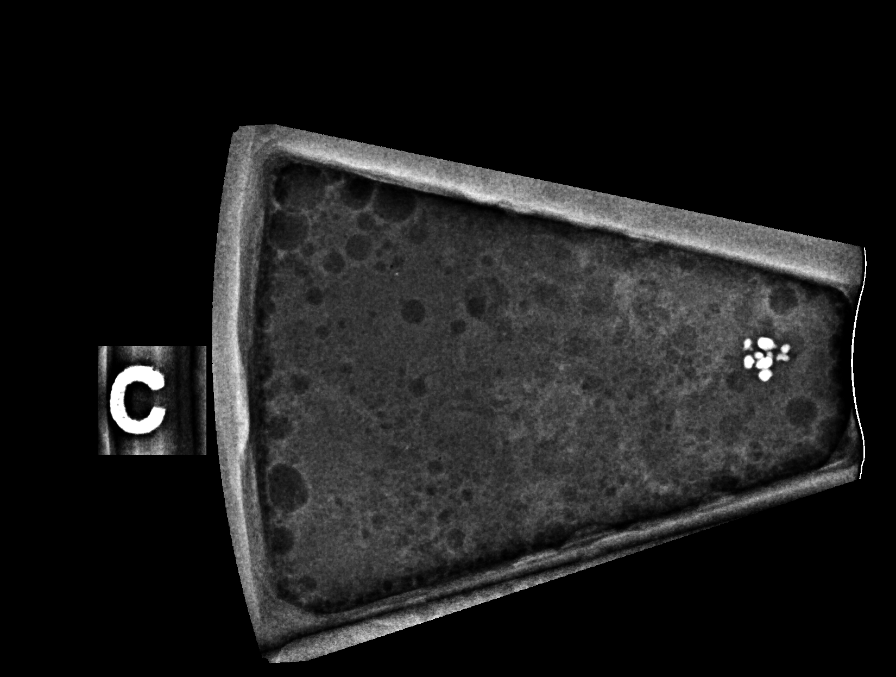

[R (6 of 7)]
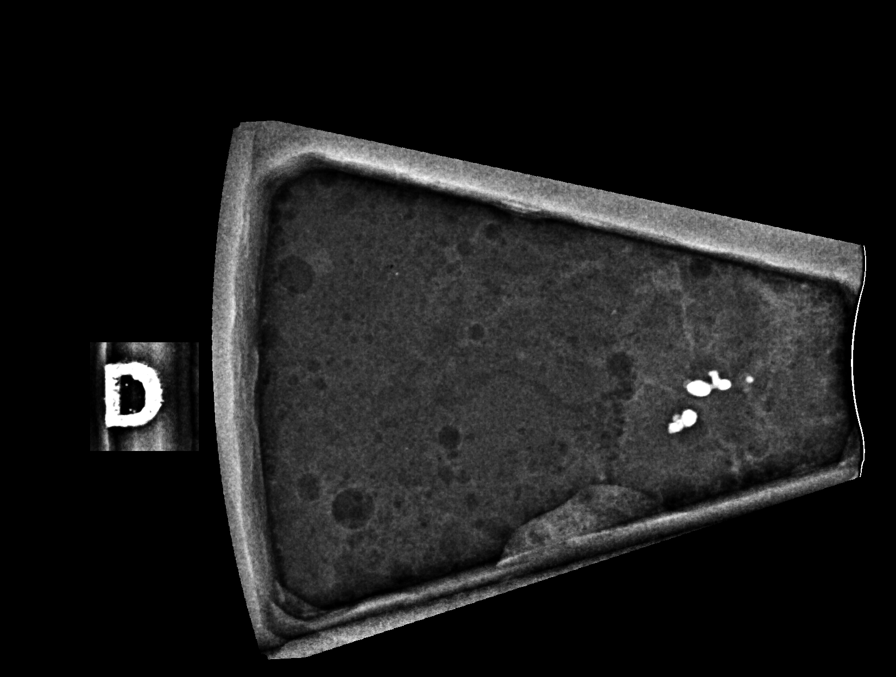

[R (7 of 7)]
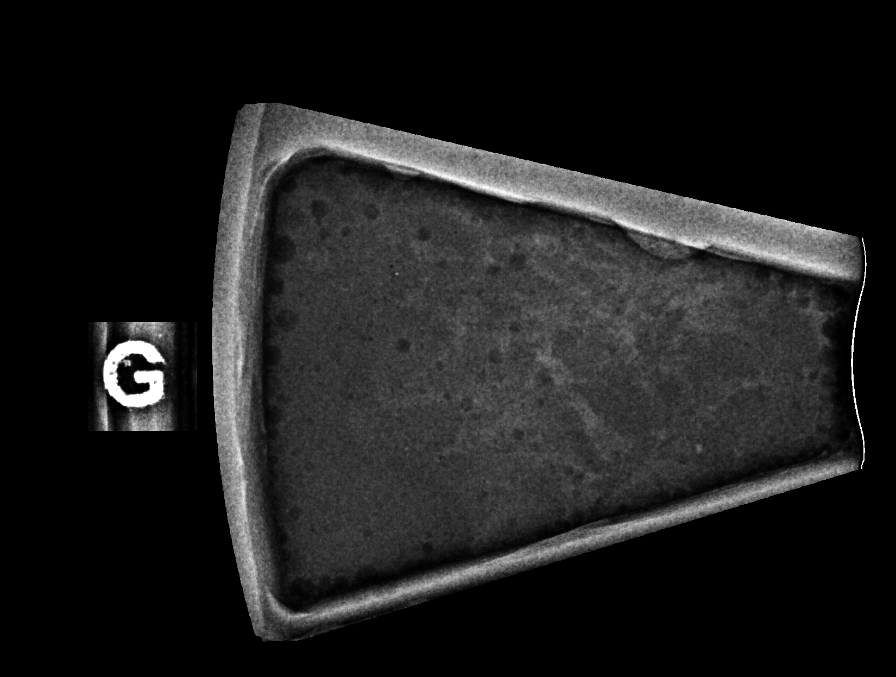

[R CC]
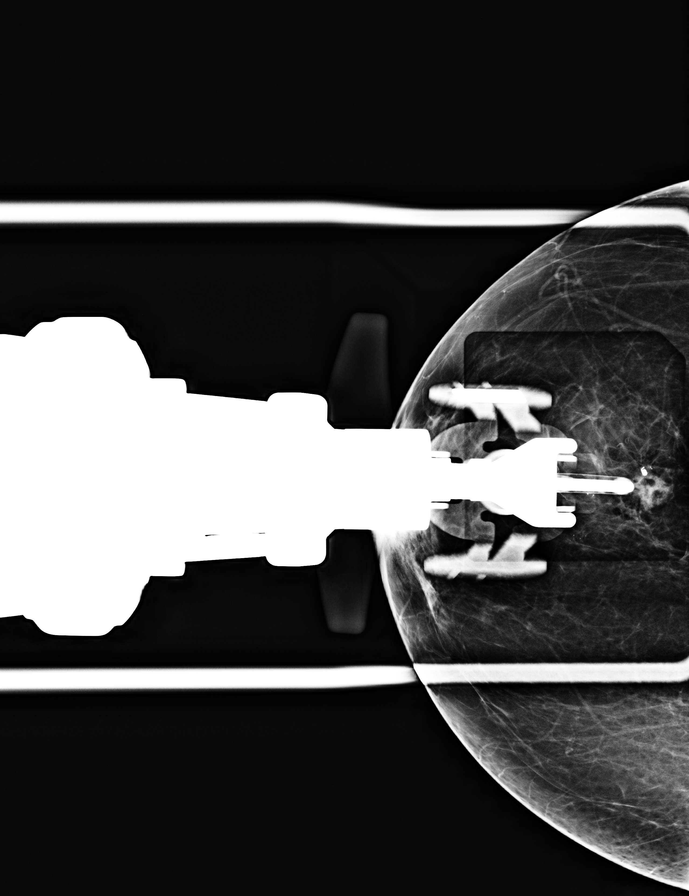

[8 of 18 positions shown; findings below may reference images not displayed]



Using sterile technique and 1% Lidocaine as local anesthetic, under
stereotactic guidance, a 9 gauge vacuum assisted device was used to
perform core needle biopsy of calcifications in the superior central
right breast using a superior approach. Specimen radiograph was
performed showing at least 2 specimens with calcifications.
Specimens with calcifications are identified for pathology.

Lesion quadrant: Upper outer quadrant

At the conclusion of the procedure, a coil shaped tissue marker clip
was deployed into the biopsy cavity. Follow-up 2-view mammogram was
performed and dictated separately.
IMPRESSION: Stereotactic-guided biopsy of calcifications in the superior central
right breast. No apparent complications.

ADDENDUM:
Pathology revealed HEALED FAT NECROSIS WITH DYSTROPHIC
CALCIFICATIONS of the RIGHT breast, superior central, (coil clip).
This was found to be concordant by Dr. Jooyoun Arzola.

Pathology results were discussed with the patient by telephone. The
patient reported doing well after the biopsy with tenderness at the
site. Post biopsy instructions and care were reviewed and questions
were answered. The patient was encouraged to call The [REDACTED] for any additional concerns. My direct phone
number was provided.

The patient was instructed to return for RIGHT diagnostic
mammography in 6 months at [REDACTED] Mammography.

Pathology results reported by Erniuska, RN on 07/17/2021.



Using sterile technique and 1% Lidocaine as local anesthetic, under
stereotactic guidance, a 9 gauge vacuum assisted device was used to
perform core needle biopsy of calcifications in the superior central
right breast using a superior approach. Specimen radiograph was
performed showing at least 2 specimens with calcifications.
Specimens with calcifications are identified for pathology.

Lesion quadrant: Upper outer quadrant

At the conclusion of the procedure, a coil shaped tissue marker clip
was deployed into the biopsy cavity. Follow-up 2-view mammogram was
performed and dictated separately.
IMPRESSION: Stereotactic-guided biopsy of calcifications in the superior central
right breast. No apparent complications.

## 2024-03-13 DIAGNOSIS — R079 Chest pain, unspecified: Secondary | ICD-10-CM | POA: Diagnosis not present
